# Patient Record
Sex: Female | Born: 1985 | Race: Black or African American | Hispanic: No | Marital: Single | Smoking: Never smoker
Health system: Southern US, Community
[De-identification: ages and names within clinical notes are randomized; demographics above are authoritative.]

## PROBLEM LIST (undated history)

## (undated) ENCOUNTER — Inpatient Hospital Stay: Payer: Self-pay

## (undated) DIAGNOSIS — T7840XA Allergy, unspecified, initial encounter: Secondary | ICD-10-CM

## (undated) DIAGNOSIS — K2970 Gastritis, unspecified, without bleeding: Secondary | ICD-10-CM

## (undated) DIAGNOSIS — I10 Essential (primary) hypertension: Secondary | ICD-10-CM

## (undated) HISTORY — PX: CHOLECYSTECTOMY: SHX55

## (undated) HISTORY — DX: Allergy, unspecified, initial encounter: T78.40XA

## (undated) HISTORY — PX: APPENDECTOMY: SHX54

---
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
## 2006-05-10 DIAGNOSIS — O1490 Unspecified pre-eclampsia, unspecified trimester: Secondary | ICD-10-CM

## 2006-10-21 ENCOUNTER — Inpatient Hospital Stay: Payer: Self-pay | Admitting: Surgery

## 2007-05-27 ENCOUNTER — Other Ambulatory Visit: Payer: Self-pay

## 2007-05-27 ENCOUNTER — Emergency Department: Payer: Self-pay | Admitting: Emergency Medicine

## 2007-12-12 ENCOUNTER — Emergency Department: Payer: Self-pay | Admitting: Emergency Medicine

## 2009-05-16 ENCOUNTER — Emergency Department: Payer: Self-pay | Admitting: Emergency Medicine

## 2009-08-08 ENCOUNTER — Emergency Department: Payer: Self-pay | Admitting: Internal Medicine

## 2009-08-17 ENCOUNTER — Emergency Department: Payer: Self-pay | Admitting: Emergency Medicine

## 2009-11-20 ENCOUNTER — Emergency Department: Payer: Self-pay | Admitting: Emergency Medicine

## 2010-01-13 ENCOUNTER — Emergency Department: Payer: Self-pay | Admitting: Unknown Physician Specialty

## 2010-03-24 ENCOUNTER — Emergency Department: Payer: Self-pay | Admitting: Emergency Medicine

## 2010-04-25 ENCOUNTER — Emergency Department: Payer: Self-pay | Admitting: Emergency Medicine

## 2010-05-20 ENCOUNTER — Emergency Department: Payer: Self-pay | Admitting: Emergency Medicine

## 2010-05-21 ENCOUNTER — Emergency Department: Payer: Self-pay | Admitting: Emergency Medicine

## 2010-05-24 ENCOUNTER — Emergency Department: Payer: Self-pay | Admitting: Emergency Medicine

## 2010-07-10 ENCOUNTER — Emergency Department: Payer: Self-pay | Admitting: Emergency Medicine

## 2010-07-25 ENCOUNTER — Emergency Department: Payer: Self-pay | Admitting: Unknown Physician Specialty

## 2010-07-30 ENCOUNTER — Emergency Department: Payer: Self-pay | Admitting: Emergency Medicine

## 2010-11-01 ENCOUNTER — Emergency Department: Payer: Self-pay | Admitting: Emergency Medicine

## 2011-01-14 ENCOUNTER — Emergency Department: Payer: Self-pay | Admitting: Emergency Medicine

## 2011-01-16 ENCOUNTER — Emergency Department: Payer: Self-pay | Admitting: Emergency Medicine

## 2011-02-16 ENCOUNTER — Emergency Department: Payer: Self-pay | Admitting: Emergency Medicine

## 2011-03-05 ENCOUNTER — Emergency Department: Payer: Self-pay | Admitting: *Deleted

## 2011-03-07 ENCOUNTER — Emergency Department: Payer: Self-pay | Admitting: Emergency Medicine

## 2011-03-08 ENCOUNTER — Emergency Department: Payer: Self-pay | Admitting: Emergency Medicine

## 2011-04-15 ENCOUNTER — Emergency Department: Payer: Self-pay | Admitting: Emergency Medicine

## 2011-05-06 ENCOUNTER — Emergency Department: Payer: Self-pay | Admitting: Emergency Medicine

## 2011-06-01 ENCOUNTER — Emergency Department: Payer: Self-pay | Admitting: *Deleted

## 2011-06-16 ENCOUNTER — Emergency Department: Payer: Self-pay | Admitting: Unknown Physician Specialty

## 2011-07-04 ENCOUNTER — Emergency Department: Payer: Self-pay | Admitting: Unknown Physician Specialty

## 2011-07-08 ENCOUNTER — Emergency Department: Payer: Self-pay | Admitting: *Deleted

## 2011-07-09 ENCOUNTER — Emergency Department: Payer: Self-pay | Admitting: Emergency Medicine

## 2011-07-13 ENCOUNTER — Observation Stay: Payer: Self-pay

## 2011-07-22 ENCOUNTER — Observation Stay: Payer: Self-pay

## 2011-08-02 ENCOUNTER — Emergency Department: Payer: Self-pay | Admitting: *Deleted

## 2011-08-02 LAB — CBC
HCT: 41.9 % (ref 35.0–47.0)
HGB: 14.6 g/dL (ref 12.0–16.0)
MCH: 30.7 pg (ref 26.0–34.0)
MCHC: 35 g/dL (ref 32.0–36.0)
MCV: 88 fL (ref 80–100)
Platelet: 305 10*3/uL (ref 150–440)
RBC: 4.78 10*6/uL (ref 3.80–5.20)
RDW: 13.1 % (ref 11.5–14.5)
WBC: 13.8 10*3/uL — ABNORMAL HIGH (ref 3.6–11.0)

## 2011-08-02 LAB — URINALYSIS, COMPLETE
Bilirubin,UR: NEGATIVE
Blood: NEGATIVE
Glucose,UR: NEGATIVE mg/dL (ref 0–75)
Ketone: NEGATIVE
Nitrite: NEGATIVE
Ph: 6 (ref 4.5–8.0)
Protein: NEGATIVE
RBC,UR: NONE SEEN /HPF (ref 0–5)
Specific Gravity: 1.013 (ref 1.003–1.030)
Squamous Epithelial: 30
WBC UR: 21 /HPF (ref 0–5)

## 2011-08-02 LAB — COMPREHENSIVE METABOLIC PANEL
Albumin: 3.7 g/dL (ref 3.4–5.0)
Alkaline Phosphatase: 47 U/L — ABNORMAL LOW (ref 50–136)
Anion Gap: 12 (ref 7–16)
BUN: 7 mg/dL (ref 7–18)
Bilirubin,Total: 0.3 mg/dL (ref 0.2–1.0)
Calcium, Total: 9.3 mg/dL (ref 8.5–10.1)
Chloride: 102 mmol/L (ref 98–107)
Co2: 25 mmol/L (ref 21–32)
Creatinine: 0.64 mg/dL (ref 0.60–1.30)
EGFR (African American): >60
EGFR (Non-African Amer.): >60
Glucose: 88 mg/dL (ref 65–99)
Osmolality: 275 (ref 275–301)
Potassium: 3.5 mmol/L (ref 3.5–5.1)
SGOT(AST): 45 U/L — ABNORMAL HIGH (ref 15–37)
SGPT (ALT): 44 U/L
Sodium: 139 mmol/L (ref 136–145)
Total Protein: 7.9 g/dL (ref 6.4–8.2)

## 2011-08-02 LAB — HCG, QUANTITATIVE, PREGNANCY: Beta Hcg, Quant.: 84413 m[IU]/mL — ABNORMAL HIGH

## 2011-08-09 ENCOUNTER — Inpatient Hospital Stay: Payer: Self-pay

## 2011-08-09 LAB — COMPREHENSIVE METABOLIC PANEL
Albumin: 3.2 g/dL — ABNORMAL LOW (ref 3.4–5.0)
Alkaline Phosphatase: 40 U/L — ABNORMAL LOW (ref 50–136)
Anion Gap: 11 (ref 7–16)
BUN: 7 mg/dL (ref 7–18)
Bilirubin,Total: 0.3 mg/dL (ref 0.2–1.0)
Calcium, Total: 8.7 mg/dL (ref 8.5–10.1)
Chloride: 101 mmol/L (ref 98–107)
Co2: 25 mmol/L (ref 21–32)
Creatinine: 0.41 mg/dL — ABNORMAL LOW (ref 0.60–1.30)
EGFR (African American): >60
EGFR (Non-African Amer.): >60
Glucose: 87 mg/dL (ref 65–99)
Osmolality: 271 (ref 275–301)
Potassium: 3.7 mmol/L (ref 3.5–5.1)
SGOT(AST): 18 U/L (ref 15–37)
SGPT (ALT): 24 U/L
Sodium: 137 mmol/L (ref 136–145)
Total Protein: 7.2 g/dL (ref 6.4–8.2)

## 2011-08-09 LAB — URINALYSIS, COMPLETE
Bacteria: NONE SEEN
Bilirubin,UR: NEGATIVE
Blood: NEGATIVE
Glucose,UR: NEGATIVE mg/dL (ref 0–75)
Nitrite: NEGATIVE
Ph: 6 (ref 4.5–8.0)
Protein: NEGATIVE
RBC,UR: 1 /HPF (ref 0–5)
Specific Gravity: 1.021 (ref 1.003–1.030)
Squamous Epithelial: 7
WBC UR: 4 /HPF (ref 0–5)

## 2011-08-10 LAB — TSH: Thyroid Stimulating Horm: 0.248 u[IU]/mL — ABNORMAL LOW

## 2011-08-10 LAB — T4, FREE: Free Thyroxine: 0.95 ng/dL (ref 0.76–1.46)

## 2011-08-12 LAB — URINALYSIS, COMPLETE
Bacteria: NONE SEEN
Bilirubin,UR: NEGATIVE
Blood: NEGATIVE
Glucose,UR: NEGATIVE mg/dL (ref 0–75)
Ketone: NEGATIVE
Nitrite: NEGATIVE
Ph: 7 (ref 4.5–8.0)
Protein: NEGATIVE
RBC,UR: 3 /HPF (ref 0–5)
Specific Gravity: 1.004 (ref 1.003–1.030)
Squamous Epithelial: 2
WBC UR: 2 /HPF (ref 0–5)

## 2011-08-25 ENCOUNTER — Emergency Department: Payer: Self-pay | Admitting: Emergency Medicine

## 2011-08-25 LAB — BASIC METABOLIC PANEL
Anion Gap: 12 (ref 7–16)
BUN: 5 mg/dL — ABNORMAL LOW (ref 7–18)
Calcium, Total: 8.8 mg/dL (ref 8.5–10.1)
Chloride: 105 mmol/L (ref 98–107)
Co2: 24 mmol/L (ref 21–32)
Creatinine: 0.56 mg/dL — ABNORMAL LOW (ref 0.60–1.30)
EGFR (African American): >60
EGFR (Non-African Amer.): >60
Glucose: 94 mg/dL (ref 65–99)
Osmolality: 278 (ref 275–301)
Potassium: 3.5 mmol/L (ref 3.5–5.1)
Sodium: 141 mmol/L (ref 136–145)

## 2011-08-25 LAB — CBC
HCT: 35.5 % (ref 35.0–47.0)
HGB: 12.2 g/dL (ref 12.0–16.0)
MCH: 30.1 pg (ref 26.0–34.0)
MCHC: 34.5 g/dL (ref 32.0–36.0)
MCV: 87 fL (ref 80–100)
Platelet: 276 10*3/uL (ref 150–440)
RBC: 4.06 10*6/uL (ref 3.80–5.20)
RDW: 12.8 % (ref 11.5–14.5)
WBC: 9.5 10*3/uL (ref 3.6–11.0)

## 2011-08-25 LAB — URINALYSIS, COMPLETE
Bacteria: NONE SEEN
Bilirubin,UR: NEGATIVE
Blood: NEGATIVE
Glucose,UR: 50 mg/dL (ref 0–75)
Nitrite: NEGATIVE
Ph: 5 (ref 4.5–8.0)
Protein: NEGATIVE
RBC,UR: 1 /HPF (ref 0–5)
Specific Gravity: 1.021 (ref 1.003–1.030)
Squamous Epithelial: 2
WBC UR: 2 /HPF (ref 0–5)

## 2011-08-25 LAB — HCG, QUANTITATIVE, PREGNANCY: Beta Hcg, Quant.: 56880 m[IU]/mL — ABNORMAL HIGH

## 2011-09-03 ENCOUNTER — Emergency Department: Payer: Self-pay | Admitting: Emergency Medicine

## 2011-09-03 LAB — COMPREHENSIVE METABOLIC PANEL
Albumin: 3.3 g/dL — ABNORMAL LOW (ref 3.4–5.0)
Alkaline Phosphatase: 39 U/L — ABNORMAL LOW (ref 50–136)
Anion Gap: 10 (ref 7–16)
BUN: 8 mg/dL (ref 7–18)
Bilirubin,Total: 0.2 mg/dL (ref 0.2–1.0)
Calcium, Total: 9.1 mg/dL (ref 8.5–10.1)
Chloride: 102 mmol/L (ref 98–107)
Co2: 26 mmol/L (ref 21–32)
Creatinine: 0.5 mg/dL — ABNORMAL LOW (ref 0.60–1.30)
EGFR (African American): >60
EGFR (Non-African Amer.): >60
Glucose: 83 mg/dL (ref 65–99)
Osmolality: 273 (ref 275–301)
Potassium: 3.8 mmol/L (ref 3.5–5.1)
SGOT(AST): 13 U/L — ABNORMAL LOW (ref 15–37)
SGPT (ALT): 16 U/L
Sodium: 138 mmol/L (ref 136–145)
Total Protein: 7.4 g/dL (ref 6.4–8.2)

## 2011-09-03 LAB — URINALYSIS, COMPLETE
Bilirubin,UR: NEGATIVE
Blood: NEGATIVE
Glucose,UR: NEGATIVE mg/dL (ref 0–75)
Nitrite: NEGATIVE
Ph: 7 (ref 4.5–8.0)
Protein: NEGATIVE
RBC,UR: 3 /HPF (ref 0–5)
Specific Gravity: 1.016 (ref 1.003–1.030)
Squamous Epithelial: 6
WBC UR: 3 /HPF (ref 0–5)

## 2011-09-03 LAB — PREGNANCY, URINE: Pregnancy Test, Urine: POSITIVE m[IU]/mL

## 2011-09-03 LAB — CBC
HCT: 37.6 % (ref 35.0–47.0)
HGB: 12.9 g/dL (ref 12.0–16.0)
MCH: 30.2 pg (ref 26.0–34.0)
MCHC: 34.4 g/dL (ref 32.0–36.0)
MCV: 88 fL (ref 80–100)
Platelet: 257 10*3/uL (ref 150–440)
RBC: 4.27 10*6/uL (ref 3.80–5.20)
RDW: 13 % (ref 11.5–14.5)
WBC: 13.1 10*3/uL — ABNORMAL HIGH (ref 3.6–11.0)

## 2011-09-03 LAB — LIPASE, BLOOD: Lipase: 166 U/L (ref 73–393)

## 2011-09-03 LAB — HCG, QUANTITATIVE, PREGNANCY: Beta Hcg, Quant.: 40964 m[IU]/mL — ABNORMAL HIGH

## 2011-09-09 ENCOUNTER — Emergency Department: Payer: Self-pay | Admitting: Emergency Medicine

## 2011-09-25 ENCOUNTER — Emergency Department: Payer: Self-pay | Admitting: Emergency Medicine

## 2011-09-25 LAB — URINALYSIS, COMPLETE
Bacteria: NONE SEEN
Bilirubin,UR: NEGATIVE
Blood: NEGATIVE
Glucose,UR: NEGATIVE mg/dL (ref 0–75)
Ketone: NEGATIVE
Nitrite: NEGATIVE
Ph: 6 (ref 4.5–8.0)
Protein: NEGATIVE
RBC,UR: 1 /HPF (ref 0–5)
Specific Gravity: 1.021 (ref 1.003–1.030)
Squamous Epithelial: 3
WBC UR: 2 /HPF (ref 0–5)

## 2011-09-25 LAB — COMPREHENSIVE METABOLIC PANEL
Albumin: 3.1 g/dL — ABNORMAL LOW (ref 3.4–5.0)
Alkaline Phosphatase: 50 U/L (ref 50–136)
Anion Gap: 8 (ref 7–16)
BUN: 7 mg/dL (ref 7–18)
Bilirubin,Total: 0.3 mg/dL (ref 0.2–1.0)
Calcium, Total: 9 mg/dL (ref 8.5–10.1)
Chloride: 102 mmol/L (ref 98–107)
Co2: 27 mmol/L (ref 21–32)
Creatinine: 0.47 mg/dL — ABNORMAL LOW (ref 0.60–1.30)
EGFR (African American): >60
EGFR (Non-African Amer.): >60
Glucose: 79 mg/dL (ref 65–99)
Osmolality: 271 (ref 275–301)
Potassium: 4 mmol/L (ref 3.5–5.1)
SGOT(AST): 16 U/L (ref 15–37)
SGPT (ALT): 21 U/L
Sodium: 137 mmol/L (ref 136–145)
Total Protein: 7.2 g/dL (ref 6.4–8.2)

## 2011-09-25 LAB — CBC
HCT: 36.1 % (ref 35.0–47.0)
HGB: 12.3 g/dL (ref 12.0–16.0)
MCH: 30 pg (ref 26.0–34.0)
MCHC: 34 g/dL (ref 32.0–36.0)
MCV: 88 fL (ref 80–100)
Platelet: 219 10*3/uL (ref 150–440)
RBC: 4.09 10*6/uL (ref 3.80–5.20)
RDW: 13.1 % (ref 11.5–14.5)
WBC: 10.5 10*3/uL (ref 3.6–11.0)

## 2011-09-25 LAB — LIPASE, BLOOD: Lipase: 139 U/L (ref 73–393)

## 2011-09-27 LAB — URINE CULTURE

## 2011-09-29 ENCOUNTER — Emergency Department: Payer: Self-pay | Admitting: Emergency Medicine

## 2011-09-29 ENCOUNTER — Observation Stay: Payer: Self-pay | Admitting: Internal Medicine

## 2011-09-29 LAB — RAPID INFLUENZA A&B ANTIGENS

## 2011-10-29 ENCOUNTER — Observation Stay: Payer: Self-pay | Admitting: Obstetrics and Gynecology

## 2011-10-29 LAB — URINALYSIS, COMPLETE
Bilirubin,UR: NEGATIVE
Blood: NEGATIVE
Glucose,UR: NEGATIVE mg/dL (ref 0–75)
Ketone: NEGATIVE
Nitrite: NEGATIVE
Ph: 6 (ref 4.5–8.0)
Protein: NEGATIVE
RBC,UR: 4 /HPF (ref 0–5)
Specific Gravity: 1.018 (ref 1.003–1.030)
Squamous Epithelial: 11
WBC UR: 3 /HPF (ref 0–5)

## 2011-10-31 LAB — URINE CULTURE

## 2011-11-19 ENCOUNTER — Observation Stay: Payer: Self-pay

## 2011-11-19 ENCOUNTER — Observation Stay: Payer: Self-pay | Admitting: Obstetrics and Gynecology

## 2011-11-19 LAB — BASIC METABOLIC PANEL
Anion Gap: 9 (ref 7–16)
BUN: 6 mg/dL — ABNORMAL LOW (ref 7–18)
Calcium, Total: 8.2 mg/dL — ABNORMAL LOW (ref 8.5–10.1)
Chloride: 107 mmol/L (ref 98–107)
Co2: 25 mmol/L (ref 21–32)
Creatinine: 0.46 mg/dL — ABNORMAL LOW (ref 0.60–1.30)
EGFR (African American): >60
EGFR (Non-African Amer.): >60
Glucose: 86 mg/dL (ref 65–99)
Osmolality: 278 (ref 275–301)
Potassium: 3.2 mmol/L — ABNORMAL LOW (ref 3.5–5.1)
Sodium: 141 mmol/L (ref 136–145)

## 2011-11-19 LAB — URINALYSIS, COMPLETE
Bilirubin,UR: NEGATIVE
Blood: NEGATIVE
Glucose,UR: NEGATIVE mg/dL (ref 0–75)
Ketone: NEGATIVE
Nitrite: NEGATIVE
Ph: 6 (ref 4.5–8.0)
Protein: NEGATIVE
RBC,UR: 4 /HPF (ref 0–5)
Specific Gravity: 1.013 (ref 1.003–1.030)
Squamous Epithelial: 4
WBC UR: 5 /HPF (ref 0–5)

## 2011-11-19 LAB — FETAL FIBRONECTIN
Appearance: NORMAL
Fetal Fibronectin: NEGATIVE

## 2011-11-20 ENCOUNTER — Ambulatory Visit: Payer: Self-pay

## 2011-11-20 LAB — COMPREHENSIVE METABOLIC PANEL
Albumin: 2.7 g/dL — ABNORMAL LOW (ref 3.4–5.0)
Alkaline Phosphatase: 60 U/L (ref 50–136)
Anion Gap: 7 (ref 7–16)
BUN: 3 mg/dL — ABNORMAL LOW (ref 7–18)
Bilirubin,Total: 0.2 mg/dL (ref 0.2–1.0)
Calcium, Total: 8.7 mg/dL (ref 8.5–10.1)
Chloride: 107 mmol/L (ref 98–107)
Co2: 25 mmol/L (ref 21–32)
Creatinine: 0.42 mg/dL — ABNORMAL LOW (ref 0.60–1.30)
EGFR (African American): >60
EGFR (Non-African Amer.): >60
Glucose: 113 mg/dL — ABNORMAL HIGH (ref 65–99)
Osmolality: 275 (ref 275–301)
Potassium: 4.3 mmol/L (ref 3.5–5.1)
SGOT(AST): 17 U/L (ref 15–37)
SGPT (ALT): 17 U/L
Sodium: 139 mmol/L (ref 136–145)
Total Protein: 6.9 g/dL (ref 6.4–8.2)

## 2011-11-20 LAB — URINE CULTURE

## 2011-11-21 ENCOUNTER — Observation Stay: Payer: Self-pay | Admitting: Obstetrics and Gynecology

## 2011-12-05 ENCOUNTER — Observation Stay: Payer: Self-pay

## 2011-12-05 LAB — URINALYSIS, COMPLETE
Bacteria: NONE SEEN
Bilirubin,UR: NEGATIVE
Blood: NEGATIVE
Glucose,UR: NEGATIVE mg/dL (ref 0–75)
Ketone: NEGATIVE
Leukocyte Esterase: NEGATIVE
Nitrite: NEGATIVE
Ph: 8 (ref 4.5–8.0)
Protein: NEGATIVE
RBC,UR: NONE SEEN /HPF (ref 0–5)
Specific Gravity: 1.019 (ref 1.003–1.030)
Squamous Epithelial: NONE SEEN
WBC UR: <1 /HPF (ref 0–5)

## 2011-12-05 LAB — CBC WITH DIFFERENTIAL/PLATELET
Basophil #: 0 10*3/uL (ref 0.0–0.1)
Basophil %: 0.3 %
Eosinophil #: 0.1 10*3/uL (ref 0.0–0.7)
Eosinophil %: 0.9 %
HCT: 30.5 % — ABNORMAL LOW (ref 35.0–47.0)
HGB: 10.3 g/dL — ABNORMAL LOW (ref 12.0–16.0)
Lymphocyte #: 1.2 10*3/uL (ref 1.0–3.6)
Lymphocyte %: 11.9 %
MCH: 28.2 pg (ref 26.0–34.0)
MCHC: 33.6 g/dL (ref 32.0–36.0)
MCV: 84 fL (ref 80–100)
Monocyte #: 0.7 x10 3/mm (ref 0.2–0.9)
Monocyte %: 6.7 %
Neutrophil #: 8.4 10*3/uL — ABNORMAL HIGH (ref 1.4–6.5)
Neutrophil %: 80.2 %
Platelet: 185 10*3/uL (ref 150–440)
RBC: 3.64 10*6/uL — ABNORMAL LOW (ref 3.80–5.20)
RDW: 14 % (ref 11.5–14.5)
WBC: 10.5 10*3/uL (ref 3.6–11.0)

## 2011-12-07 LAB — URINE CULTURE

## 2011-12-24 ENCOUNTER — Emergency Department: Payer: Self-pay | Admitting: Emergency Medicine

## 2012-01-01 ENCOUNTER — Observation Stay: Payer: Self-pay

## 2012-01-01 LAB — URINALYSIS, COMPLETE
Bacteria: NONE SEEN
Bilirubin,UR: NEGATIVE
Blood: NEGATIVE
Glucose,UR: NEGATIVE mg/dL (ref 0–75)
Ketone: NEGATIVE
Leukocyte Esterase: NEGATIVE
Nitrite: NEGATIVE
Ph: 7 (ref 4.5–8.0)
Protein: NEGATIVE
RBC,UR: <1 /HPF (ref 0–5)
Specific Gravity: 1.024 (ref 1.003–1.030)
Squamous Epithelial: 1
WBC UR: <1 /HPF (ref 0–5)

## 2012-01-04 LAB — URINE CULTURE

## 2012-01-16 ENCOUNTER — Observation Stay: Payer: Self-pay

## 2012-01-16 LAB — URINALYSIS, COMPLETE
Bilirubin,UR: NEGATIVE
Blood: NEGATIVE
Glucose,UR: NEGATIVE mg/dL (ref 0–75)
Ketone: NEGATIVE
Nitrite: NEGATIVE
Ph: 8 (ref 4.5–8.0)
Protein: NEGATIVE
RBC,UR: <1 /HPF (ref 0–5)
Specific Gravity: 1.005 (ref 1.003–1.030)
Squamous Epithelial: 2
WBC UR: <1 /HPF (ref 0–5)

## 2012-01-25 ENCOUNTER — Observation Stay: Payer: Self-pay | Admitting: Obstetrics and Gynecology

## 2012-07-14 ENCOUNTER — Emergency Department: Payer: Self-pay | Admitting: Emergency Medicine

## 2012-08-03 ENCOUNTER — Ambulatory Visit: Payer: Self-pay | Admitting: Family Medicine

## 2012-08-11 ENCOUNTER — Ambulatory Visit: Payer: Self-pay | Admitting: Emergency Medicine

## 2012-08-11 LAB — CBC WITH DIFFERENTIAL/PLATELET
Basophil #: 0 10*3/uL (ref 0.0–0.1)
Basophil %: 0.7 %
Eosinophil #: 0.1 10*3/uL (ref 0.0–0.7)
Eosinophil %: 0.9 %
HCT: 41.5 % (ref 35.0–47.0)
HGB: 14.1 g/dL (ref 12.0–16.0)
Lymphocyte #: 1.9 10*3/uL (ref 1.0–3.6)
Lymphocyte %: 32.4 %
MCH: 29.1 pg (ref 26.0–34.0)
MCHC: 33.9 g/dL (ref 32.0–36.0)
MCV: 86 fL (ref 80–100)
Monocyte #: 0.6 x10 3/mm (ref 0.2–0.9)
Monocyte %: 9.8 %
Neutrophil #: 3.3 10*3/uL (ref 1.4–6.5)
Neutrophil %: 56.2 %
Platelet: 298 10*3/uL (ref 150–440)
RBC: 4.84 10*6/uL (ref 3.80–5.20)
RDW: 13.1 % (ref 11.5–14.5)
WBC: 6 10*3/uL (ref 3.6–11.0)

## 2012-08-11 LAB — HEPATIC FUNCTION PANEL A (ARMC)
Albumin: 4.1 g/dL (ref 3.4–5.0)
Alkaline Phosphatase: 58 U/L (ref 50–136)
Bilirubin, Direct: <0.1 mg/dL (ref 0.00–0.20)
Bilirubin,Total: 0.4 mg/dL (ref 0.2–1.0)
SGOT(AST): 15 U/L (ref 15–37)
SGPT (ALT): 16 U/L (ref 12–78)
Total Protein: 7.9 g/dL (ref 6.4–8.2)

## 2012-08-13 LAB — PATHOLOGY REPORT

## 2012-09-07 ENCOUNTER — Emergency Department: Payer: Self-pay | Admitting: Emergency Medicine

## 2012-09-07 LAB — COMPREHENSIVE METABOLIC PANEL
Albumin: 4 g/dL (ref 3.4–5.0)
Alkaline Phosphatase: 69 U/L (ref 50–136)
Anion Gap: 6 — ABNORMAL LOW (ref 7–16)
BUN: 13 mg/dL (ref 7–18)
Bilirubin,Total: 0.4 mg/dL (ref 0.2–1.0)
Calcium, Total: 9.1 mg/dL (ref 8.5–10.1)
Chloride: 107 mmol/L (ref 98–107)
Co2: 26 mmol/L (ref 21–32)
Creatinine: 0.67 mg/dL (ref 0.60–1.30)
EGFR (African American): >60
EGFR (Non-African Amer.): >60
Glucose: 78 mg/dL (ref 65–99)
Osmolality: 277 (ref 275–301)
Potassium: 3.5 mmol/L (ref 3.5–5.1)
SGOT(AST): 21 U/L (ref 15–37)
SGPT (ALT): 28 U/L (ref 12–78)
Sodium: 139 mmol/L (ref 136–145)
Total Protein: 8.3 g/dL — ABNORMAL HIGH (ref 6.4–8.2)

## 2012-09-07 LAB — URINALYSIS, COMPLETE
Bilirubin,UR: NEGATIVE
Blood: NEGATIVE
Glucose,UR: NEGATIVE mg/dL (ref 0–75)
Ketone: NEGATIVE
Nitrite: NEGATIVE
Ph: 6 (ref 4.5–8.0)
Protein: NEGATIVE
RBC,UR: 1 /HPF (ref 0–5)
Specific Gravity: 1.024 (ref 1.003–1.030)
Squamous Epithelial: 22
WBC UR: 5 /HPF (ref 0–5)

## 2012-09-07 LAB — CBC
HCT: 43.2 %
HGB: 14.5 g/dL
MCH: 28.9 pg
MCHC: 33.6 g/dL
MCV: 86 fL
Platelet: 339 x10 3/mm 3
RBC: 5.02 X10 6/mm 3
RDW: 13.5 %
WBC: 8 x10 3/mm 3

## 2012-09-07 LAB — PREGNANCY, URINE: Pregnancy Test, Urine: NEGATIVE m[IU]/mL

## 2012-09-07 LAB — LIPASE, BLOOD: Lipase: 179 U/L (ref 73–393)

## 2012-09-07 LAB — WET PREP, GENITAL

## 2012-10-07 ENCOUNTER — Emergency Department: Payer: Self-pay | Admitting: Emergency Medicine

## 2012-10-07 LAB — COMPREHENSIVE METABOLIC PANEL
Albumin: 3.7 g/dL (ref 3.4–5.0)
Alkaline Phosphatase: 70 U/L (ref 50–136)
Anion Gap: 7 (ref 7–16)
BUN: 14 mg/dL (ref 7–18)
Bilirubin,Total: 0.2 mg/dL (ref 0.2–1.0)
Calcium, Total: 8.8 mg/dL (ref 8.5–10.1)
Chloride: 107 mmol/L (ref 98–107)
Co2: 26 mmol/L (ref 21–32)
Creatinine: 0.52 mg/dL — ABNORMAL LOW (ref 0.60–1.30)
EGFR (African American): >60
EGFR (Non-African Amer.): >60
Glucose: 95 mg/dL (ref 65–99)
Osmolality: 280 (ref 275–301)
Potassium: 3.5 mmol/L (ref 3.5–5.1)
SGOT(AST): 24 U/L (ref 15–37)
SGPT (ALT): 26 U/L (ref 12–78)
Sodium: 140 mmol/L (ref 136–145)
Total Protein: 7.7 g/dL (ref 6.4–8.2)

## 2012-10-07 LAB — CBC
HCT: 40.4 % (ref 35.0–47.0)
HGB: 14.1 g/dL (ref 12.0–16.0)
MCH: 30 pg (ref 26.0–34.0)
MCHC: 34.8 g/dL (ref 32.0–36.0)
MCV: 86 fL (ref 80–100)
Platelet: 291 10*3/uL (ref 150–440)
RBC: 4.69 10*6/uL (ref 3.80–5.20)
RDW: 13.3 % (ref 11.5–14.5)
WBC: 9.4 10*3/uL (ref 3.6–11.0)

## 2012-10-07 LAB — URINALYSIS, COMPLETE
Bilirubin,UR: NEGATIVE
Blood: NEGATIVE
Glucose,UR: NEGATIVE mg/dL (ref 0–75)
Ketone: NEGATIVE
Nitrite: NEGATIVE
Ph: 6 (ref 4.5–8.0)
Protein: NEGATIVE
RBC,UR: 1 /HPF (ref 0–5)
Specific Gravity: 1.027 (ref 1.003–1.030)
Squamous Epithelial: 4
WBC UR: 4 /HPF (ref 0–5)

## 2012-10-07 LAB — PREGNANCY, URINE: Pregnancy Test, Urine: NEGATIVE m[IU]/mL

## 2012-10-07 LAB — LIPASE, BLOOD: Lipase: 159 U/L (ref 73–393)

## 2013-04-20 ENCOUNTER — Emergency Department: Payer: Self-pay | Admitting: Emergency Medicine

## 2013-04-20 LAB — CBC
HCT: 41.9 % (ref 35.0–47.0)
HGB: 14.5 g/dL (ref 12.0–16.0)
MCH: 30.4 pg (ref 26.0–34.0)
MCHC: 34.7 g/dL (ref 32.0–36.0)
MCV: 88 fL (ref 80–100)
Platelet: 299 10*3/uL (ref 150–440)
RBC: 4.78 10*6/uL (ref 3.80–5.20)
RDW: 13.3 % (ref 11.5–14.5)
WBC: 7.9 10*3/uL (ref 3.6–11.0)

## 2013-04-20 LAB — URINALYSIS, COMPLETE
Bilirubin,UR: NEGATIVE
Blood: NEGATIVE
Glucose,UR: NEGATIVE mg/dL (ref 0–75)
Ketone: NEGATIVE
Leukocyte Esterase: NEGATIVE
Nitrite: NEGATIVE
Ph: 6 (ref 4.5–8.0)
Protein: NEGATIVE
RBC,UR: 1 /HPF (ref 0–5)
Specific Gravity: 1.014 (ref 1.003–1.030)
Squamous Epithelial: 2
WBC UR: 2 /HPF (ref 0–5)

## 2013-04-20 LAB — BASIC METABOLIC PANEL
Anion Gap: 5 — ABNORMAL LOW (ref 7–16)
BUN: 10 mg/dL (ref 7–18)
Calcium, Total: 8.5 mg/dL (ref 8.5–10.1)
Chloride: 106 mmol/L (ref 98–107)
Co2: 27 mmol/L (ref 21–32)
Creatinine: 0.72 mg/dL (ref 0.60–1.30)
EGFR (African American): >60
EGFR (Non-African Amer.): >60
Glucose: 107 mg/dL — ABNORMAL HIGH (ref 65–99)
Osmolality: 275 (ref 275–301)
Potassium: 3.9 mmol/L (ref 3.5–5.1)
Sodium: 138 mmol/L (ref 136–145)

## 2013-04-24 ENCOUNTER — Emergency Department: Payer: Self-pay | Admitting: Emergency Medicine

## 2013-07-04 IMAGING — US US OB < 14 WEEKS - US OB TV
1 series · 13 of 28 positions shown · non-contrast
Comparison: none

REASON FOR EXAM: 7 weeks pregnant, pelvic pain
COMMENTS:   LMP: 05/12/11

PROCEDURE:     US  - US OB LESS THAN 14 WEEKS/W TRANS  - July 04, 2011 [DATE]
RESULT:     Comparison: None.
TECHNIQUE: Multiple grayscale and color Doppler images obtained of the
pelvis via transabdominal and endovaginal ultrasound.

[Series 1: us ob less than 14 weeks/w trans · 13 of 34 slices shown]
[im 2/34]
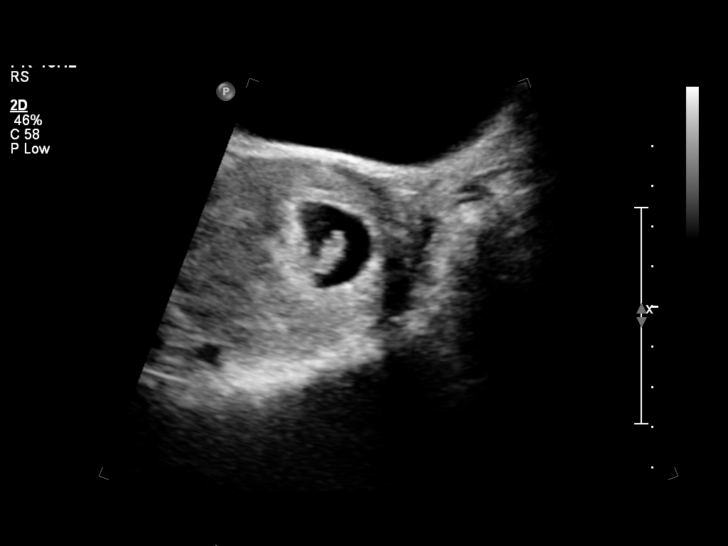
[im 4/34]
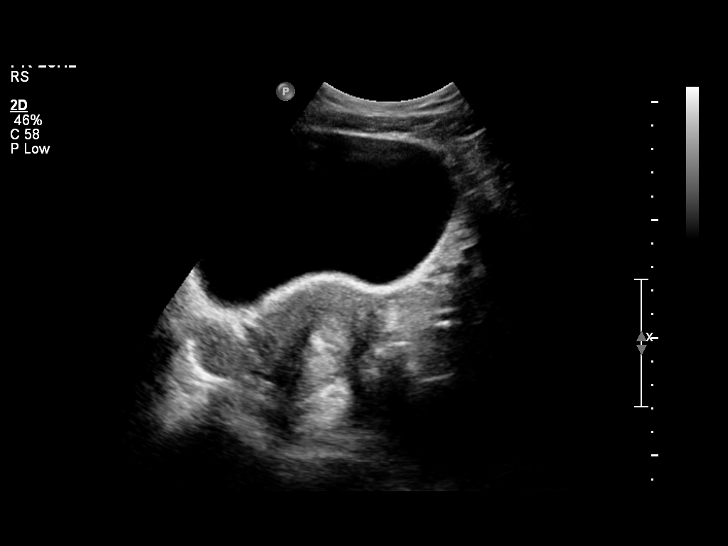
[im 7/34]
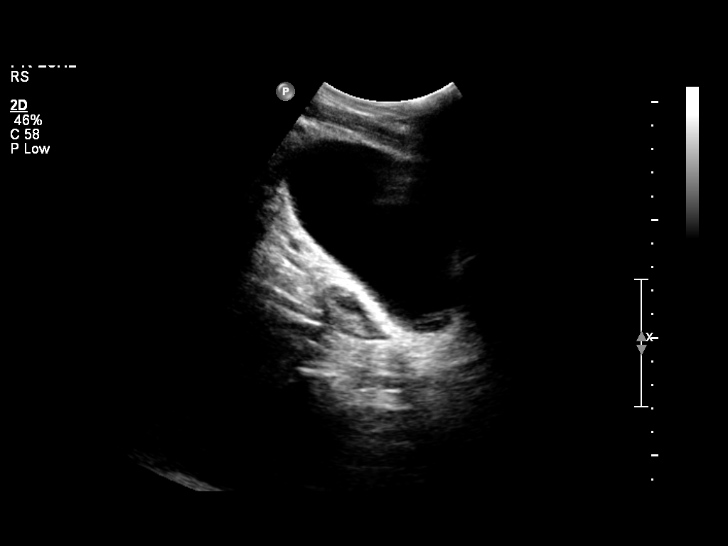
[im 9/34]
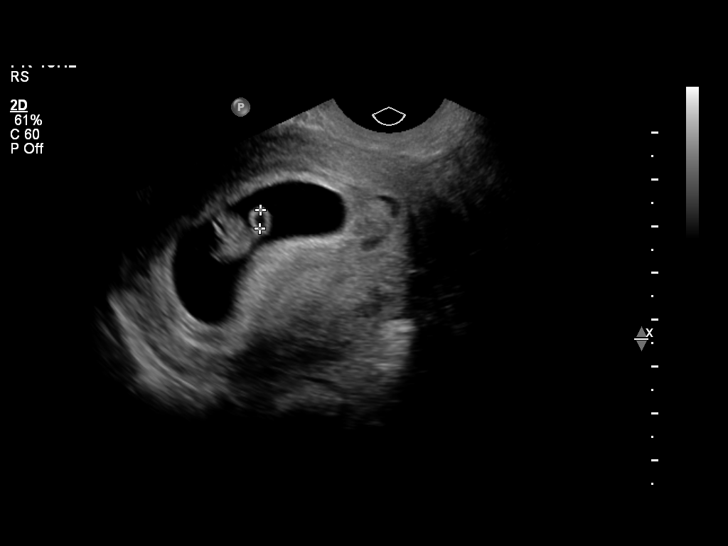
[im 12/34]
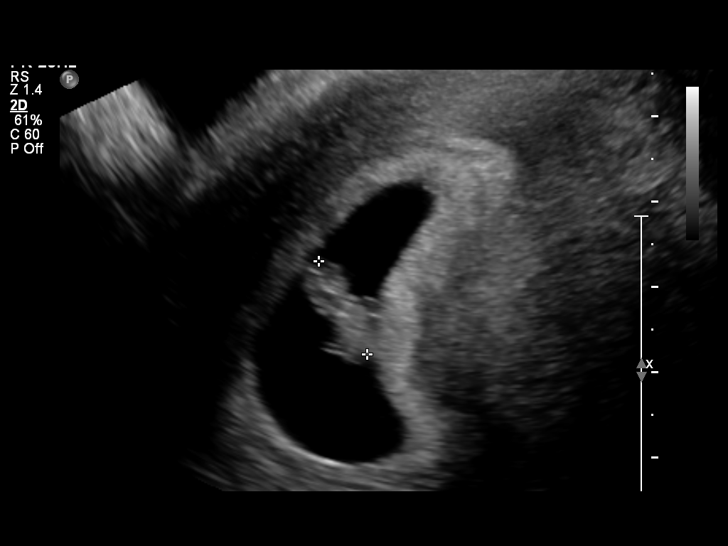
[im 14/34]
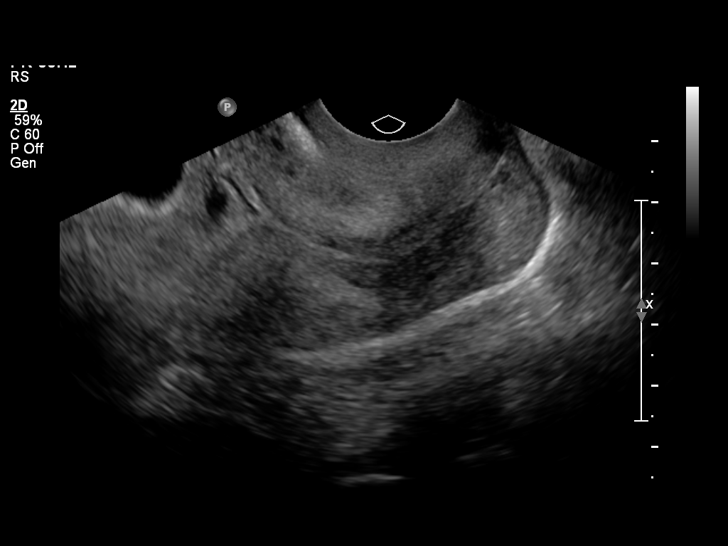
[im 18/34]
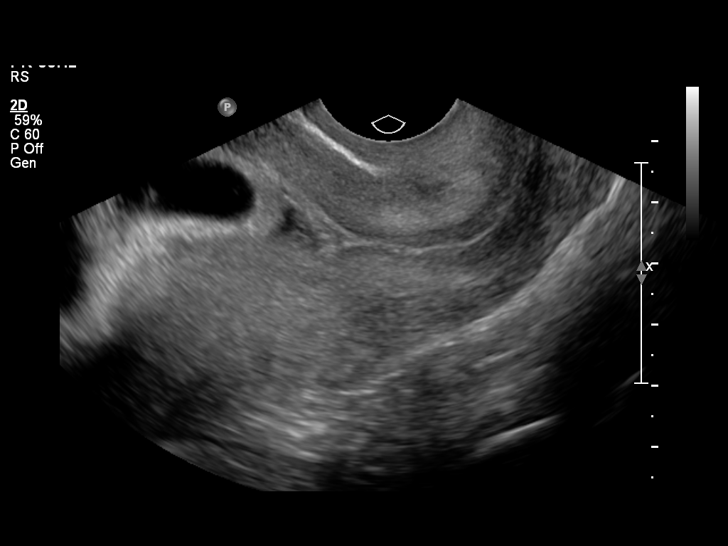
[im 20/34]
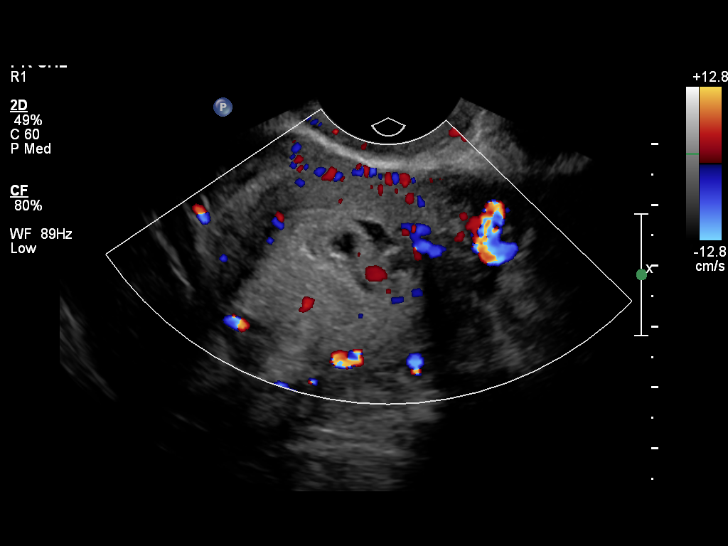
[im 23/34]
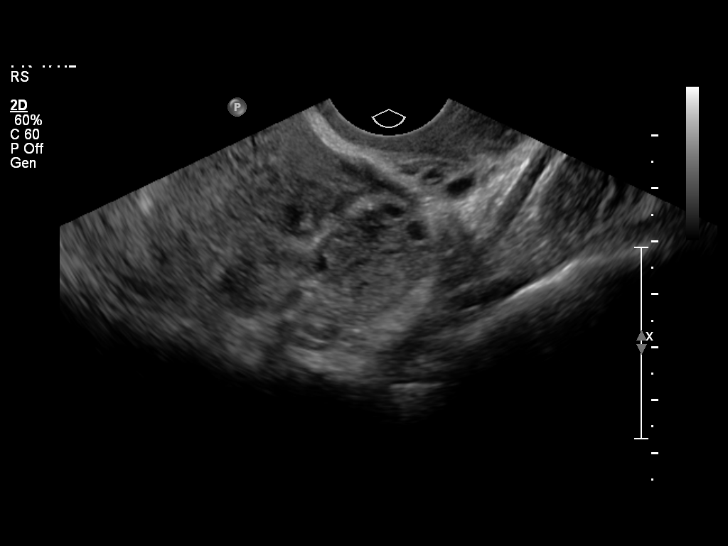
[im 25/34]
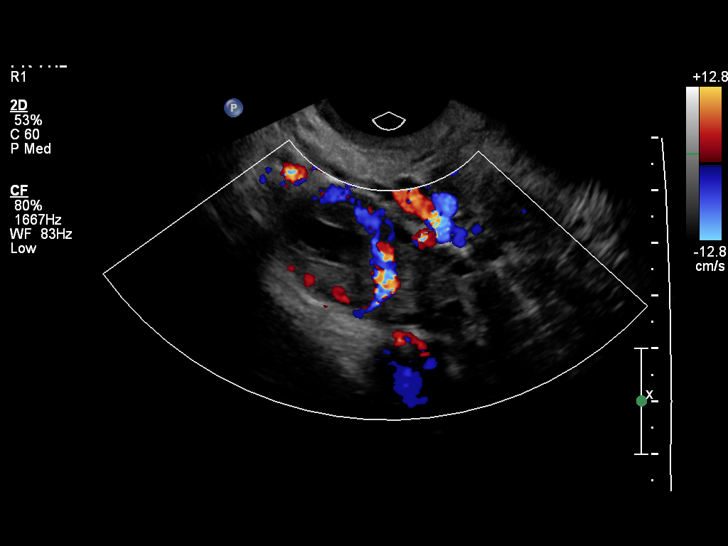
[im 27/34]
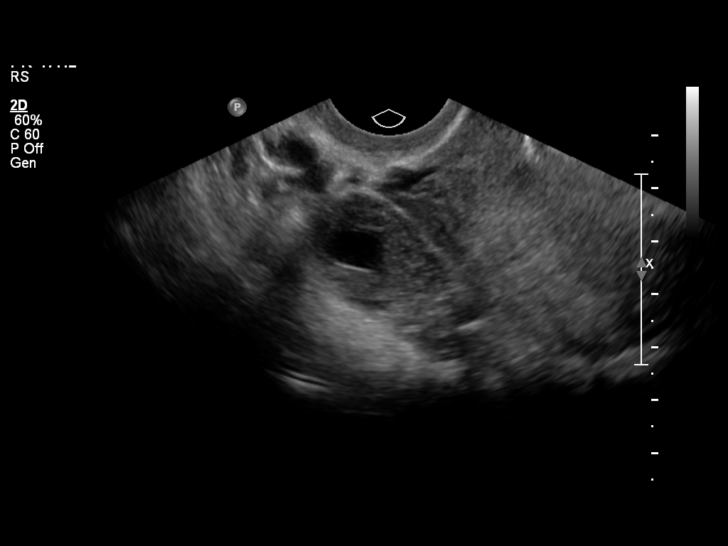
[im 30/34]
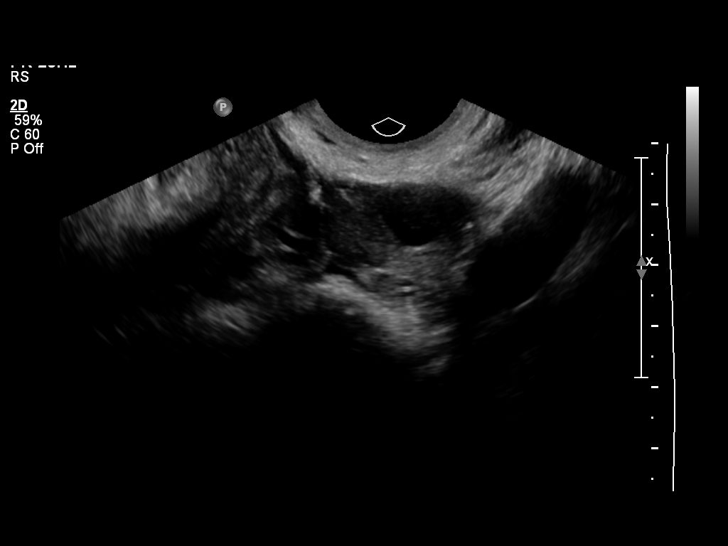
[im 32/34]
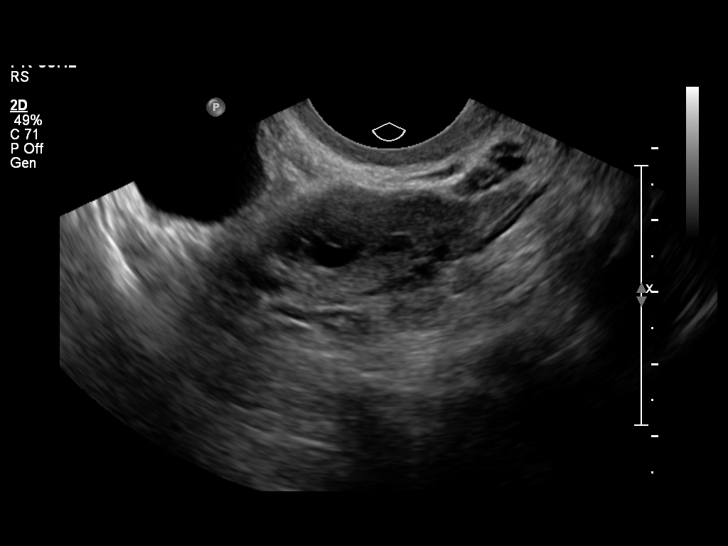

[13 of 28 positions shown; findings below may reference images not displayed]

FINDINGS: There is a gestational sac within the endometrial canal. A fetal pole is
identified, with crown-rump length of 1.2 cm. This correlates with estimated
gestational age of 7 weeks 3 days. The fetal heart rate was 157 beats per
minute. A yolk sac is present. There is a small area of hypoechogenicity
along the inferior margin of the gestational sac. This could represent a
small subchorionic hematoma.

The right ovary measures 4.1 x 2.0 x 2.0 cm. The left ovary measures 2.7 x
1.5 x 2.3 cm. There is a complex cyst in the right ovary which measures
x 2.1 x 2.2 cm. This may represent a corpus luteum cyst. Color Doppler flow
is associated with the bilateral ovaries. Spectral Doppler imaging was not
performed. No adnexal mass identified.
IMPRESSION: 1. Single live intrauterine pregnancy with estimated gestational age of 7
weeks 3 days.
2. Possible small subchorionic hematoma. Followup ultrasound is recommended.
3. Likely corpus luteum cyst in the right ovary. Recommend attention on
followup pelvic ultrasound.

## 2013-09-03 ENCOUNTER — Emergency Department: Payer: Self-pay | Admitting: Emergency Medicine

## 2013-09-03 LAB — COMPREHENSIVE METABOLIC PANEL
Albumin: 3.8 g/dL (ref 3.4–5.0)
Alkaline Phosphatase: 64 U/L
Anion Gap: 5 — ABNORMAL LOW (ref 7–16)
BUN: 8 mg/dL (ref 7–18)
Bilirubin,Total: 0.5 mg/dL (ref 0.2–1.0)
Calcium, Total: 8.5 mg/dL (ref 8.5–10.1)
Chloride: 105 mmol/L (ref 98–107)
Co2: 26 mmol/L (ref 21–32)
Creatinine: 0.57 mg/dL — ABNORMAL LOW (ref 0.60–1.30)
EGFR (African American): >60
EGFR (Non-African Amer.): >60
Glucose: 92 mg/dL (ref 65–99)
Osmolality: 270 (ref 275–301)
Potassium: 3.8 mmol/L (ref 3.5–5.1)
SGOT(AST): 12 U/L — ABNORMAL LOW (ref 15–37)
SGPT (ALT): 19 U/L (ref 12–78)
Sodium: 136 mmol/L (ref 136–145)
Total Protein: 8 g/dL (ref 6.4–8.2)

## 2013-09-03 LAB — CBC WITH DIFFERENTIAL/PLATELET
Basophil #: 0 10*3/uL (ref 0.0–0.1)
Basophil %: 0.2 %
Eosinophil #: 0.1 10*3/uL (ref 0.0–0.7)
Eosinophil %: 1.2 %
HCT: 43.8 % (ref 35.0–47.0)
HGB: 15.2 g/dL (ref 12.0–16.0)
Lymphocyte #: 1.4 10*3/uL (ref 1.0–3.6)
Lymphocyte %: 13.2 %
MCH: 30.9 pg (ref 26.0–34.0)
MCHC: 34.7 g/dL (ref 32.0–36.0)
MCV: 89 fL (ref 80–100)
Monocyte #: 1.1 x10 3/mm — ABNORMAL HIGH (ref 0.2–0.9)
Monocyte %: 10.8 %
Neutrophil #: 7.9 10*3/uL — ABNORMAL HIGH (ref 1.4–6.5)
Neutrophil %: 74.6 %
Platelet: 267 10*3/uL (ref 150–440)
RBC: 4.91 10*6/uL (ref 3.80–5.20)
RDW: 13.3 % (ref 11.5–14.5)
WBC: 10.5 10*3/uL (ref 3.6–11.0)

## 2013-09-03 LAB — URINALYSIS, COMPLETE
Bilirubin,UR: NEGATIVE
Blood: NEGATIVE
Glucose,UR: NEGATIVE mg/dL (ref 0–75)
Ketone: NEGATIVE
Nitrite: NEGATIVE
Ph: 5 (ref 4.5–8.0)
Protein: NEGATIVE
RBC,UR: 1 /HPF (ref 0–5)
Specific Gravity: 1.021 (ref 1.003–1.030)
Squamous Epithelial: 2
WBC UR: 2 /HPF (ref 0–5)

## 2014-07-21 ENCOUNTER — Emergency Department: Payer: Self-pay | Admitting: Internal Medicine

## 2014-07-21 LAB — CBC
HCT: 43.6 % (ref 35.0–47.0)
HGB: 14.3 g/dL (ref 12.0–16.0)
MCH: 29.7 pg (ref 26.0–34.0)
MCHC: 32.9 g/dL (ref 32.0–36.0)
MCV: 90 fL (ref 80–100)
Platelet: 278 10*3/uL (ref 150–440)
RBC: 4.83 10*6/uL (ref 3.80–5.20)
RDW: 13 % (ref 11.5–14.5)
WBC: 11.5 10*3/uL — ABNORMAL HIGH (ref 3.6–11.0)

## 2014-07-21 LAB — COMPREHENSIVE METABOLIC PANEL
Albumin: 3.6 g/dL (ref 3.4–5.0)
Alkaline Phosphatase: 56 U/L
Anion Gap: 4 — ABNORMAL LOW (ref 7–16)
BUN: 12 mg/dL (ref 7–18)
Bilirubin,Total: 0.5 mg/dL (ref 0.2–1.0)
Calcium, Total: 8.9 mg/dL (ref 8.5–10.1)
Chloride: 109 mmol/L — ABNORMAL HIGH (ref 98–107)
Co2: 25 mmol/L (ref 21–32)
Creatinine: 0.62 mg/dL (ref 0.60–1.30)
EGFR (African American): >60
EGFR (Non-African Amer.): >60
Glucose: 95 mg/dL (ref 65–99)
Osmolality: 275 (ref 275–301)
Potassium: 4.2 mmol/L (ref 3.5–5.1)
SGOT(AST): 24 U/L (ref 15–37)
SGPT (ALT): 20 U/L
Sodium: 138 mmol/L (ref 136–145)
Total Protein: 7.4 g/dL (ref 6.4–8.2)

## 2014-07-21 LAB — URINALYSIS, COMPLETE
Bacteria: NONE SEEN
Bilirubin,UR: NEGATIVE
Blood: NEGATIVE
Glucose,UR: NEGATIVE mg/dL (ref 0–75)
Ketone: NEGATIVE
Leukocyte Esterase: NEGATIVE
Nitrite: NEGATIVE
Ph: 5 (ref 4.5–8.0)
Protein: NEGATIVE
RBC,UR: 1 /HPF (ref 0–5)
Specific Gravity: 1.023 (ref 1.003–1.030)
Squamous Epithelial: 1
WBC UR: 1 /HPF (ref 0–5)

## 2014-07-21 LAB — LIPASE, BLOOD: Lipase: 137 U/L (ref 73–393)

## 2014-10-24 ENCOUNTER — Emergency Department
Admit: 2014-10-24 | Disposition: A | Discharge status: Home / Self Care | Payer: Self-pay | Admitting: Emergency Medicine

## 2014-10-24 LAB — URINALYSIS, COMPLETE
Bilirubin,UR: NEGATIVE
Glucose,UR: NEGATIVE mg/dL (ref 0–75)
Ketone: NEGATIVE
Nitrite: NEGATIVE
Ph: 5 (ref 4.5–8.0)
Protein: NEGATIVE
RBC,UR: 1 /HPF (ref 0–5)
Specific Gravity: 1.02 (ref 1.003–1.030)
Squamous Epithelial: 2
WBC UR: 1 /HPF (ref 0–5)

## 2014-10-24 LAB — CBC WITH DIFFERENTIAL/PLATELET
Basophil #: 0.1 10*3/uL (ref 0.0–0.1)
Basophil %: 0.6 %
Eosinophil #: 0.1 10*3/uL (ref 0.0–0.7)
Eosinophil %: 1.3 %
HCT: 44.2 % (ref 35.0–47.0)
HGB: 14.7 g/dL (ref 12.0–16.0)
Lymphocyte #: 2.1 10*3/uL (ref 1.0–3.6)
Lymphocyte %: 23.1 %
MCH: 29.5 pg (ref 26.0–34.0)
MCHC: 33.2 g/dL (ref 32.0–36.0)
MCV: 89 fL (ref 80–100)
Monocyte #: 0.8 x10 3/mm (ref 0.2–0.9)
Monocyte %: 9.1 %
Neutrophil #: 6 10*3/uL (ref 1.4–6.5)
Neutrophil %: 65.9 %
Platelet: 308 10*3/uL (ref 150–440)
RBC: 4.98 10*6/uL (ref 3.80–5.20)
RDW: 13.2 % (ref 11.5–14.5)
WBC: 9.1 10*3/uL (ref 3.6–11.0)

## 2014-10-24 LAB — COMPREHENSIVE METABOLIC PANEL
Albumin: 4.1 g/dL
Alkaline Phosphatase: 51 U/L
Anion Gap: 6 — ABNORMAL LOW (ref 7–16)
BUN: 11 mg/dL
Bilirubin,Total: 0.6 mg/dL
Calcium, Total: 8.8 mg/dL — ABNORMAL LOW
Chloride: 103 mmol/L
Co2: 27 mmol/L
Creatinine: 0.55 mg/dL
EGFR (African American): >60
EGFR (Non-African Amer.): >60
Glucose: 98 mg/dL
Potassium: 3.9 mmol/L
SGOT(AST): 18 U/L
SGPT (ALT): 18 U/L
Sodium: 136 mmol/L
Total Protein: 7.4 g/dL

## 2014-10-24 LAB — TROPONIN I: Troponin-I: <0.03 ng/mL

## 2014-11-11 NOTE — Op Note (Signed)
PATIENT NAME:  Mariah Richardson, Mariah Richardson MR#:  [id number] DATE OF BIRTH:  [birth year]  DATE OF PROCEDURE:  08/11/2012  PREOPERATIVE DIAGNOSIS:  Acute cholecystitis.  POSTOPERATIVE DIAGNOSIS:  Acute cholecystitis.   PROCEDURE: Laparoscopic cholecystectomy.   SURGEON:  Gudrun Axe S. Jarvis Sawa, MD  DESCRIPTION OF PROCEDURE:  The patient was brought to surgery and under general anesthesia the abdomen was then prepped and draped. The patient had made a small incision above the umbilicus. After cutting skin and subcutaneous tissue, the fascia was cut. The abdomen was entered. 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 All the adhesions were released and after that another trocar in the epigastric region, two 5 mm were then put in the right upper quadrant of the abdomen. The gallbladder was then lifted up. A lot of adhesions were noticed around the cystic duct-gallbladder junction. The cystic duct was small. Cystic artery was then clipped and cut. Cystic duct was then clipped and cut and the gallbladder was removed from the liver bed without any incidents after that the gallbladder was removed from the epigastric port. Irrigation of the undersurface was done, made sure there was no biliary leak or bleeding. It was found to be none. After that, all the trocars were removed under direct vision. The epigastric port was then closed with interrupted 0 Vicryl sutures and staples were applied to the rest of the skin and Marcaine was injected. The patient tolerated the procedure well and was sent to the recovery room in satisfactory condition.    ____________________________ Alton RevereMasud S. Cecelia ByarsHashmi, MD msh:cs D: 08/11/2012 15:04:04 ET T: 08/11/2012 15:43:05 ET JOB#: [id number]  cc: Lacy Sofia S. Cecelia ByarsHashmi, MD, <Dictator> Meryle ReadyMASUD S Burma Ketcher MD ELECTRONICALLY SIGNED 08/16/2012 13:40

## 2014-11-29 NOTE — H&P (Signed)
L&D Evaluation:  History:   HPI 29 yo [id number] EDD of 02/16/12 per LMP presents with contractions.  +FM, no LOF, no VB.  [facility], [facility], + GBS in urine, hyperemesis and multiple visits to L&D for pain/discomfort.    Presents with contractions    Patient's Medical History No Chronic Illness    Patient's Surgical History Appendectomy  D&E    Medications Pre Natal Vitamins    Allergies NKDA    Social History none    Family History Non-Contributory   ROS:   ROS 10 point review of systems negative unless otherwise noted in HPI   Exam:   Vital Signs stable    General no apparent distress    Abdomen gravid, non-tender    Estimated Fetal Weight Average for gestational age    Fetal Position vtx    Back no CVAT    Edema no edema    Pelvic no external lesions, 2-3/long/-1    Mebranes Intact    FHT normal rate with no decels    Ucx 2-3 minutes have since slowed to 1 every 10-15 minutes   Impression:   Impression Braxton hicks contractions   Plan:   Comments - no cervical changed over prolonged rule out (4-hrs) - routine labor precautions discharge home    Follow Up Appointment already scheduled. 01/29/12   Electronic Signatures: Lorrene ReidStaebler, Paradise Vensel M (MD)  (Signed 06-Jul-13 07:50)  Authored: L&D Evaluation   Last Updated: 06-Jul-13 07:50 by Lorrene ReidStaebler, Hilmar Moldovan M (MD)

## 2014-11-29 NOTE — H&P (Signed)
L&D Evaluation:  History:   HPI 29 year old 774P1021 at 7867w3d gestational age by LMP 02/16/2012 presented to L&D early yesterday morning with complaints of nausea/vomiting/abdominal and back pain/and loose stools x 3 days. Seen here 3 weeks ago for similar complaints.  [facility] to care and 3 admissions for hyperemesis, then [facility], and [facility] She has [facility] multiple times this pregnancy. Yesterday, she c/o nausea/emesis that ha\d been ongoing during her pregnancy.  She tried Zofran and phenergan for her nausea and stated Zofran does not work although she took a Zofran tablet prior to leaving her house yesterday morning. On her way to the hospital yesterdayh, she had to stop several times to vomit. She last kept down fluids 4/28. Had one loose stool yesterday AM and 3-4 two days ago.  She was treated with Terb which stopped the abd pain, K supplementation, IV Diflucan for yeast and Phenergan for vomiting.  Has had antibiotics (amoxicillin) in the past 6 months. On return to L&D, she c/o nausea, vomiting and diffuse abdominal pain same as before.  Pain is constant.  She has had vomiting x 5 since discharge yesterday.  U/S in ER on 3/6 was WNL.  EDD per LMP and 1st trimester ultrasound is 02/16/12. Prenatal care also remarkable for +GBS bacteruria. Prior SVD at 37 weeks delivering a 7#10 baby. B POS, RI, VI    Presents with abdominal pain, back pain, nausea/vomiting, and loose stools    Patient's Medical History No Chronic Illness    Patient's Surgical History Appendectomy  Elective AB    Medications Pre Natal Vitamins  phenergan, zofran, vitamin B6    Allergies NKDA    Social History none    Family History Non-Contributory   ROS:   ROS see HPI, all other reviewed and neg   Exam:   Vital Signs stable    Urine Protein negative dipstick, UA:  3+ LEs, 5 WBCs, 4 RBCs, 4 epith    General  no apparent distress    Mental Status clear    Chest clear    Heart normal sinus rhythm, no murmur/gallop/rubs    Abdomen gravid, non-tender    Edema no edema    Pelvic 1cm, soft per RN    Mebranes Intact    FHT normal rate with no decels, reactive    FHT Description moderate variability    Fetal Heart Rate 150    Ucx irregular, mild by toco    Skin dry   Impression:   Impression IUP at 27 3/7 weeks with preterm contractions   Plan:   Comments 1)  Terbutaline for contractions. 2)  BMZ course given change of cervix from closed to 1cm. 3)  Antiemetics prn.  IV hydration. 4)  Will start Ancef 2g IV q 24hrs for presumed UTI that was not treated during last visit.  Culture pending. 5)  Monitor for contractions and cervical change.   Electronic Signatures: Senaida LangeWeaver-Lee, Gabrelle Roca (MD)  (Signed 01-May-13 01:05)  Authored: L&D Evaluation   Last Updated: 01-May-13 01:05 by Senaida LangeWeaver-Lee, Eulala Newcombe (MD)

## 2014-11-29 NOTE — H&P (Signed)
L&D Evaluation:  History Expanded:   HPI 29 yo G4P1021 whse EDC = July 28 (29+). [facility].  Pt presents with some 3 weeks of BLAP, initially intermittent not constant, radiating to the back, denies any GI or Gu Sx.  Pt rates her pain as 8 on scale of 10.    Patient's Medical History No Chronic Illness    Patient's Surgical History Appendectomy    Medications Pre Natal Vitamins    Allergies NKDA    Social History none   Exam:   Vital Signs stable    General no apparent distress    Mental Status clear    Chest clear    Heart normal sinus rhythm    Abdomen gravid, non-tender, soft, NBS, no contractions    FHT normal rate with no decels    Ucx absent   Impression:   Impression BLAP   Plan:   Plan Cath urien for UA, C+S, CBC, possibly get an US, If these are normal and monitoring is normal will probably allow to go home   Electronic Signatures: Towana Badgerosenow, Hisae Decoursey J (MD)  (Signed 16-May-13 16:10)  Authored: L&D Evaluation   Last Updated: 16-May-13 16:10 by Towana Badgerosenow, Jissel Slavens J (MD)

## 2014-11-29 NOTE — H&P (Signed)
L&D Evaluation:  History Expanded:   HPI 29 yo [id number] EDD of 02/16/12 per LMP presents with c/o diffuse abdominal pain.  [facility], [facility], + GBS in urine, hyperemesis and multiple visits to L&D for pain/discomfort.    Blood Type B positive    Group B Strep Results (Result >5wks must be treated as unknown) positive    Maternal HIV Negative    Maternal Syphilis Ab Nonreactive    Maternal Varicella Immune    Rubella Results immune    Patient's Medical History No Chronic Illness    Patient's Surgical History Appendectomy  D&E    Medications Pre Natal Vitamins    Allergies NKDA    Social History none   ROS:   ROS see HPI   Exam:   Vital Signs stable    General no apparent distress    Mental Status clear    Chest clear    Heart no murmur/gallop/rubs    Abdomen gravid, tender with contractions    Edema no edema    Pelvic no external lesions, 2/long/high    Mebranes Intact    FHT normal rate with no decels, BL 140, mod variability, + Accels    Ucx irregular    Skin dry    Other UA: 3+ leuk, trace bacteria, otherwise neg   Impression:   Impression reactive NST, diffuse abdominal pain   Plan:   Plan discharge    Comments Discussed discomforts of the end of pregnancy Encouraged rest/relaxation    Follow Up Appointment already scheduled. 01/21/12 with Dr Jean RosenthalJackson   Electronic Signatures: Vella KohlerBrothers, Veverly Larimer K (CNM)  (Signed 27-Jun-13 16:50)  Authored: L&D Evaluation   Last Updated: 27-Jun-13 16:50 by Vella KohlerBrothers, Jamyson Jirak K (CNM)

## 2014-11-29 NOTE — H&P (Signed)
L&D Evaluation:  History:   HPI 29 year old G4P1021 at 20 weeks 1 day presents to L&D with multiple complaints. C/O decreased fetal movement, cramping/abd pain, back pain, nausea and vomiting, and nasal congestion. Pt also states some chest pain that started 2 days ago when she developed a cough. She also staets her daughter was recently diagnosed with pneumonia.  [facility] and 3 admissions for hyperemesis, then [facility]. [facility] for similar complaints as listed above. At that time it was noted she had UTI per UA and urine was cultured and was +GBS. Pt staets she was recently taking PCN for infected tooth and is now on Amoxicillin for GBS+ urine U/S in ER on 3/6 was WNL. EDD per LMP is 02/16/12    Presents with abdominal pain, decreased fetal movement, nausea/vomiting    Patient's Medical History No Chronic Illness    Patient's Surgical History Appendectomy    Medications Pre Natal Vitamins  phenergan, PCN    Allergies NKDA    Social History none    Family History Non-Contributory   ROS:   ROS see above   Exam:   Vital Signs stable  02 sat 99-100%    Urine Protein not completed    General no apparent distress    Mental Status clear    Chest clear  decreased in the bases    Heart normal sinus rhythm, no murmur/gallop/rubs    Abdomen gravid, non-tender    Estimated Fetal Weight Average for gestational age    Back no CVAT    Edema no edema    Pelvic no external lesions, cervix closed and thick    Mebranes Intact    FHT +FHR    Ucx absent    Skin dry   Impression:   Impression 20 week IUP, nausea/vomiting, possible URI   Plan:   Plan EFM/NST, transfer back to ER for evaluation of chest pain and URI    Comments Obstetrically pt is cleared, has has +FHT's, having no VB, LOF   Electronic Signatures: Shella Maximutnam, Swan Zayed (CNM)  (Signed 10-Mar-13 17:47)  Authored: L&D  Evaluation   Last Updated: 10-Mar-13 17:47 by Shella MaximPutnam, Khiry Pasquariello (CNM)

## 2014-11-29 NOTE — H&P (Signed)
L&D Evaluation:  History:   HPI 29 yo G4P1021 at 6076w5d by D=12w US EDC of 02/16/2012 presenting with hyperemesis unresponsive to her home po antiemetics.  The patient has had prior admisions this pregancy for hypremesis.  She also reports some abdominal/pelvic pain/soreness.  No LOF, no VB, no FM.    Presents with nausea/vomiting    Patient's Medical History No Chronic Illness    Patient's Surgical History Appendectomy    Medications Pre Natal Vitamins  promethazine    Allergies NKDA, other    Social History none    Family History Non-Contributory   ROS:   ROS All systems were reviewed.  HEENT, CNS, GI, GU, Respiratory, CV, Renal and Musculoskeletal systems were found to be normal.   Exam:   Vital Signs stable    General no apparent distress    Mental Status clear    Abdomen non-tender   Impression:   Impression dehydration, Hyperemesis gravidarum   Plan:   Comments - CMP without abnormalities - UA pending - D5 LR at 15050ml/hr - Zofran & compazine IV will re-evaluate in AM   Electronic Signatures: Lorrene ReidStaebler, Jariah Jarmon M (MD)  (Signed 18-Jan-13 18:10)  Authored: L&D Evaluation   Last Updated: 18-Jan-13 18:10 by Lorrene ReidStaebler, Geary Rufo M (MD)

## 2014-11-29 NOTE — H&P (Signed)
L&D Evaluation:  History:   HPI 29 year old 324P1021 at 8878w2d gestational age presents to L&D with multiple complaints. Cramping and lower abd pain, as well as nausea and vomiting.[facility] and 3 admissions for hyperemesis, then [facility]. She has been [facility] multiple times this pregnancy.  This is her second visit to labor and delivery since 20 weeks.   She states that the nausea/emesis has been ongoing during her pregnancy.  She has tried zofran and phenergan for her nausea and states zofran does not work.  Her nausea remitted for about 1 week in early March, but has been back since early March.  She states that she vomited 5 times today. She has tried nothing for this.  She also states that she has lower abdominal pain and this has been going on for about one month.  She notes + fetal movement.  Denies vaginal bleeding and leakage of fluid. Denies urinary and vaginal symptoms.  U/S in ER on 3/6 was WNL. EDD per LMP and 1st trimester ultrasound is 02/16/12    Presents with abdominal pain, nausea/vomiting    Patient's Medical History No Chronic Illness    Patient's Surgical History Appendectomy    Medications Pre Natal Vitamins  phenergan    Allergies NKDA    Social History none    Family History Non-Contributory   ROS:   ROS see above   Exam:   Vital Signs stable  afebrile    Urine Protein negative dipstick    General no apparent distress    Mental Status clear    Chest clear  decreased in the bases    Heart normal sinus rhythm, no murmur/gallop/rubs    Abdomen gravid, non-tender    Estimated Fetal Weight Average for gestational age    Back no CVAT    Edema no edema    Pelvic no external lesions, cervix closed and thick, thick, white discharge    Mebranes Intact    FHT +FHR about 150 bpm    Ucx absent    Skin dry    Other [place]: neg KOH: + fungal elements UA: negative ketones, LE 3+,  bacteria trace, epithelial cells = 11/hpf   Impression:   Impression Ongoing nausea of pregnancy-no evidence of dehydration, Vulvovaginal candidiasis   Plan:   Plan UA, EFM/NST    Comments - Recommend zofran ODT 4mg  Q 8 hours scheduled - use phenergan as needed - Diflucan 150mg  x 1, given here at hospital - also for nausea, recommend Vitamin B6 25mg  tid and unasom 25mg  at bedtime - will send urine for culture. - she has [facility] app[facility] coming [facility], which she should keep.  We did discuss continuity of care and plan.  It is difficult for me to treat her here and make adjustments when she doesn't follow up with our practice.  I [facility] so that she optimizes her care.  She is also welcome to see us at our clinic if she continues to see us here at the hospital.  But, she should pick one or the other.    Follow Up Appointment already scheduled   Electronic Signatures: Conard NovakJackson, Renea Schoonmaker D (MD)  (Signed 09-Apr-13 16:10)  Authored: L&D Evaluation   Last Updated: 09-Apr-13 16:10 by Conard NovakJackson, Nadeem Romanoski D (MD)

## 2014-11-29 NOTE — H&P (Signed)
L&D Evaluation:  History:   HPI 29 year old 204P1021 at 6317w2d gestational age by LMP 02/16/2012 presents to L&D with  complaints of nausea/vomiting/abdominal and back pain/and loose stools x 3 days. Seen here 3 weeks ago for similar complaints.[facility] and 3 admissions for hyperemesis, then [facility], and recently [facility] has [facility]. She has [facility] multiple times this pregnancy.  This is her third visit to labor and delivery since 20 weeks.   She states that the nausea/emesis has been ongoing during her pregnancy.  She has tried zofran and phenergan for her nausea and states zofran does not work although she took a Zofran tablet prior to leaving her house this AM at 0400. On her way to the hospital she had to stop several times to vomit. She last kept down fluids 4/28. Had one loose stool this AM and 3-4 yesterday. Has had antibiotics (amoxicillin) in the past 6 months. She notes + fetal movement.  Denies vaginal bleeding and leakage of fluid. Denies urinary and vaginal symptoms except some urinary frequency recently.  U/S in ER on 3/6 was WNL. EDD per LMP and 1st trimester ultrasound is 02/16/12. Prenatal care also remarkable for +GBS bacteruria. Prior SVD at 37 weeks delivering a 7#10 baby. B POS, RI, VI    Presents with abdominal pain, back pain, nausea/vomiting, and loose stools    Patient's Medical History No Chronic Illness    Patient's Surgical History Appendectomy  Elective AB    Medications Pre Natal Vitamins  phenergan, zofran, vitamin B6    Allergies NKDA    Social History none    Family History Non-Contributory   ROS:   ROS see HPI   Exam:   Vital Signs stable    Urine Protein negative dipstick, negative ketones, sp gravity1.013, +3 leuks, 4 RBC, 5WBC, 4 epi cells, trace bacteria    General appears uncomfortable    Mental Status clear    Chest clear    Heart normal sinus rhythm,  no murmur/gallop/rubs    Abdomen gravid, tender with contractions, upper abdomen soft and NT,    Edema no edema    Reflexes 2+    Pelvic no external lesions, cervix closed and thick, white cottage cheese discharge. wet prep is positive for hyphae, neg for Trich or clue cells    Mebranes Intact    FHT normal rate with no decels, 140 with accels    FHT Description moderate variability    Ucx q2-3 min apart, many 20 -50 sec    Skin dry   Impression:   Impression IUP at 27 2/7 weeks with threatened preterm labor. Contractions probably due to N/V/loose stools. Monilia. R/O UTI   Plan:   Plan urine cul;ture. IV fluids. IV Phenergan. terbutaline subcut. FFN. Diflucan when nausea improved   Electronic Signatures: Farrel ConnersGutierrez, Mariangela Heldt L (CNM)  (Signed 30-Apr-13 09:01)  Authored: L&D Evaluation   Last Updated: 30-Apr-13 09:01 by Trinna BalloonGutierrez, Kattleya Kuhnert L (CNM)

## 2014-11-29 NOTE — H&P (Signed)
L&D Evaluation:  History:   HPI 29 year old 774P1021 at 528w4d gestational age by LMP 02/16/2012 presents to L&D with  complaints of decreased fetal movement.  Was recently seen for viral gastroenterities and premature cervical dilation to 1cm and received BMZx2.  States that today she has not felt the baby move at all.  [facility] to care and 3 admissions for hyperemesis, then [facility], and recently [facility]. She [facility] multiple times this pregnancy.  This is her third visit to labor and delivery since 20 weeks.   She states that the nausea/emesis has been ongoing during her pregnancy.  She has tried zofran and phenergan for her nausea and states zofran does not work although she took a Zofran tablet prior to leaving her house this AM at 0400. On her way to the hospital she had to stop several times to vomit. She last kept down fluids 4/28. Had one loose stool this AM and 3-4 yesterday. Has had antibiotics (amoxicillin) in the past 6 months. She notes + fetal movement.  Denies vaginal bleeding and leakage of fluid. Denies urinary and vaginal symptoms except some urinary frequency recently.  U/S in ER on 3/6 was WNL. EDD per LMP and 1st trimester ultrasound is 02/16/12. Prenatal care also remarkable for +GBS bacteruria. Prior SVD at 37 weeks delivering a 7#10 baby. B POS, RI, VI    Presents with abdominal pain, back pain, nausea/vomiting, and loose stools    Patient's Medical History No Chronic Illness    Patient's Surgical History Appendectomy  Elective AB    Medications Pre Natal Vitamins  phenergan, zofran, vitamin B6    Allergies NKDA    Social History none    Family History Non-Contributory   ROS:   ROS see HPI   Exam:   Vital Signs stable    Urine Protein not completed, negative ketones, sp gravity1.013, +3 leuks, 4 RBC, 5WBC, 4 epi cells, trace bacteria    General appears comfortable     Mental Status clear    Chest clear    Heart normal sinus rhythm, no murmur/gallop/rubs    Abdomen gravid, tender with contractions, upper abdomen soft and NT,    Edema no edema    Reflexes 2+    Pelvic no external lesions, long/1/hi    Mebranes Intact    FHT other, 145, moderate variability, no accels, no decels, non-reactive    FHT Description moderate variability    Ucx absent    Skin dry   Impression:   Impression IUP at 27 42/7 weeks with decreased fetal movement   Plan:   Comments 1) Decreased fetal movement: NST non-reactive, fetal movement is felt and able to be ellicited with vibroacoustic stimulation but the patient is unable to feel movement.  BPP performed 8/8 with good fetal movement.  Patient was abel to feel approximately 50% of observed onscreen movement.  AFI 16.22cm.    2) Viral gastroenteritis - continue regimen of phenergan/zofran  3) Premature cervical dilation - stable on todays check, pelvic rest, modified bedrest at home  4) Disposition - discharge home [facility]   Electronic Signatures: Lorrene ReidStaebler, Yazaira Speas M (MD)  (Signed [phone]-May-13 20:40)  Authored: L&D Evaluation   Last Updated: 02-May-13 20:40 by Lorrene ReidStaebler, Macen Joslin M (MD)

## 2015-03-08 LAB — HM PAP SMEAR: HM Pap smear: NEGATIVE

## 2015-07-20 ENCOUNTER — Emergency Department
Admission: EM | Admit: 2015-07-20 | Discharge: 2015-07-20 | Disposition: A | Discharge status: Home / Self Care | Payer: Medicaid Other | Attending: Emergency Medicine | Admitting: Emergency Medicine

## 2015-07-20 DIAGNOSIS — R197 Diarrhea, unspecified: Secondary | ICD-10-CM | POA: Diagnosis present

## 2015-07-20 DIAGNOSIS — A084 Viral intestinal infection, unspecified: Secondary | ICD-10-CM | POA: Diagnosis not present

## 2015-07-20 DIAGNOSIS — Z3202 Encounter for pregnancy test, result negative: Secondary | ICD-10-CM | POA: Insufficient documentation

## 2015-07-20 DIAGNOSIS — R000 Tachycardia, unspecified: Secondary | ICD-10-CM | POA: Insufficient documentation

## 2015-07-20 LAB — COMPREHENSIVE METABOLIC PANEL
ALT: 14 U/L (ref 14–54)
AST: 14 U/L — ABNORMAL LOW (ref 15–41)
Albumin: 4.3 g/dL (ref 3.5–5.0)
Alkaline Phosphatase: 61 U/L (ref 38–126)
Anion gap: 6 (ref 5–15)
BUN: 12 mg/dL (ref 6–20)
CO2: 27 mmol/L (ref 22–32)
Calcium: 8.8 mg/dL — ABNORMAL LOW (ref 8.9–10.3)
Chloride: 106 mmol/L (ref 101–111)
Creatinine, Ser: 0.58 mg/dL (ref 0.44–1.00)
GFR calc Af Amer: >60 mL/min (ref >60)
GFR calc non Af Amer: >60 mL/min (ref >60)
Glucose, Bld: 108 mg/dL — ABNORMAL HIGH (ref 65–99)
Potassium: 3.7 mmol/L (ref 3.5–5.1)
Sodium: 139 mmol/L (ref 135–145)
Total Bilirubin: 0.3 mg/dL (ref 0.3–1.2)
Total Protein: 8 g/dL (ref 6.5–8.1)

## 2015-07-20 LAB — URINALYSIS COMPLETE WITH MICROSCOPIC (ARMC ONLY)
Bacteria, UA: NONE SEEN
Bilirubin Urine: NEGATIVE
Glucose, UA: NEGATIVE mg/dL
Ketones, ur: NEGATIVE mg/dL
Leukocytes, UA: NEGATIVE
Nitrite: NEGATIVE
Protein, ur: NEGATIVE mg/dL
Specific Gravity, Urine: 1.023 (ref 1.005–1.030)
pH: 6 (ref 5.0–8.0)

## 2015-07-20 LAB — CBC WITH DIFFERENTIAL/PLATELET
Basophils Absolute: 0 10*3/uL (ref 0–0.1)
Basophils Relative: 0 %
Eosinophils Absolute: 0.2 10*3/uL (ref 0–0.7)
Eosinophils Relative: 1 %
HCT: 44.8 % (ref 35.0–47.0)
Hemoglobin: 15.1 g/dL (ref 12.0–16.0)
Lymphocytes Relative: 6 %
Lymphs Abs: 0.9 10*3/uL — ABNORMAL LOW (ref 1.0–3.6)
MCH: 29.2 pg (ref 26.0–34.0)
MCHC: 33.7 g/dL (ref 32.0–36.0)
MCV: 86.9 fL (ref 80.0–100.0)
Monocytes Absolute: 0.9 10*3/uL (ref 0.2–0.9)
Monocytes Relative: 6 %
Neutro Abs: 12.4 10*3/uL — ABNORMAL HIGH (ref 1.4–6.5)
Neutrophils Relative %: 87 %
Platelets: 297 10*3/uL (ref 150–440)
RBC: 5.16 MIL/uL (ref 3.80–5.20)
RDW: 13.1 % (ref 11.5–14.5)
WBC: 14.4 10*3/uL — ABNORMAL HIGH (ref 3.6–11.0)

## 2015-07-20 LAB — PREGNANCY, URINE: Preg Test, Ur: NEGATIVE

## 2015-07-20 LAB — LIPASE, BLOOD: Lipase: 27 U/L (ref 11–51)

## 2015-07-20 LAB — TROPONIN I: Troponin I: <0.03 ng/mL (ref <0.031)

## 2015-07-20 MED ORDER — LOPERAMIDE HCL 2 MG PO TABS: 4.0000 mg | ORAL_TABLET | Freq: Four times a day (QID) | ORAL | Status: DC | PRN

## 2015-07-20 MED ORDER — SODIUM CHLORIDE 0.9 % IV BOLUS (SEPSIS)
1000.0000 mL | Freq: Once | INTRAVENOUS | Status: AC
  Administered 2015-07-20: 1000 mL via INTRAVENOUS

## 2015-07-20 MED ORDER — FAMOTIDINE 20 MG PO TABS
40.0000 mg | ORAL_TABLET | Freq: Once | ORAL | Status: AC
  Administered 2015-07-20: 40 mg via ORAL
  Filled 2015-07-20: qty 2

## 2015-07-20 MED ORDER — METOCLOPRAMIDE HCL 5 MG/ML IJ SOLN
INTRAMUSCULAR | Status: AC
  Administered 2015-07-20: 10 mg via INTRAVENOUS
  Filled 2015-07-20: qty 2

## 2015-07-20 MED ORDER — METOCLOPRAMIDE HCL 5 MG/ML IJ SOLN
10.0000 mg | Freq: Once | INTRAMUSCULAR | Status: AC
  Administered 2015-07-20: 10 mg via INTRAVENOUS

## 2015-07-20 MED ORDER — GI COCKTAIL ~~LOC~~
30.0000 mL | ORAL | Status: AC
  Administered 2015-07-20: 30 mL via ORAL
  Filled 2015-07-20: qty 30

## 2015-07-20 MED ORDER — RANITIDINE HCL 150 MG PO CAPS: 150.0000 mg | ORAL_CAPSULE | Freq: Two times a day (BID) | ORAL | Status: DC

## 2015-07-20 MED ORDER — ONDANSETRON HCL 4 MG/2ML IJ SOLN
4.0000 mg | Freq: Once | INTRAMUSCULAR | Status: AC
  Administered 2015-07-20: 4 mg via INTRAVENOUS
  Filled 2015-07-20: qty 2

## 2015-07-20 MED ORDER — ONDANSETRON 8 MG PO TBDP: 8.0000 mg | ORAL_TABLET | Freq: Three times a day (TID) | ORAL | Status: DC | PRN

## 2015-07-20 MED ORDER — LOPERAMIDE HCL 2 MG PO CAPS
2.0000 mg | ORAL_CAPSULE | ORAL | Status: DC | PRN
  Administered 2015-07-20: 2 mg via ORAL
  Filled 2015-07-20: qty 1

## 2015-07-20 NOTE — ED Notes (Signed)
Patient given Ginger Ale for PO fluid challenge.

## 2015-07-20 NOTE — ED Notes (Signed)
Pt c/o N/V/D since 230am

## 2015-07-20 NOTE — Discharge Instructions (Signed)

## 2015-07-20 NOTE — ED Provider Notes (Signed)
[facility] Emergency Department Provider Note  ____________________________________________  Time seen: 8:25 AM  I have reviewed the triage vital signs and the nursing notes.   HISTORY  Chief Complaint Emesis and Diarrhea    HPI Mariah Richardson is a 29 y.o. female who complains of nausea vomiting and diarrhea that started early this morning at about 2:30 AM. No chest pain shortness of breath headache fevers or chills.She does feel dizzy when she stands upright. No syncope. Works in [employer] but no recent illness that she can pinpoint.     History reviewed. No pertinent past medical history.   There are no active problems to display for this patient.    Past Surgical History  Procedure Laterality Date  . Appendectomy    . Cholecystectomy       Current Outpatient Rx  Name  Route  Sig  Dispense  Refill  . loperamide (IMODIUM A-D) 2 MG tablet   Oral   Take 2 tablets (4 mg total) by mouth 4 (four) times daily as needed for diarrhea or loose stools.   30 tablet   0   . ondansetron (ZOFRAN ODT) 8 MG disintegrating tablet   Oral   Take 1 tablet (8 mg total) by mouth every 8 (eight) hours as needed for nausea or vomiting.   20 tablet   0   . ranitidine (ZANTAC) 150 MG capsule   Oral   Take 1 capsule (150 mg total) by mouth 2 (two) times daily.   28 capsule   0      Allergies Review of patient's allergies indicates no known allergies.   No family history on file.  Social History Social History  Substance Use Topics  . Smoking status: Never Smoker   . Smokeless tobacco: None  . Alcohol Use: Yes    Review of Systems  Constitutional:   No fever or chills. No weight changes Eyes:   No blurry vision or double vision.  ENT:   No sore throat. Cardiovascular:   No chest pain. Respiratory:   No dyspnea or cough. Gastrointestinal:   Negative for abdominal pain, vomiting and diarrhea.  No BRBPR or melena. Genitourinary:   Negative  for dysuria, urinary retention, bloody urine, or difficulty urinating. Musculoskeletal:   Negative for back pain. No joint swelling or pain. Skin:   Negative for rash. Neurological:   Negative for headaches, focal weakness or numbness. Psychiatric:  No anxiety or depression.   Endocrine:  No hot/cold intolerance, changes in energy, or sleep difficulty.  10-point ROS otherwise negative.  ____________________________________________   PHYSICAL EXAM:  VITAL SIGNS: ED Triage Vitals  Enc Vitals Group     BP 07/20/15 0813 114/86 mmHg     Pulse Rate 07/20/15 0813 112     Resp 07/20/15 0813 18     Temp 07/20/15 0813 98.3 F (36.8 C)     Temp src --      SpO2 07/20/15 0813 98 %     Weight 07/20/15 0813 150 lb (68.04 kg)     Height 07/20/15 0813 5\' 3"  (1.6 m)     Head Cir --      Peak Flow --      Pain Score 07/20/15 0814 9     Pain Loc --      Pain Edu? --      Excl. in GC? --     Vital signs reviewed, nursing assessments reviewed.   Constitutional:   Alert and oriented. Well appearing  and in no distress. Eyes:   No scleral icterus. No conjunctival pallor. PERRL. EOMI ENT   Head:   Normocephalic and atraumatic.   Nose:   No congestion/rhinnorhea. No septal hematoma   Mouth/Throat:   MMM, no pharyngeal erythema. No peritonsillar mass. No uvula shift.   Neck:   No stridor. No SubQ emphysema. No meningismus. Hematological/Lymphatic/Immunilogical:   No cervical lymphadenopathy. Cardiovascular:   Tachycardia heart rate 105. Normal and symmetric distal pulses are present in all extremities. No murmurs, rubs, or gallops. Respiratory:   Normal respiratory effort without tachypnea nor retractions. Breath sounds are clear and equal bilaterally. No wheezes/rales/rhonchi. Gastrointestinal:   Soft with mild epigastric pain. No distention. There is no CVA tenderness.  No rebound, rigidity, or guarding. Genitourinary:   deferred Musculoskeletal:   Nontender with normal range of  motion in all extremities. No joint effusions.  No lower extremity tenderness.  No edema. Neurologic:   Normal speech and language.  CN 2-10 normal. Motor grossly intact. No pronator drift.  Normal gait. No gross focal neurologic deficits are appreciated.  Skin:    Skin is warm, dry and intact. No rash noted.  No petechiae, purpura, or bullae. Psychiatric:   Mood and affect are normal. Speech and behavior are normal. Patient exhibits appropriate insight and judgment.  ____________________________________________    LABS (pertinent positives/negatives) (all labs ordered are listed, but only abnormal results are displayed) Labs Reviewed  COMPREHENSIVE METABOLIC PANEL - Abnormal; Notable for the following:    Glucose, Bld 108 (*)    Calcium 8.8 (*)    AST 14 (*)    All other components within normal limits  CBC WITH DIFFERENTIAL/PLATELET - Abnormal; Notable for the following:    WBC 14.4 (*)    Neutro Abs 12.4 (*)    Lymphs Abs 0.9 (*)    All other components within normal limits  URINALYSIS COMPLETEWITH MICROSCOPIC ([facility] ONLY) - Abnormal; Notable for the following:    Color, Urine YELLOW (*)    APPearance CLEAR (*)    Hgb urine dipstick 3+ (*)    Squamous Epithelial / LPF 0-5 (*)    All other components within normal limits  LIPASE, BLOOD  TROPONIN I  PREGNANCY, URINE   ____________________________________________   EKG    ____________________________________________    RADIOLOGY    ____________________________________________   PROCEDURES   ____________________________________________   INITIAL IMPRESSION / ASSESSMENT AND PLAN / ED COURSE  Pertinent labs & imaging results that were available during my care of the patient were reviewed by me and considered in my medical decision making (see chart for details).  Patient presents with nausea vomiting diarrhea, overall very comfortable and well-appearing. Has mild tachycardia of 105 so we'll give her fluids  and check labs. Low suspicion for perforation obstruction biliary complication pancreatitis. Low suspicion for cardiopulmonary pathology.  ----------------------------------------- 9:51 AM on 07/20/2015 -----------------------------------------  Heart rate 90, feels much better after fluids and medications including antacids and anti-medics. We'll discharge on the same for supportive treatment while this likely viral gastroenteritis runs its course.     ____________________________________________   FINAL CLINICAL IMPRESSION(S) / ED DIAGNOSES  Final diagnoses:  Viral gastroenteritis      Sharman Cheek, MD 07/20/15 [phone]

## 2015-10-03 DIAGNOSIS — F419 Anxiety disorder, unspecified: Secondary | ICD-10-CM | POA: Insufficient documentation

## 2015-11-11 ENCOUNTER — Encounter: Payer: Self-pay | Admitting: Emergency Medicine

## 2015-11-11 ENCOUNTER — Emergency Department
Admission: EM | Admit: 2015-11-11 | Discharge: 2015-11-11 | Disposition: A | Discharge status: Home / Self Care | Payer: Medicaid Other | Attending: Emergency Medicine | Admitting: Emergency Medicine

## 2015-11-11 ENCOUNTER — Emergency Department: Payer: Medicaid Other

## 2015-11-11 DIAGNOSIS — G8929 Other chronic pain: Secondary | ICD-10-CM | POA: Insufficient documentation

## 2015-11-11 DIAGNOSIS — Z79899 Other long term (current) drug therapy: Secondary | ICD-10-CM | POA: Diagnosis not present

## 2015-11-11 DIAGNOSIS — R102 Pelvic and perineal pain: Secondary | ICD-10-CM | POA: Insufficient documentation

## 2015-11-11 HISTORY — DX: Gastritis, unspecified, without bleeding: K29.70

## 2015-11-11 LAB — COMPREHENSIVE METABOLIC PANEL
ALT: 15 U/L (ref 14–54)
AST: 18 U/L (ref 15–41)
Albumin: 4.1 g/dL (ref 3.5–5.0)
Alkaline Phosphatase: 47 U/L (ref 38–126)
Anion gap: 6 (ref 5–15)
BUN: 13 mg/dL (ref 6–20)
CO2: 28 mmol/L (ref 22–32)
Calcium: 9 mg/dL (ref 8.9–10.3)
Chloride: 104 mmol/L (ref 101–111)
Creatinine, Ser: 0.49 mg/dL (ref 0.44–1.00)
GFR calc Af Amer: >60 mL/min (ref >60)
GFR calc non Af Amer: >60 mL/min (ref >60)
Glucose, Bld: 104 mg/dL — ABNORMAL HIGH (ref 65–99)
Potassium: 4.1 mmol/L (ref 3.5–5.1)
Sodium: 138 mmol/L (ref 135–145)
Total Bilirubin: 0.7 mg/dL (ref 0.3–1.2)
Total Protein: 7.5 g/dL (ref 6.5–8.1)

## 2015-11-11 LAB — URINALYSIS COMPLETE WITH MICROSCOPIC (ARMC ONLY)
Bilirubin Urine: NEGATIVE
Glucose, UA: NEGATIVE mg/dL
Ketones, ur: NEGATIVE mg/dL
Leukocytes, UA: NEGATIVE
Nitrite: NEGATIVE
Protein, ur: NEGATIVE mg/dL
Specific Gravity, Urine: 1.017 (ref 1.005–1.030)
pH: 5 (ref 5.0–8.0)

## 2015-11-11 LAB — CBC
HCT: 41.4 % (ref 35.0–47.0)
Hemoglobin: 14 g/dL (ref 12.0–16.0)
MCH: 29.2 pg (ref 26.0–34.0)
MCHC: 33.9 g/dL (ref 32.0–36.0)
MCV: 85.9 fL (ref 80.0–100.0)
Platelets: 350 10*3/uL (ref 150–440)
RBC: 4.81 MIL/uL (ref 3.80–5.20)
RDW: 13.4 % (ref 11.5–14.5)
WBC: 10 10*3/uL (ref 3.6–11.0)

## 2015-11-11 LAB — POCT PREGNANCY, URINE: Preg Test, Ur: NEGATIVE

## 2015-11-11 LAB — LIPASE, BLOOD: Lipase: 22 U/L (ref 11–51)

## 2015-11-11 MED ORDER — IBUPROFEN 600 MG PO TABS
600.0000 mg | ORAL_TABLET | ORAL | Status: AC
  Administered 2015-11-11: 600 mg via ORAL
  Filled 2015-11-11: qty 1

## 2015-11-11 NOTE — ED Notes (Signed)
Patient presents to the ED with lower back pain and pelvic pain and left sided abdominal pain x 1 year and patient states pain has been worse for the past month.  Patient states she has told her doctor about the pain but her doctor has not run any tests to attempt to learn the cause of patient's pain.  Patient is in no obvious distress at this time.  Patient denies vomiting and diarrhea.  Patient sitting comfortably in triage.  Patient denies vaginal bleeding and denies dysuria.  Patient reports hematuria that she noticed yesterday.

## 2015-11-11 NOTE — ED Provider Notes (Signed)
[facility] Emergency Department Provider Note  ____________________________________________  Time seen: Approximately 10:20 AM  I have reviewed the triage vital signs and the nursing notes.   HISTORY  Chief Complaint Pelvic Pain    HPI Mariah Richardson is a 30 y.o. female presents for evaluation of chronic pain in her pelvis.  Patient tells me that she didn't having pain in her lower pelvis for well over 2 years. She seen her doctor and mentioned this several times but they have not done any further evaluation. She is here requesting further evaluation for cause of discomfort in her lower pelvis.  She does report also that over the last 2 months she's developed a slight pain in the left flank that will come and go. She has noticed occasionally that she thinks she has a small amount of blood in her urine. She denies any nausea vomiting fevers or chills. She denies any persistent abdominal pain. No chest pain or trouble breathing.  She is not having any vaginal discharge, bleeding, or pain/abnormal odor.  She is asked twice, and she definitely reports the symptoms have an ongoing issue for a couple of months and she just wants some further evaluated. There has not been any acute change.  She has had a previous appendectomy and cholecystectomy. Past Medical History  Diagnosis Date  . Gastritis     There are no active problems to display for this patient.   Past Surgical History  Procedure Laterality Date  . Appendectomy    . Cholecystectomy      Current Outpatient Rx  Name  Route  Sig  Dispense  Refill  . loperamide (IMODIUM A-D) 2 MG tablet   Oral   Take 2 tablets (4 mg total) by mouth 4 (four) times daily as needed for diarrhea or loose stools.   30 tablet   0   . ondansetron (ZOFRAN ODT) 8 MG disintegrating tablet   Oral   Take 1 tablet (8 mg total) by mouth every 8 (eight) hours as needed for nausea or vomiting.   20 tablet   0   .  ranitidine (ZANTAC) 150 MG capsule   Oral   Take 1 capsule (150 mg total) by mouth 2 (two) times daily.   28 capsule   0     Allergies Review of patient's allergies indicates no known allergies.  No family history on file.  Social History Social History  Substance Use Topics  . Smoking status: Never Smoker   . Smokeless tobacco: None  . Alcohol Use: No    Review of Systems Constitutional: No fever/chills Eyes: No visual changes. ENT: No sore throat. Cardiovascular: Denies chest pain. Respiratory: Denies shortness of breath. Gastrointestinal:  No nausea, no vomiting.  No diarrhea.  No constipation. Genitourinary: Negative for dysuria. Musculoskeletal: Negative for back pain. Possibly occasionally seeing some slight blood in urine the last couple months, last seen yesterday cleared up today. Skin: Negative for rash. Neurological: Negative for headaches, focal weakness or numbness.  10-point ROS otherwise negative.  ____________________________________________   PHYSICAL EXAM:  VITAL SIGNS: ED Triage Vitals  Enc Vitals Group     BP 11/11/15 0850 127/69 mmHg     Pulse Rate 11/11/15 0850 100     Resp 11/11/15 0850 18     Temp 11/11/15 0850 98.5 F (36.9 C)     Temp Source 11/11/15 0850 Oral     SpO2 11/11/15 0850 97 %     Weight 11/11/15 0850 198 lb (  89.812 kg)     Height 11/11/15 0850  (1.575 m)     Head Cir --      Peak Flow --      Pain Score 11/11/15 0851 8     Pain Loc --      Pain Edu? --      Excl. in GC? --    Constitutional: Alert and oriented. Well appearing and in no acute distress. Eyes: Conjunctivae are normal. PERRL. EOMI. Head: Atraumatic. Nose: No congestion/rhinnorhea. Mouth/Throat: Mucous membranes are moist.  Oropharynx non-erythematous. Neck: No stridor.   Cardiovascular: Normal rate, regular rhythm. Grossly normal heart sounds.  Good peripheral circulation. Respiratory: Normal respiratory effort.  No retractions. Lungs  CTAB. Gastrointestinal: Soft and nontender. No distention. Minimal discomfort in the left flank to deep palpation.  No CVA tenderness. Musculoskeletal: No lower extremity tenderness nor edema.  No joint effusions. Neurologic:  Normal speech and language. No gross focal neurologic deficits are appreciated. Skin:  Skin is warm, dry and intact. No rash noted. Psychiatric: Mood and affect are normal. Speech and behavior are normal.  Very pleasant. Essentially normal exam except for some mild tenderness without rebound or guarding in the left flank. Amicable, no distress.  ____________________________________________   LABS (all labs ordered are listed, but only abnormal results are displayed)  Labs Reviewed  COMPREHENSIVE METABOLIC PANEL - Abnormal; Notable for the following:    Glucose, Bld 104 (*)    All other components within normal limits  URINALYSIS COMPLETEWITH MICROSCOPIC ([facility] ONLY) - Abnormal; Notable for the following:    Color, Urine YELLOW (*)    APPearance CLEAR (*)    Hgb urine dipstick 1+ (*)    Bacteria, UA RARE (*)    Squamous Epithelial / LPF 0-5 (*)    All other components within normal limits  LIPASE, BLOOD  CBC  POCT PREGNANCY, URINE   ____________________________________________  EKG   ____________________________________________  RADIOLOGY  US Renal (Final result) Result time: 11/11/15 11:34:45   Final result by Rad Results In Interface (11/11/15 11:34:45)   Narrative:   CLINICAL DATA: 30 year old female with intermittent left flank pain for the past 2 months as well as pelvic discomfort.  EXAM: RENAL / URINARY TRACT ULTRASOUND COMPLETE  COMPARISON: Prior CT scan 10/08/2012  FINDINGS: Right Kidney:  Length: 11.6 cm. Echogenicity within normal limits. No mass or hydronephrosis visualized.  Left Kidney:  Length: 11.8 cm. Echogenicity within normal limits. No mass or hydronephrosis visualized.  Bladder:  Appears normal for degree of  bladder distention. Both ureteral jets are identified.  IMPRESSION: Normal sonographic appearance of the kidneys.   Electronically Signed By: Malachy Moan M.D. On: 11/11/2015 11:34          US Transvaginal Non-OB (Final result) Result time: 11/11/15 11:34:21   Final result by Rad Results In Interface (11/11/15 11:34:21)   Narrative:   CLINICAL DATA: Left lower pelvic pain with symptoms for 2 years.  EXAM: TRANSABDOMINAL AND TRANSVAGINAL ULTRASOUND OF PELVIS  DOPPLER ULTRASOUND OF OVARIES  TECHNIQUE: Both transabdominal and transvaginal ultrasound examinations of the pelvis were performed. Transabdominal technique was performed for global imaging of the pelvis including uterus, ovaries, adnexal regions, and pelvic cul-de-sac.  It was necessary to proceed with endovaginal exam following the transabdominal exam to visualize the uterus, endometrium and ovaries to better advantage. Color and duplex Doppler ultrasound was utilized to evaluate blood flow to the ovaries.  COMPARISON: CT, 10/08/2012  FINDINGS: Uterus  Measurements: 11.5 x 4.5 x 6.2 cm. No  fibroids or other mass visualized.  Endometrium  Thickness: 12 mm. No focal abnormality visualized.  Right ovary  Measurements: 3.8 x 3.1 x 4.1 cm. 2.9 cm simple appearing cyst consistent with a follicular cyst. No adnexal masses.  Left ovary  Measurements: 3.1 x 2.2 x 2.0 cm. Normal appearance/no adnexal mass.  Pulsed Doppler evaluation of both ovaries demonstrates normal low-resistance arterial and venous waveforms.  Other findings  No abnormal free fluid.  IMPRESSION: 1. No acute findings. No ovarian torsion. 2. 2.9 cm simple appearing right ovarian cyst. Otherwise unremarkable.   Electronically Signed By: Amie Portlandavid Ormond M.D. On: 11/11/2015 11:34    ____________________________________________   PROCEDURES  Procedure(s) performed: None  Critical Care performed:  No  ____________________________________________   INITIAL IMPRESSION / ASSESSMENT AND PLAN / ED COURSE  Pertinent labs & imaging results that were available during my care of the patient were reviewed by me and considered in my medical decision making (see chart for details).  Patient presents for evaluation of somewhat chronic pelvic and discomfort with some mild questionable hematuria off and on for the last 2 months a slight discomfort in the left flank. I doubt any acute intra-abdominal process such as surgical abdomen, there is a certainly an element of chronicity here and the patient reports that she is here because her primary has not taken action on her concerns over the last several months.  I find no evidence that she is unstable. She is awake alert with normal hemodynamics. I did discuss with her and based on her ongoing symptoms and request for further evaluation we will use ultrasound, avoid radiation to further evaluate. I'll obtain ultrasound of the renal system due to her complaint of some mild left flank discomfort and hematuria as well as transvaginal ultrasound to evaluate for ovarian or pelvic abnormality.  If the ultrasounds are unrevealing, likely referred back to the primary for further workup and evaluation. She appears quite stable at this time.  ----------------------------------------- 11:59 AM on 11/11/2015 -----------------------------------------  No obvious explanation to explain patient's rather chronic lower abdominal discomfort. At this point we'll refer her back to primary care for further evaluation. No abdominal pain return precautions discussed. She currently resting comfortably in no distress, agreeable with close primary care follow-up.  Return precautions and treatment recommendations and follow-up discussed with the patient who is agreeable with the plan.   ____________________________________________   FINAL CLINICAL IMPRESSION(S) / ED  DIAGNOSES  Final diagnoses:  Chronic female pelvic pain      Sharyn CreamerMark Linde Wilensky, MD 11/11/15 1201

## 2015-11-11 NOTE — ED Notes (Signed)
NAD noted at time of D/C. Pt denies questions or concerns. Pt ambulatory to the lobby at this time.  

## 2015-11-11 NOTE — ED Notes (Addendum)
Pt returned from US

## 2015-11-11 NOTE — ED Notes (Signed)
Pt transported to US at this time. 

## 2015-11-11 NOTE — Discharge Instructions (Signed)
You were seen in the emergency room for abdominal pain. It is important that you follow up closely with your primary care doctor in the next couple of days. ° ° °Please return to the emergency room right away if you are to develop a fever, severe nausea, your pain becomes severe or worsens, you are unable to keep food down, begin vomiting any dark or bloody fluid, you develop any dark or bloody stools, feel dehydrated, or other new concerns or symptoms arise. ° ° °

## 2016-02-27 ENCOUNTER — Emergency Department: Payer: Medicaid Other

## 2016-02-27 ENCOUNTER — Emergency Department
Admission: EM | Admit: 2016-02-27 | Discharge: 2016-02-27 | Disposition: A | Discharge status: Home / Self Care | Payer: Medicaid Other | Attending: Emergency Medicine | Admitting: Emergency Medicine

## 2016-02-27 DIAGNOSIS — R1030 Lower abdominal pain, unspecified: Secondary | ICD-10-CM | POA: Diagnosis present

## 2016-02-27 DIAGNOSIS — N390 Urinary tract infection, site not specified: Secondary | ICD-10-CM | POA: Diagnosis not present

## 2016-02-27 DIAGNOSIS — Z791 Long term (current) use of non-steroidal anti-inflammatories (NSAID): Secondary | ICD-10-CM | POA: Insufficient documentation

## 2016-02-27 LAB — COMPREHENSIVE METABOLIC PANEL
ALT: 13 U/L — ABNORMAL LOW (ref 14–54)
AST: 25 U/L (ref 15–41)
Albumin: 3.8 g/dL (ref 3.5–5.0)
Alkaline Phosphatase: 47 U/L (ref 38–126)
Anion gap: 5 (ref 5–15)
BUN: 8 mg/dL (ref 6–20)
CO2: 25 mmol/L (ref 22–32)
Calcium: 8.8 mg/dL — ABNORMAL LOW (ref 8.9–10.3)
Chloride: 105 mmol/L (ref 101–111)
Creatinine, Ser: 0.6 mg/dL (ref 0.44–1.00)
GFR calc Af Amer: >60 mL/min (ref >60)
GFR calc non Af Amer: >60 mL/min (ref >60)
Glucose, Bld: 101 mg/dL — ABNORMAL HIGH (ref 65–99)
Potassium: 5.7 mmol/L — ABNORMAL HIGH (ref 3.5–5.1)
Sodium: 135 mmol/L (ref 135–145)
Total Bilirubin: 0.7 mg/dL (ref 0.3–1.2)
Total Protein: 7.2 g/dL (ref 6.5–8.1)

## 2016-02-27 LAB — CBC WITH DIFFERENTIAL/PLATELET
Basophils Absolute: 0 10*3/uL (ref 0–0.1)
Basophils Relative: 1 %
Eosinophils Absolute: 0.1 10*3/uL (ref 0–0.7)
Eosinophils Relative: 1 %
HCT: 40.3 % (ref 35.0–47.0)
Hemoglobin: 13.7 g/dL (ref 12.0–16.0)
Lymphocytes Relative: 28 %
Lymphs Abs: 2 10*3/uL (ref 1.0–3.6)
MCH: 29.3 pg (ref 26.0–34.0)
MCHC: 34 g/dL (ref 32.0–36.0)
MCV: 86.1 fL (ref 80.0–100.0)
Monocytes Absolute: 0.7 10*3/uL (ref 0.2–0.9)
Monocytes Relative: 10 %
Neutro Abs: 4.5 10*3/uL (ref 1.4–6.5)
Neutrophils Relative %: 60 %
Platelets: 265 10*3/uL (ref 150–440)
RBC: 4.68 MIL/uL (ref 3.80–5.20)
RDW: 13.2 % (ref 11.5–14.5)
WBC: 7.3 10*3/uL (ref 3.6–11.0)

## 2016-02-27 LAB — URINALYSIS COMPLETE WITH MICROSCOPIC (ARMC ONLY)
Bilirubin Urine: NEGATIVE
Glucose, UA: NEGATIVE mg/dL
Ketones, ur: NEGATIVE mg/dL
Nitrite: NEGATIVE
Protein, ur: 30 mg/dL — AB
Specific Gravity, Urine: 1.018 (ref 1.005–1.030)
pH: 6 (ref 5.0–8.0)

## 2016-02-27 LAB — PREGNANCY, URINE: Preg Test, Ur: NEGATIVE

## 2016-02-27 LAB — WET PREP, GENITAL
Clue Cells Wet Prep HPF POC: NONE SEEN
Sperm: NONE SEEN
Trich, Wet Prep: NONE SEEN
Yeast Wet Prep HPF POC: NONE SEEN

## 2016-02-27 LAB — FIBRIN DERIVATIVES D-DIMER (ARMC ONLY): Fibrin derivatives D-dimer (ARMC): 117 (ref 0–499)

## 2016-02-27 LAB — LIPASE, BLOOD: Lipase: 20 U/L (ref 11–51)

## 2016-02-27 LAB — POCT PREGNANCY, URINE: Preg Test, Ur: NEGATIVE

## 2016-02-27 LAB — CHLAMYDIA/NGC RT PCR (ARMC ONLY)
Chlamydia Tr: NOT DETECTED
N gonorrhoeae: NOT DETECTED

## 2016-02-27 MED ORDER — CEPHALEXIN 500 MG PO CAPS: 500.0000 mg | ORAL_CAPSULE | Freq: Two times a day (BID) | ORAL | 0 refills | Status: DC

## 2016-02-27 NOTE — ED Provider Notes (Signed)
[facility] Emergency Department Provider Note ____________________________________________  Time seen: Approximately 8:55 AM I have reviewed the triage vital signs and the triage nursing note.  HISTORY  Chief Complaint Abdominal Pain; Shortness of Breath; and Palpitations   Historian Patient  HPI Mariah Richardson is a 30 y.o. female who is here for multiple complaints. She states that she is here for shortness of breath and intermittent chest pain that is sharp and/or pressure and not necessarily associated with exertion for the past several days. She states that she did not go to her primary care doctor's office because she states that they never do anything for her. She also indicates that she has had many months of lower abdominal cramping type pain that is not necessarily associated with her periods, or bowel movements although she often has diarrhea. She reports many months of feeling like her legs are swollen, and that her arms and legs are tingling.  No prior diagnosis of pulmonary embolism. No fevers recently. She reports that she is under a fair amount of stress with her children and her boyfriend, but no new change in this. No depression.    Past Medical History:  Diagnosis Date  . Gastritis     There are no active problems to display for this patient.   Past Surgical History:  Procedure Laterality Date  . APPENDECTOMY    . CHOLECYSTECTOMY      Prior to Admission medications   Medication Sig Start Date End Date Taking? Authorizing Provider  ibuprofen (ADVIL,MOTRIN) 200 MG tablet Take 200 mg by mouth every 6 (six) hours as needed.   Yes Historical Provider, MD  cephALEXin (KEFLEX) 500 MG capsule Take 1 capsule (500 mg total) by mouth 2 (two) times daily. 02/27/16   Governor Rooks, MD    No Known Allergies  No family history on file.  Social History Social History  Substance Use Topics  . Smoking status: Never Smoker  . Smokeless tobacco:  Never Used  . Alcohol use No    Review of Systems  Constitutional: Negative for fever. Eyes: Negative for visual changes. ENT: Negative for sore throat. Cardiovascular: Negative for Palpitations. Respiratory: Negative for cough. Gastrointestinal: Negative for  vomitin. Genitourinary: Negative for dysuria. Musculoskeletal: Negative for back pain. Skin: Negative for rash. Neurological: Negative for headache. 10 point Review of Systems otherwise negative ____________________________________________   PHYSICAL EXAM:  VITAL SIGNS: ED Triage Vitals [02/27/16 0809]  Enc Vitals Group     BP 129/69     Pulse Rate 92     Resp 18     Temp 98.2 F (36.8 C)     Temp Source Oral     SpO2 96 %     Weight 185 lb (83.9 kg)     Height  (1.651 m)     Head Circumference      Peak Flow      Pain Score      Pain Loc      Pain Edu?      Excl. in GC?      Constitutional: Alert and oriented. Well appearing and in no distress. HEENT   Head: Normocephalic and atraumatic.      Eyes: Conjunctivae are normal. PERRL. Normal extraocular movements.      Ears:         Nose: No congestion/rhinnorhea.   Mouth/Throat: Mucous membranes are moist.   Neck: No stridor. Cardiovascular/Chest: Normal rate, regular rhythm.  No murmurs, rubs, or gallops. Respiratory: Normal  respiratory effort without tachypnea nor retractions. Breath sounds are clear and equal bilaterally. No wheezes/rales/rhonchi. Gastrointestinal: Soft. No distention, no guarding, no rebound. Very mild tenderness in the low pelvic area.  Genitourinary/rectal:Small amount of white discharge, no cervicitis. Nontender pelvic exam. Musculoskeletal: Nontender with normal range of motion in all extremities. No joint effusions.  No lower extremity tenderness.  No edema. Neurologic:  Normal speech and language. No gross or focal neurologic deficits are appreciated. Skin:  Skin is warm, dry and intact. No rash noted. Psychiatric:  Mood and affect are normal. Speech and behavior are normal. Patient exhibits appropriate insight and judgment.  ____________________________________________   EKG I, Governor Rooksebecca Abdiaziz Klahn, MD, the attending physician have personally viewed and interpreted all ECGs.  85 bpm. Normal sinus rhythm. Narrow QRS. Normal ST and T-wave. ____________________________________________  LABS (pertinent positives/negatives)  Labs Reviewed  WET PREP, GENITAL - Abnormal; Notable for the following:       Result Value   WBC, Wet Prep HPF POC FEW (*)    All other components within normal limits  URINALYSIS COMPLETEWITH MICROSCOPIC ([facility] ONLY) - Abnormal; Notable for the following:    Color, Urine YELLOW (*)    APPearance CLOUDY (*)    Hgb urine dipstick 1+ (*)    Protein, ur 30 (*)    Leukocytes, UA 2+ (*)    Bacteria, UA MANY (*)    Squamous Epithelial / LPF TOO NUMEROUS TO COUNT (*)    All other components within normal limits  COMPREHENSIVE METABOLIC PANEL - Abnormal; Notable for the following:    Potassium 5.7 (*)    Glucose, Bld 101 (*)    Calcium 8.8 (*)    ALT 13 (*)    All other components within normal limits  CHLAMYDIA/NGC RT PCR ([facility] ONLY)  URINE CULTURE  PREGNANCY, URINE  LIPASE, BLOOD  CBC WITH DIFFERENTIAL/PLATELET  FIBRIN DERIVATIVES D-DIMER ([facility] ONLY)  POCT PREGNANCY, URINE    ____________________________________________  RADIOLOGY All Xrays were viewed by me. Imaging interpreted by Radiologist.  Chest x-ray two-view:  No active cardiopulmonary disease __________________________________________  PROCEDURES  Procedure(s) performed: None  Critical Care performed: None  ____________________________________________   ED COURSE / ASSESSMENT AND PLAN  Pertinent labs & imaging results that were available during my care of the patient were reviewed by me and considered in my medical decision making (see chart for details).   Mariah Richardson is here for numerous  complaints, several which sound like have been ongoing for months if not years without a certain cause found by PCP. These complaints include occasional leg swelling, leg and arm tingling at times. Diarrhea and lower abdominal cramping.  It sounds like the reason she came here is 3 days of some chest discomfort and shortness of breath, and denies cardiac or pulmonary history.  Symptoms sound unlikely to be ACS. Unlikely to be pneumonia clinically. Unlikely to be PE based on symptoms and normal oxygenation.  With reassuring emergency department evaluation, I am referring her back to her primary care physician.  We did discuss stress management, and recommended she may discuss with primary care physician arranging to see a therapist or counselor. In addition we discussed the possibility of dietary intolerance including dairy including. I recommended considering an elimination diet for 3-4 weeks to see if some of her chronic possibly inflammatory symptoms would improve.  I reviewed her results, and the urinalysis looks like it could be possible contamination, but with symptoms I am going to start her on Keflex and sent a  culture.  The rest of her workup including blood work and chest x-ray are reassuring. In terms of the intermittent chest discomfort and occasional shortness breath, no certain cause was found, however I'm not suspicious of PE, nor is there any evidence of pneumonia, asthma, CHF, or any other emergency cause.    CONSULTATIONS:   None   Patient / Family / Caregiver informed of clinical course, medical decision-making process, and agree with plan.   I discussed return precautions, follow-up instructions, and discharged instructions with patient and/or family.   ___________________________________________   FINAL CLINICAL IMPRESSION(S) / ED DIAGNOSES   Final diagnoses:  UTI (lower urinary tract infection)              Note: This dictation was prepared with  Dragon dictation. Any transcriptional errors that result from this process are unintentional    Governor Rooks, MD 02/27/16 1147

## 2016-02-27 NOTE — ED Triage Notes (Signed)
Pt c/o feeling her heart beating fast intermittently with SOB and lower abd pain with nausea since Sunday..Marland Kitchen

## 2016-02-27 NOTE — Discharge Instructions (Signed)
You are being treated for urinary tract infection with antibiotic Keflex.  As we discussed, for a number of your other ongoing complaints including occasional swelling, intermittent diarrhea, tingling of the extremities, please follow up with her primary care physician.  We did discuss consideration of Elimination Diet (avoid dairy and gluten) for 3 weeks to see if you have some improvement.  For stressors, discussed with her primary care physician referral to counselor to help with stress management.  No certain cause was found for your chest discomfort and shortness of breath, however your exam and evaluation are reassuring in emergency department today. Return to emergency department any worsening condition for any fever, cough, trouble breathing, worsening chest pain, palpitations, dizziness or passing out, weakness or numbness, or any other symptoms concerning to you.

## 2016-02-28 LAB — URINE CULTURE

## 2016-07-30 DIAGNOSIS — H409 Unspecified glaucoma: Secondary | ICD-10-CM | POA: Insufficient documentation

## 2016-07-30 LAB — HM HIV SCREENING LAB: HM HIV Screening: NEGATIVE

## 2017-02-28 DIAGNOSIS — R768 Other specified abnormal immunological findings in serum: Secondary | ICD-10-CM | POA: Insufficient documentation

## 2017-05-23 ENCOUNTER — Emergency Department
Admission: EM | Admit: 2017-05-23 | Discharge: 2017-05-23 | Disposition: A | Discharge status: Home / Self Care | Payer: Medicaid Other | Attending: Emergency Medicine | Admitting: Emergency Medicine

## 2017-05-23 ENCOUNTER — Encounter: Payer: Self-pay | Admitting: Emergency Medicine

## 2017-05-23 DIAGNOSIS — Z5321 Procedure and treatment not carried out due to patient leaving prior to being seen by health care provider: Secondary | ICD-10-CM | POA: Insufficient documentation

## 2017-05-23 DIAGNOSIS — R1030 Lower abdominal pain, unspecified: Secondary | ICD-10-CM | POA: Insufficient documentation

## 2017-05-23 LAB — URINALYSIS, COMPLETE (UACMP) WITH MICROSCOPIC
Bilirubin Urine: NEGATIVE
Glucose, UA: NEGATIVE mg/dL
Hgb urine dipstick: NEGATIVE
Ketones, ur: NEGATIVE mg/dL
Leukocytes, UA: NEGATIVE
Nitrite: NEGATIVE
Protein, ur: NEGATIVE mg/dL
Specific Gravity, Urine: 1.006 (ref 1.005–1.030)
pH: 6 (ref 5.0–8.0)

## 2017-05-23 LAB — POCT PREGNANCY, URINE: Preg Test, Ur: POSITIVE — AB

## 2017-05-23 NOTE — ED Notes (Signed)
Attempted to find pt to reassess vitals. Pt not found in ED

## 2017-05-23 NOTE — ED Triage Notes (Signed)
Pt states that she is approximately [redacted] weeks pregnant, has been having cramping in her lower abdomen for the past several days. Today when she woke up pt is having right flank pain. Pt denies any other symptoms at this time. Pt in NAD

## 2017-05-23 NOTE — ED Triage Notes (Signed)
FIRST NURSE NOTE-here for abdominal cramping and [redacted] wk pregnant. NAD. Ambulatory.

## 2017-05-26 ENCOUNTER — Telehealth (HOSPITAL_COMMUNITY): Payer: Self-pay | Admitting: Emergency Medicine

## 2017-05-26 NOTE — Telephone Encounter (Signed)
Called patient due to lwot to inquire about condition and follow up plans. Number did not work 

## 2017-06-10 LAB — OB RESULTS CONSOLE RUBELLA ANTIBODY, IGM: Rubella: IMMUNE

## 2017-06-10 LAB — OB RESULTS CONSOLE VARICELLA ZOSTER ANTIBODY, IGG: Varicella: IMMUNE

## 2017-06-10 LAB — OB RESULTS CONSOLE RPR: RPR: REACTIVE

## 2017-06-10 LAB — OB RESULTS CONSOLE HEPATITIS B SURFACE ANTIGEN: Hepatitis B Surface Ag: NEGATIVE

## 2017-06-24 DIAGNOSIS — A530 Latent syphilis, unspecified as early or late: Secondary | ICD-10-CM | POA: Insufficient documentation

## 2017-06-30 DIAGNOSIS — F1291 Cannabis use, unspecified, in remission: Secondary | ICD-10-CM | POA: Insufficient documentation

## 2017-06-30 DIAGNOSIS — Z87898 Personal history of other specified conditions: Secondary | ICD-10-CM | POA: Insufficient documentation

## 2017-07-19 ENCOUNTER — Encounter: Payer: Self-pay | Admitting: Emergency Medicine

## 2017-07-19 ENCOUNTER — Emergency Department
Admission: EM | Admit: 2017-07-19 | Discharge: 2017-07-19 | Discharge status: Against Medical Advice | Payer: Medicaid Other | Attending: Emergency Medicine | Admitting: Emergency Medicine

## 2017-07-19 ENCOUNTER — Other Ambulatory Visit: Payer: Self-pay

## 2017-07-19 DIAGNOSIS — Z3A13 13 weeks gestation of pregnancy: Secondary | ICD-10-CM | POA: Insufficient documentation

## 2017-07-19 DIAGNOSIS — Z5321 Procedure and treatment not carried out due to patient leaving prior to being seen by health care provider: Secondary | ICD-10-CM | POA: Insufficient documentation

## 2017-07-19 DIAGNOSIS — O209 Hemorrhage in early pregnancy, unspecified: Secondary | ICD-10-CM | POA: Insufficient documentation

## 2017-07-19 LAB — HCG, QUANTITATIVE, PREGNANCY: hCG, Beta Chain, Quant, S: 81609 m[IU]/mL — ABNORMAL HIGH (ref <5)

## 2017-07-19 LAB — ABO/RH: ABO/RH(D): B POS

## 2017-07-19 NOTE — ED Notes (Signed)
No answer when called for treatment room.  ?

## 2017-07-19 NOTE — ED Notes (Signed)
No answer when called 

## 2017-07-19 NOTE — ED Triage Notes (Signed)
Pt to ED via POV, pt states that she is [redacted] weeks pregnant. Pt states that she is having brownish color discharged and that she is having cramping in her lower abdomen and back. Pt states that her symptoms started today.

## 2017-07-20 LAB — OB RESULTS CONSOLE GC/CHLAMYDIA
Chlamydia: NEGATIVE
Gonorrhea: NEGATIVE

## 2017-07-20 LAB — OB RESULTS CONSOLE GBS: GBS: POSITIVE

## 2017-07-22 NOTE — L&D Delivery Note (Addendum)
Date of delivery: 01/05/2018 Estimated Date of Delivery: 01/15/18 No LMP recorded. Patient is pregnant. EGA: [id number]  Delivery Note At 4:06 AM a viable female was delivered via Vaginal, Spontaneous (Presentation: OA;  ROA).  APGAR: 8, 9; weight 3070 g .   Placenta status: spontaneous, intact.  Cord:  with the following complications: none.  Cord pH: NA  Called to see patient.  Mom pushed to deliver a viable female infant.  The head followed by shoulders, which delivered without difficulty, and the rest of the body.  No nuchal cord noted.  Baby to mom's chest.  Cord clamped and cut after 3 min delay.  No cord blood obtained.  Placenta delivered spontaneously, intact, with a 3-vessel cord.  Perineum intact.  All counts correct.  Hemostasis obtained with IV pitocin and fundal massage.   Anesthesia:  epidural Episiotomy: None Lacerations: None Suture Repair: NA Est. Blood Loss (mL):  200  Mom to postpartum.  Baby to Couplet care / Skin to Skin.  Tresea MallJane Kida Digiulio, CNM 01/05/2018, 4:39 AM  Attestation of Attending Supervision of Advanced Practitioner (CNM):  Evaluation and management procedures were performed by the Advanced Practitioner under my supervision and collaboration.  Although I was not present for the delivery itself,  I have reviewed the Advanced Practitioner's note and chart, and I agree with the management and plan.  Annamarie MajorPaul Harris, MD, Merlinda FrederickFACOG [facility], [facility] 01/05/2018  7:09 AM

## 2017-11-03 LAB — OB RESULTS CONSOLE HIV ANTIBODY (ROUTINE TESTING): HIV: NONREACTIVE

## 2017-12-09 ENCOUNTER — Observation Stay
Admission: EM | Admit: 2017-12-09 | Discharge: 2017-12-09 | Disposition: A | Discharge status: Home / Self Care | Payer: Medicaid Other | Attending: Obstetrics and Gynecology | Admitting: Obstetrics and Gynecology

## 2017-12-09 ENCOUNTER — Other Ambulatory Visit: Payer: Self-pay

## 2017-12-09 DIAGNOSIS — O26893 Other specified pregnancy related conditions, third trimester: Secondary | ICD-10-CM | POA: Diagnosis not present

## 2017-12-09 DIAGNOSIS — Z3A33 33 weeks gestation of pregnancy: Secondary | ICD-10-CM

## 2017-12-09 DIAGNOSIS — R51 Headache: Secondary | ICD-10-CM | POA: Diagnosis not present

## 2017-12-09 MED ORDER — PSEUDOEPHEDRINE HCL 30 MG PO TABS
30.0000 mg | ORAL_TABLET | Freq: Once | ORAL | Status: AC
  Administered 2017-12-09: 30 mg via ORAL
  Filled 2017-12-09: qty 1

## 2017-12-09 MED ORDER — ACETAMINOPHEN 325 MG PO TABS
650.0000 mg | ORAL_TABLET | ORAL | Status: DC | PRN
  Administered 2017-12-09: 650 mg via ORAL
  Filled 2017-12-09: qty 2

## 2017-12-09 NOTE — Discharge Instructions (Signed)
Sinus Rinse What is a sinus rinse? A sinus rinse is a home treatment. It rinses your sinuses with a mixture of salt and water (saline solution). Sinuses are air-filled spaces in your skull behind the bones of your face and forehead. They open into your nasal cavity. To do a sinus rinse, you will need:  Saline solution.  Neti pot or spray bottle. This releases the saline solution into your nose and through your sinuses. You can buy neti pots and spray bottles at: ? [employer] fund[employer]. ? A health food store. ? Online.  When should I do a sinus rinse? A sinus rinse can help to clear your nasal cavity. It can clear:  Mucus.  Dirt.  Dust.  Pollen.  You may do a sinus rinse when you have:  A cold.  A virus.  Allergies.  A sinus infection.  A stuffy nose.  If you are considering a sinus rinse:  Ask your child's doctor before doing a sinus rinse on your child.  Do not do a sinus rinse if you have had: ? Ear or nasal surgery. ? An ear infection. ? Blocked ears.  How do I do a sinus rinse?  Wash your hands.  Disinfect your device using the directions that came with the device.  Dry your device.  Use the solution that comes with your device or one that is sold separately in stores. Follow the mixing directions on the package.  Fill your device with the amount of saline solution as stated in the device instructions.  Stand over a sink and tilt your head sideways over the sink.  Place the spout of the device in your upper nostril (the one closer to the ceiling).  Gently pour or squeeze the saline solution into the nasal cavity. The liquid should drain to the lower nostril if you are not too congested.  Gently blow your nose. Blowing too hard may cause ear pain.  Repeat in the other nostril.  Clean and rinse your device with clean water.  Air-dry your device. Are there risks of a sinus rinse? Sinus rinse is normally very safe and helpful. However, there are a  few risks, which include:  A burning feeling in the sinuses. This may happen if you do not make the saline solution as instructed. Make sure to follow all directions when making the saline solution.  Infection from unclean water. This is rare, but possible.  Nasal irritation.  This information is not intended to replace advice given to you by your health care provider. Make sure you discuss any questions you have with your health care provider. Document Released: 02/02/2014 Document Revised: 06/04/2016 Document Reviewed: 11/23/2013 Elsevier Interactive Patient Education  2017 [employer].   You can use over the counter allergy medication or ask pharmacist for sudafed Drink at least 8 glasses of water every day

## 2017-12-09 NOTE — Discharge Summary (Signed)
Physician Final Progress Note  Patient ID: Mariah Richardson MRN: [id number] DOB/AGE: [birth year] 31 y.o.  Admit date: 12/09/2017 Admitting provider: Vena Austria, MD Discharge date: 12/09/2017   Admission Diagnoses: headache, sinus pressure, irregular contractions  Discharge Diagnoses:  Active Problems:   Indication for care in labor and delivery, antepartum 32 yo G3P2002 at 33 weeks 6 days with reactive NST, not in labor, sinus headache  History of Present Illness: The patient is a 32 y.o. female G3P2002 at [redacted]w[redacted]d who presents for having headache related to sinus pressure for the past 2 weeks. She was told to take tylenol with benadryl which she did without relief. She also has contractions that occur every 30-60 minutes that last for a few seconds for the past couple of days. She admits positive fetal movement. She denies leakage of fluid or vaginal bleeding. She receives her regular prenatal care at [facility].    Past Medical History:  Diagnosis Date  . Gastritis     Past Surgical History:  Procedure Laterality Date  . APPENDECTOMY    . CHOLECYSTECTOMY      No current facility-administered medications on file prior to encounter.    Current Outpatient Medications on File Prior to Encounter  Medication Sig Dispense Refill  . acetaminophen (TYLENOL) 500 MG tablet Take 500 mg by mouth every 6 (six) hours as needed.    . diphenhydrAMINE (BENADRYL) 25 mg capsule Take 25 mg by mouth every 6 (six) hours as needed.    . cephALEXin (KEFLEX) 500 MG capsule Take 1 capsule (500 mg total) by mouth 2 (two) times daily. (Patient not taking: Reported on 12/09/2017) 14 capsule 0  . ibuprofen (ADVIL,MOTRIN) 200 MG tablet Take 200 mg by mouth every 6 (six) hours as needed.    . Prenatal Vit-Fe Fumarate-FA (MULTIVITAMIN-PRENATAL) 27-0.8 MG TABS tablet Take 1 tablet by mouth daily at 12 noon.      No Known Allergies  Social History   Socioeconomic History  . Marital status: Single    Spouse  name: Not on file  . Number of children: Not on file  . Years of education: Not on file  . Highest education level: Not on file  Occupational History  . Not on file  Social Needs  . Financial resource strain: Not on file  . Food insecurity:    Worry: Not on file    Inability: Not on file  . Transportation needs:    Medical: Not on file    Non-medical: Not on file  Tobacco Use  . Smoking status: Never Smoker  . Smokeless tobacco: Never Used  Substance and Sexual Activity  . Alcohol use: No  . Drug use: No  . Sexual activity: Yes  Lifestyle  . Physical activity:    Days per week: Not on file    Minutes per session: Not on file  . Stress: Not on file  Relationships  . Social connections:    Talks on phone: Not on file    Gets together: Not on file    Attends religious service: Not on file    Active member of club or organization: Not on file    Attends meetings of clubs or organizations: Not on file    Relationship status: Not on file  . Intimate partner violence:    Fear of current or ex partner: Not on file    Emotionally abused: Not on file    Physically abused: Not on file    Forced sexual activity: Not on file  Other Topics Concern  . Not on file  Social History Narrative  . Not on file    Physical Exam: BP 134/66 (BP Location: Right Arm)   Pulse 100   Temp 98 F (36.7 C) (Oral)   Resp 16   Ht  (1.575 m)   Wt 217 lb (98.4 kg)   SpO2 99%   BMI 39.69 kg/m   Gen: NAD CV: RRR Pulm: CTAB Pelvic: deferred Toco: irritability  Fetal well being: baseline 135, moderate variability, +accelerations, -decelerations Ext: 2+ reflexes  Consults: None  Significant Findings/ Diagnostic Studies: none  Procedures: NST  Discharge Condition: good  Disposition: Discharge disposition: 01-Home or Self Care       Diet: Regular diet  Discharge Activity: Activity as tolerated  Discharge Instructions    Discharge activity:  No Restrictions   Complete by:   As directed    Discharge diet:  No restrictions   Complete by:  As directed    Fetal Kick Count:  Lie on our left side for one hour after a meal, and count the number of times your baby kicks.  If it is less than 5 times, get up, move around and drink some juice.  Repeat the test 30 minutes later.  If it is still less than 5 kicks in an hour, notify your doctor.   Complete by:  As directed    No sexual activity restrictions   Complete by:  As directed    Notify physician for a general feeling that "something is not right"   Complete by:  As directed    Notify physician for increase or change in vaginal discharge   Complete by:  As directed    Notify physician for intestinal cramps, with or without diarrhea, sometimes described as "gas pain"   Complete by:  As directed    Notify physician for leaking of fluid   Complete by:  As directed    Notify physician for low, dull backache, unrelieved by heat or Tylenol   Complete by:  As directed    Notify physician for menstrual like cramps   Complete by:  As directed    Notify physician for pelvic pressure   Complete by:  As directed    Notify physician for uterine contractions.  These may be painless and feel like the uterus is tightening or the baby is  "balling up"   Complete by:  As directed    Notify physician for vaginal bleeding   Complete by:  As directed    PRETERM LABOR:  Includes any of the follwing symptoms that occur between 20 - [redacted] weeks gestation.  If these symptoms are not stopped, preterm labor can result in preterm delivery, placing your baby at risk   Complete by:  As directed      Allergies as of 12/09/2017   No Known Allergies     Medication List    STOP taking these medications   cephALEXin 500 MG capsule Commonly known as:  KEFLEX   ibuprofen 200 MG tablet Commonly known as:  ADVIL,MOTRIN     TAKE these medications   acetaminophen 500 MG tablet Commonly known as:  TYLENOL Take 500 mg by mouth every 6 (six)  hours as needed.   diphenhydrAMINE 25 mg capsule Commonly known as:  BENADRYL Take 25 mg by mouth every 6 (six) hours as needed.   multivitamin-prenatal 27-0.8 MG Tabs tablet Take 1 tablet by mouth daily at 12 noon.  Pseudoephedrine as needed for nasal congestion if blood pressure is not elevated Saline nasal spray for sinus congestion Increase hydration to decrease incidence of cramping/contractions/sinus congestion/headache  Follow-up Information    [facility], [place]. Go to.   Why:  regular scheduled prenatal appointment          Total time spent taking care of this patient: 15 minutes  Signed: Tresea Mall, CNM  12/09/2017, 9:31 AM

## 2017-12-09 NOTE — OB Triage Note (Signed)
Pt presents c/o sinus pressure and headache that started 2 weeks ago. Pt states she has been taking benadryl and tylenol with no relief. Pt denies any NVD, bleeding or LOF. Reports positive fetal movement. Pt states she has been having some contractions that she think may be related to her not feeling well. Pt states she has had yellowish/green nasal discharge. Vitals WNL. Will continue to monitor.

## 2017-12-24 ENCOUNTER — Observation Stay
Admission: EM | Admit: 2017-12-24 | Discharge: 2017-12-24 | Disposition: A | Discharge status: Home / Self Care | Payer: Medicaid Other | Attending: Obstetrics & Gynecology | Admitting: Obstetrics & Gynecology

## 2017-12-24 ENCOUNTER — Other Ambulatory Visit: Payer: Self-pay

## 2017-12-24 DIAGNOSIS — O26893 Other specified pregnancy related conditions, third trimester: Secondary | ICD-10-CM

## 2017-12-24 DIAGNOSIS — O163 Unspecified maternal hypertension, third trimester: Secondary | ICD-10-CM | POA: Insufficient documentation

## 2017-12-24 DIAGNOSIS — Z3A36 36 weeks gestation of pregnancy: Secondary | ICD-10-CM

## 2017-12-24 DIAGNOSIS — R51 Headache: Secondary | ICD-10-CM | POA: Diagnosis not present

## 2017-12-24 DIAGNOSIS — Z7982 Long term (current) use of aspirin: Secondary | ICD-10-CM | POA: Insufficient documentation

## 2017-12-24 LAB — CBC
HCT: 35 % (ref 35.0–47.0)
Hemoglobin: 11.8 g/dL — ABNORMAL LOW (ref 12.0–16.0)
MCH: 26.7 pg (ref 26.0–34.0)
MCHC: 33.7 g/dL (ref 32.0–36.0)
MCV: 79.4 fL — ABNORMAL LOW (ref 80.0–100.0)
Platelets: 310 10*3/uL (ref 150–440)
RBC: 4.41 MIL/uL (ref 3.80–5.20)
RDW: 15.1 % — ABNORMAL HIGH (ref 11.5–14.5)
WBC: 15.1 10*3/uL — ABNORMAL HIGH (ref 3.6–11.0)

## 2017-12-24 LAB — COMPREHENSIVE METABOLIC PANEL
ALT: 18 U/L (ref 14–54)
AST: 20 U/L (ref 15–41)
Albumin: 3 g/dL — ABNORMAL LOW (ref 3.5–5.0)
Alkaline Phosphatase: 78 U/L (ref 38–126)
Anion gap: 8 (ref 5–15)
BUN: 8 mg/dL (ref 6–20)
CO2: 21 mmol/L — ABNORMAL LOW (ref 22–32)
Calcium: 8.5 mg/dL — ABNORMAL LOW (ref 8.9–10.3)
Chloride: 105 mmol/L (ref 101–111)
Creatinine, Ser: 0.43 mg/dL — ABNORMAL LOW (ref 0.44–1.00)
GFR calc Af Amer: >60 mL/min (ref >60)
GFR calc non Af Amer: >60 mL/min (ref >60)
Glucose, Bld: 92 mg/dL (ref 65–99)
Potassium: 3.9 mmol/L (ref 3.5–5.1)
Sodium: 134 mmol/L — ABNORMAL LOW (ref 135–145)
Total Bilirubin: 0.4 mg/dL (ref 0.3–1.2)
Total Protein: 7.6 g/dL (ref 6.5–8.1)

## 2017-12-24 LAB — PROTEIN / CREATININE RATIO, URINE
Creatinine, Urine: 55 mg/dL
Protein Creatinine Ratio: 0.2 mg/mg{Cre} — ABNORMAL HIGH (ref 0.00–0.15)
Total Protein, Urine: 11 mg/dL

## 2017-12-24 MED ORDER — BUTALBITAL-APAP-CAFFEINE 50-325-40 MG PO TABS
2.0000 | ORAL_TABLET | ORAL | Status: DC | PRN
  Administered 2017-12-24: 2 via ORAL
  Filled 2017-12-24: qty 2

## 2017-12-24 NOTE — Discharge Summary (Signed)
See Final Progress Note 12/24/2017.  Marcelyn BruinsJacelyn Cayleigh Paull, CNM 12/24/2017  10:15 PM

## 2017-12-24 NOTE — OB Triage Note (Signed)
Pt came into triage with c/o of contractions. Pt states her contractions started yesterday around 2300 and have progressively gotten worst. Pt rates her pain an 8/10 and says she has been experiencing pain under right breast and started seeing floater spots this morning. Pt denies vaginal bleeding and LOF. Pt states positive fetal movement.

## 2017-12-24 NOTE — OB Triage Note (Signed)
Discharge instructions provided and reviewed.  Follow up care discussed.  Pt verbalized understanding. 

## 2017-12-24 NOTE — Final Progress Note (Addendum)
Physician Final Progress Note  Patient ID: Mariah Richardson MRN: [id number] DOB/AGE: 01/04/1986 32 y.o.  Admit date: 12/24/2017 Admitting provider: Nadara Mustard, MD Discharge date: 12/24/2017   Admission Diagnoses: Indication for care in labor and delivery, antepartum  Discharge Diagnoses:  Active Problems:   * No active hospital problems. *  History of Present Illness: The patient is a 32 y.o. female G3P2002 at [redacted]w[redacted]d who presents for headache, spots in her vision, and contractions. She is seen at [facility] for prenatal care. Records review shows that she has chronic hypertension, daily headaches and has complained of similar visual changes for her past several prenatal care visits. Rates headache as 10/10 now. She has not tried anything for relief. Also complains of frequent painful contractions (8/10) since 2300 last night. Denies vaginal bleeding, loss of fluid, and decreased fetal movement.  Review of systems negative unless otherwise noted in HPI.  Past Medical History:  Diagnosis Date  . Gastritis     Past Surgical History:  Procedure Laterality Date  . APPENDECTOMY    . CHOLECYSTECTOMY      No current facility-administered medications on file prior to encounter.    Current Outpatient Medications on File Prior to Encounter  Medication Sig Dispense Refill  . acetaminophen (TYLENOL) 500 MG tablet Take 500 mg by mouth every 6 (six) hours as needed.    Marland Kitchen aspirin EC 81 MG tablet Take 81 mg by mouth daily.    . Prenatal Vit-Fe Fumarate-FA (MULTIVITAMIN-PRENATAL) 27-0.8 MG TABS tablet Take 1 tablet by mouth daily at 12 noon.    . diphenhydrAMINE (BENADRYL) 25 mg capsule Take 25 mg by mouth every 6 (six) hours as needed.      No Known Allergies  Social History   Socioeconomic History  . Marital status: Single    Spouse name: Not on file  . Number of children: Not on file  . Years of education: Not on file  . Highest education level: Not on file   Occupational History  . Not on file  Social Needs  . Financial resource strain: Not on file  . Food insecurity:    Worry: Not on file    Inability: Not on file  . Transportation needs:    Medical: Not on file    Non-medical: Not on file  Tobacco Use  . Smoking status: Never Smoker  . Smokeless tobacco: Never Used  Substance and Sexual Activity  . Alcohol use: No  . Drug use: No  . Sexual activity: Yes    Birth control/protection: None  Lifestyle  . Physical activity:    Days per week: Not on file    Minutes per session: Not on file  . Stress: Not on file  Relationships  . Social connections:    Talks on phone: Not on file    Gets together: Not on file    Attends religious service: Not on file    Active member of club or organization: Not on file    Attends meetings of clubs or organizations: Not on file    Relationship status: Not on file  . Intimate partner violence:    Fear of current or ex partner: Not on file    Emotionally abused: Not on file    Physically abused: Not on file    Forced sexual activity: Not on file  Other Topics Concern  . Not on file  Social History Narrative  . Not on file    Physical Exam: BP 121/71  Pulse (!) 101   Temp 98.3 F (36.8 C) (Oral)   Resp 18   Ht 5\' 2"  (1.575 m)   Wt 218 lb (98.9 kg)   BMI 39.87 kg/m   Gen: NAD CV: Tachycardia Pulm: No increased work of breathing Pelvic: 1.5/60 on serial exams one hour apart by RN Ext: No signs of DVT  NST Baseline: 140 Variability: moderate Accelerations: present Decelerations: absent Tocometry: uterine irritability The patient was monitored for greater than 40 minutes, fetal heart rate tracing was deemed reactive, category I tracing.  Consults: None  Significant Findings/ Diagnostic Studies: labs: H&H, platelets, and liver enzymes within normal limits. PC ratio 0.2.  Discharge Condition: stable  Disposition: Discharge disposition: 01-Home or Self Care  Diet: Regular  diet  Discharge Activity: Activity as tolerated  Discharge Instructions    Discharge activity:  No Restrictions   Complete by:  As directed    Discharge diet:  No restrictions   Complete by:  As directed    Fetal Kick Count:  Lie on our left side for one hour after a meal, and count the number of times your baby kicks.  If it is less than 5 times, get up, move around and drink some juice.  Repeat the test 30 minutes later.  If it is still less than 5 kicks in an hour, notify your doctor.   Complete by:  As directed    LABOR:  When conractions begin, you should start to time them from the beginning of one contraction to the beginning  of the next.  When contractions are 5 - 10 minutes apart or less and have been regular for at least an hour, you should call your health care provider.   Complete by:  As directed    No sexual activity restrictions   Complete by:  As directed    Notify physician for bleeding from the vagina   Complete by:  As directed    Notify physician for blurring of vision or spots before the eyes   Complete by:  As directed    Notify physician for chills or fever   Complete by:  As directed    Notify physician for fainting spells, "black outs" or loss of consciousness   Complete by:  As directed    Notify physician for increase in vaginal discharge   Complete by:  As directed    Notify physician for leaking of fluid   Complete by:  As directed    Notify physician for pain or burning when urinating   Complete by:  As directed    Notify physician for pelvic pressure (sudden increase)   Complete by:  As directed    Notify physician for severe or continued nausea or vomiting   Complete by:  As directed    Notify physician for sudden gushing of fluid from the vagina (with or without continued leaking)   Complete by:  As directed    Notify physician for sudden, constant, or occasional abdominal pain   Complete by:  As directed    Notify physician if baby moving less  than usual   Complete by:  As directed      Allergies as of 12/24/2017   No Known Allergies     Medication List    TAKE these medications   acetaminophen 500 MG tablet Commonly known as:  TYLENOL Take 500 mg by mouth every 6 (six) hours as needed.   aspirin EC 81 MG tablet Take 81 mg by  mouth daily.   diphenhydrAMINE 25 mg capsule Commonly known as:  BENADRYL Take 25 mg by mouth every 6 (six) hours as needed.   multivitamin-prenatal 27-0.8 MG Tabs tablet Take 1 tablet by mouth daily at 12 noon.      Headache was not relieved with medication; patient states that she takes Fioricet at home and usually does not get good relief. Labs are reassuring and no abnormal blood pressure readings over a 2 hour period. No cervical change. Advised rest and hydration. Patient discharged home in stable condition.  Signed: Oswaldo Conroy, CNM  12/24/2017, 9:52 PM

## 2018-01-04 ENCOUNTER — Inpatient Hospital Stay
Admission: EM | Admit: 2018-01-04 | Discharge: 2018-01-06 | DRG: 807 | Disposition: A | Discharge status: Home / Self Care | Payer: Medicaid Other | Attending: Certified Nurse Midwife | Admitting: Certified Nurse Midwife

## 2018-01-04 ENCOUNTER — Encounter: Payer: Self-pay | Admitting: *Deleted

## 2018-01-04 DIAGNOSIS — F1290 Cannabis use, unspecified, uncomplicated: Secondary | ICD-10-CM | POA: Diagnosis present

## 2018-01-04 DIAGNOSIS — O114 Pre-existing hypertension with pre-eclampsia, complicating childbirth: Secondary | ICD-10-CM | POA: Diagnosis present

## 2018-01-04 DIAGNOSIS — Z7982 Long term (current) use of aspirin: Secondary | ICD-10-CM | POA: Diagnosis not present

## 2018-01-04 DIAGNOSIS — O99324 Drug use complicating childbirth: Secondary | ICD-10-CM | POA: Diagnosis present

## 2018-01-04 DIAGNOSIS — E669 Obesity, unspecified: Secondary | ICD-10-CM | POA: Diagnosis present

## 2018-01-04 DIAGNOSIS — O1002 Pre-existing essential hypertension complicating childbirth: Secondary | ICD-10-CM | POA: Diagnosis present

## 2018-01-04 DIAGNOSIS — Z3A37 37 weeks gestation of pregnancy: Secondary | ICD-10-CM

## 2018-01-04 DIAGNOSIS — O99214 Obesity complicating childbirth: Secondary | ICD-10-CM | POA: Diagnosis present

## 2018-01-04 DIAGNOSIS — O1494 Unspecified pre-eclampsia, complicating childbirth: Secondary | ICD-10-CM | POA: Diagnosis present

## 2018-01-04 LAB — COMPREHENSIVE METABOLIC PANEL
ALT: 17 U/L (ref 14–54)
AST: 20 U/L (ref 15–41)
Albumin: 2.7 g/dL — ABNORMAL LOW (ref 3.5–5.0)
Alkaline Phosphatase: 87 U/L (ref 38–126)
Anion gap: 9 (ref 5–15)
BUN: 8 mg/dL (ref 6–20)
CO2: 24 mmol/L (ref 22–32)
Calcium: 8.5 mg/dL — ABNORMAL LOW (ref 8.9–10.3)
Chloride: 103 mmol/L (ref 101–111)
Creatinine, Ser: 0.45 mg/dL (ref 0.44–1.00)
GFR calc Af Amer: >60 mL/min (ref >60)
GFR calc non Af Amer: >60 mL/min (ref >60)
Glucose, Bld: 98 mg/dL (ref 65–99)
Potassium: 3.7 mmol/L (ref 3.5–5.1)
Sodium: 136 mmol/L (ref 135–145)
Total Bilirubin: 0.2 mg/dL — ABNORMAL LOW (ref 0.3–1.2)
Total Protein: 6.5 g/dL (ref 6.5–8.1)

## 2018-01-04 LAB — CBC
HCT: 33.9 % — ABNORMAL LOW (ref 35.0–47.0)
Hemoglobin: 11.7 g/dL — ABNORMAL LOW (ref 12.0–16.0)
MCH: 27.2 pg (ref 26.0–34.0)
MCHC: 34.5 g/dL (ref 32.0–36.0)
MCV: 78.9 fL — ABNORMAL LOW (ref 80.0–100.0)
Platelets: 246 10*3/uL (ref 150–440)
RBC: 4.29 MIL/uL (ref 3.80–5.20)
RDW: 15.7 % — ABNORMAL HIGH (ref 11.5–14.5)
WBC: 10 10*3/uL (ref 3.6–11.0)

## 2018-01-04 LAB — PROTEIN / CREATININE RATIO, URINE
Creatinine, Urine: 38 mg/dL
Protein Creatinine Ratio: 1.05 mg/mg{Cre} — ABNORMAL HIGH (ref 0.00–0.15)
Total Protein, Urine: 40 mg/dL

## 2018-01-04 MED ORDER — OXYTOCIN 10 UNIT/ML IJ SOLN
INTRAMUSCULAR | Status: AC
  Filled 2018-01-04: qty 2

## 2018-01-04 MED ORDER — AMMONIA AROMATIC IN INHA
RESPIRATORY_TRACT | Status: AC
  Filled 2018-01-04: qty 10

## 2018-01-04 MED ORDER — TERBUTALINE SULFATE 1 MG/ML IJ SOLN: 0.2500 mg | Freq: Once | INTRAMUSCULAR | Status: DC | PRN

## 2018-01-04 MED ORDER — LACTATED RINGERS IV SOLN
INTRAVENOUS | Status: DC
  Administered 2018-01-04 – 2018-01-05 (×2): via INTRAVENOUS

## 2018-01-04 MED ORDER — OXYTOCIN 40 UNITS IN LACTATED RINGERS INFUSION - SIMPLE MED
1.0000 m[IU]/min | INTRAVENOUS | Status: DC
  Administered 2018-01-05: 2 m[IU]/min via INTRAVENOUS
  Filled 2018-01-04: qty 1000

## 2018-01-04 MED ORDER — LIDOCAINE HCL (PF) 1 % IJ SOLN
INTRAMUSCULAR | Status: AC
  Filled 2018-01-04: qty 30

## 2018-01-04 MED ORDER — OXYTOCIN 40 UNITS IN LACTATED RINGERS INFUSION - SIMPLE MED: 2.5000 [IU]/h | INTRAVENOUS | Status: DC

## 2018-01-04 MED ORDER — SODIUM CHLORIDE 0.9 % IV SOLN
5.0000 10*6.[IU] | Freq: Once | INTRAVENOUS | Status: AC
  Administered 2018-01-04: 5 10*6.[IU] via INTRAVENOUS
  Filled 2018-01-04: qty 5

## 2018-01-04 MED ORDER — MISOPROSTOL 200 MCG PO TABS
ORAL_TABLET | ORAL | Status: AC
  Filled 2018-01-04: qty 4

## 2018-01-04 MED ORDER — ACETAMINOPHEN 325 MG PO TABS: 650.0000 mg | ORAL_TABLET | ORAL | Status: DC | PRN

## 2018-01-04 MED ORDER — LIDOCAINE HCL (PF) 1 % IJ SOLN
30.0000 mL | INTRAMUSCULAR | Status: AC | PRN
  Administered 2018-01-05: 4 mL via SUBCUTANEOUS

## 2018-01-04 MED ORDER — PENICILLIN G POT IN DEXTROSE 60000 UNIT/ML IV SOLN
3.0000 10*6.[IU] | INTRAVENOUS | Status: DC
  Administered 2018-01-05: 3 10*6.[IU] via INTRAVENOUS
  Filled 2018-01-04 (×7): qty 50

## 2018-01-04 MED ORDER — ONDANSETRON HCL 4 MG/2ML IJ SOLN: 4.0000 mg | Freq: Four times a day (QID) | INTRAMUSCULAR | Status: DC | PRN

## 2018-01-04 MED ORDER — OXYTOCIN BOLUS FROM INFUSION
500.0000 mL | Freq: Once | INTRAVENOUS | Status: AC
  Administered 2018-01-05: 500 mL via INTRAVENOUS

## 2018-01-04 MED ORDER — LACTATED RINGERS IV SOLN: 500.0000 mL | INTRAVENOUS | Status: DC | PRN

## 2018-01-04 MED ORDER — BUTORPHANOL TARTRATE 1 MG/ML IJ SOLN: 1.0000 mg | INTRAMUSCULAR | Status: DC | PRN

## 2018-01-04 NOTE — H&P (Signed)
OB History & Physical   History of Present Illness:  Chief Complaint: contractions, vaginal bleeding  HPI:  Mariah Richardson is a 32 y.o. G1P2002 female at [redacted]w[redacted]d dated by LMP = 6wk u/s according to Care Everywhere [facility]. Her EDD is listed as 01/15/2018 with gestational age of [redacted]w[redacted]d on the current chart.  Her pregnancy has been complicated by history of preeclampsia, chronic hypertension not on medication, GBS bacteriuria, Marijuana use during pregnancy, obesity BMI 40.  She denies headache currently. She admits blurry vision and floaters which she has had throughout the pregnancy. She also has some right upper quadrant pain that she just noticed since she has been here.   She reports contractions.   She denies leakage of fluid.   She reports vaginal bleeding.   She reports fetal movement.   Given her elevated blood pressures tonight, and her elevated UPC ratio she now has preeclampsia without severe features. She is also having regular contractions and will be admitted for labor management/augmentation.  Maternal Medical History:   Past Medical History:  Diagnosis Date  . Gastritis     Past Surgical History:  Procedure Laterality Date  . APPENDECTOMY    . CHOLECYSTECTOMY      No Known Allergies  Prior to Admission medications   Medication Sig Start Date End Date Taking? Authorizing Provider  acetaminophen (TYLENOL) 500 MG tablet Take 500 mg by mouth every 6 (six) hours as needed.   Yes [provider]  aspirin EC 81 MG tablet Take 81 mg by mouth daily.   Yes [provider]  diphenhydrAMINE (BENADRYL) 25 mg capsule Take 25 mg by mouth every 6 (six) hours as needed.   Yes [provider]  Prenatal Vit-Fe Fumarate-FA (MULTIVITAMIN-PRENATAL) 27-0.8 MG TABS tablet Take 1 tablet by mouth daily at 12 noon.   Yes [provider]    OB History  Gravida Para Term Preterm AB Living  3 2 2     2   SAB TAB Ectopic Multiple Live Births          2    #  Outcome Date GA Lbr Len/2nd Weight Sex Delivery Anes PTL Lv  3 Current           2 Term           1 Term             Prenatal care site: [facility]  Social History: She  reports that she has never smoked. She has never used smokeless tobacco. She reports that she does not drink alcohol or use drugs.  Family History: Maternal Grandfather with Aneurysm  She does not have a family history of gynecologic cancer  Review of Systems: Negative x 10 systems reviewed except as noted in the HPI.    Physical Exam:  Vital Signs: BP (!) 135/52   Pulse (!) 103   Temp 98.2 F (36.8 C) (Oral)   Resp 18   Ht 5\' 2"  (1.575 m)   Wt 224 lb (101.6 kg)   BMI 40.97 kg/m  Constitutional: Well nourished, well developed female in no acute distress.  HEENT: normal Skin: Warm and dry.  Cardiovascular: Regular rate and rhythm.   Extremity: reflexes 2+, no edema  Respiratory: Clear to auscultation bilateral. Normal respiratory effort Abdomen: FHT present Back: no CVAT Neuro: DTRs 2+, Cranial nerves grossly intact Psych: Alert and Oriented x3. No memory deficits. Normal mood and affect.  MS: normal gait, normal bilateral lower extremity ROM/strength/stability.  Pelvic exam:  is not limited by body habitus EGBUS: within normal limits Vagina: within normal limits and with normal mucosa, blood in the vault and on glove after exam Cervix: 3 cm/70/-2   Pertinent Results:  Prenatal Labs: Blood type/Rh B positive  Antibody screen negative  Rubella Immune  Varicella Immune    RPR Reactive/TP PA Non-reactive  HBsAg negative  HIV negative  GC negative  Chlamydia negative  Genetic screening Declined  1 hour GTT 95  3 hour GTT NA  GBS Bacteriuria at NOB    Baseline FHR: 130 beats/min   Variability: moderate   Accelerations: present   Decelerations: absent Contractions: present frequency: every 2-3 Overall assessment: Category I  Temp:  [98.2 F (36.8 C)] 98.2 F (36.8 C) (06/16  2020) Pulse Rate:  [94-106] 103 (06/16 2220) Resp:  [18] 18 (06/16 2220) BP: (124-149)/(47-76) 135/52 (06/16 2220) Weight:  [224 lb (101.6 kg)] 224 lb (101.6 kg) (06/16 2027)  Results for Mariah Richardson (MRN [id number]) as of 01/04/2018 23:05  Ref. Range 01/04/2018 21:00 01/04/2018 21:20  Sodium Latest Ref Range: 135 - 145 mmol/L  136  Potassium Latest Ref Range: 3.5 - 5.1 mmol/L  3.7  Chloride Latest Ref Range: 101 - 111 mmol/L  103  CO2 Latest Ref Range: 22 - 32 mmol/L  24  Glucose Latest Ref Range: 65 - 99 mg/dL  98  BUN Latest Ref Range: 6 - 20 mg/dL  8  Creatinine Latest Ref Range: 0.44 - 1.00 mg/dL  4.09  Calcium Latest Ref Range: 8.9 - 10.3 mg/dL  8.5 (L)  Anion gap Latest Ref Range: 5 - 15   9  Alkaline Phosphatase Latest Ref Range: 38 - 126 U/L  87  Albumin Latest Ref Range: 3.5 - 5.0 g/dL  2.7 (L)  AST Latest Ref Range: 15 - 41 U/L  20  ALT Latest Ref Range: 14 - 54 U/L  17  Total Protein Latest Ref Range: 6.5 - 8.1 g/dL  6.5  Total Bilirubin Latest Ref Range: 0.3 - 1.2 mg/dL  0.2 (L)  GFR, Est Non African American Latest Ref Range: >60 mL/min  >60  GFR, Est African American Latest Ref Range: >60 mL/min  >60  WBC Latest Ref Range: 3.6 - 11.0 K/uL  10.0  RBC Latest Ref Range: 3.80 - 5.20 MIL/uL  4.29  Hemoglobin Latest Ref Range: 12.0 - 16.0 g/dL  81.1 (L)  HCT Latest Ref Range: 35.0 - 47.0 %  33.9 (L)  MCV Latest Ref Range: 80.0 - 100.0 fL  78.9 (L)  MCH Latest Ref Range: 26.0 - 34.0 pg  27.2  MCHC Latest Ref Range: 32.0 - 36.0 g/dL  91.4  RDW Latest Ref Range: 11.5 - 14.5 %  15.7 (H)  Platelets Latest Ref Range: 150 - 440 K/uL  246  Sample Expiration Unknown  01/07/2018...  Antibody Screen Unknown  PENDING  ABO/RH(D) Unknown  PENDING  Total Protein, Urine Latest Units: mg/dL 40   Protein Creatinine Ratio Latest Ref Range: 0.00 - 0.15 mg/mgCre 1.05 (H)   Creatinine, Urine Latest Units: mg/dL 38     Assessment:  Mariah Richardson is a 32 y.o. G58P2002 female at  [redacted]w[redacted]d with preeclampsia without severe features.   Plan:  1. Admit to Labor & Delivery  2. CBC, T&S, Clrs, IVF 3. GBS bacteriuria, treat in labor with Penicillin.   4. Fetal well-being: Category I 5. Augment labor with pitocin as needed 6. Epidural as desired  Tresea Mall, [facility] 01/04/2018  10:42 PM

## 2018-01-04 NOTE — OB Triage Note (Signed)
Pt arrived from home with complaints of vaginal bleeding since 1930 and contractions since 1700. Pt reports positive fetal movement and denies any other leaking of fluid. Pt reports a history of chronic HTN.

## 2018-01-05 ENCOUNTER — Inpatient Hospital Stay: Payer: Medicaid Other | Admitting: Anesthesiology

## 2018-01-05 ENCOUNTER — Other Ambulatory Visit: Payer: Self-pay

## 2018-01-05 ENCOUNTER — Encounter: Payer: Self-pay | Admitting: *Deleted

## 2018-01-05 DIAGNOSIS — O1490 Unspecified pre-eclampsia, unspecified trimester: Secondary | ICD-10-CM

## 2018-01-05 DIAGNOSIS — Z3A37 37 weeks gestation of pregnancy: Secondary | ICD-10-CM

## 2018-01-05 DIAGNOSIS — O114 Pre-existing hypertension with pre-eclampsia, complicating childbirth: Secondary | ICD-10-CM

## 2018-01-05 LAB — CBC
HCT: 34 % — ABNORMAL LOW (ref 35.0–47.0)
Hemoglobin: 11.3 g/dL — ABNORMAL LOW (ref 12.0–16.0)
MCH: 26 pg (ref 26.0–34.0)
MCHC: 33.1 g/dL (ref 32.0–36.0)
MCV: 78.7 fL — ABNORMAL LOW (ref 80.0–100.0)
Platelets: 235 10*3/uL (ref 150–440)
RBC: 4.33 MIL/uL (ref 3.80–5.20)
RDW: 15.5 % — ABNORMAL HIGH (ref 11.5–14.5)
WBC: 14.6 10*3/uL — ABNORMAL HIGH (ref 3.6–11.0)

## 2018-01-05 LAB — URINE DRUG SCREEN, QUALITATIVE (ARMC ONLY)
Amphetamines, Ur Screen: NOT DETECTED
Benzodiazepine, Ur Scrn: NOT DETECTED
Cannabinoid 50 Ng, Ur ~~LOC~~: NOT DETECTED
Cocaine Metabolite,Ur ~~LOC~~: NOT DETECTED
MDMA (Ecstasy)Ur Screen: NOT DETECTED
Methadone Scn, Ur: NOT DETECTED
Opiate, Ur Screen: NOT DETECTED
Phencyclidine (PCP) Ur S: NOT DETECTED
Tricyclic, Ur Screen: NOT DETECTED

## 2018-01-05 MED ORDER — IBUPROFEN 600 MG PO TABS
ORAL_TABLET | ORAL | Status: AC
  Administered 2018-01-05: 600 mg via ORAL
  Filled 2018-01-05: qty 1

## 2018-01-05 MED ORDER — SENNOSIDES-DOCUSATE SODIUM 8.6-50 MG PO TABS: 2.0000 | ORAL_TABLET | ORAL | Status: DC

## 2018-01-05 MED ORDER — SENNOSIDES-DOCUSATE SODIUM 8.6-50 MG PO TABS
ORAL_TABLET | ORAL | Status: AC
  Administered 2018-01-05: 20:00:00
  Filled 2018-01-05: qty 2

## 2018-01-05 MED ORDER — EPHEDRINE 5 MG/ML INJ
10.0000 mg | INTRAVENOUS | Status: DC | PRN
  Filled 2018-01-05: qty 2

## 2018-01-05 MED ORDER — DIBUCAINE 1 % RE OINT
1.0000 "application " | TOPICAL_OINTMENT | RECTAL | Status: DC | PRN
  Filled 2018-01-05: qty 28

## 2018-01-05 MED ORDER — DIPHENHYDRAMINE HCL 50 MG/ML IJ SOLN: 12.5000 mg | INTRAMUSCULAR | Status: DC | PRN

## 2018-01-05 MED ORDER — PHENYLEPHRINE 40 MCG/ML (10ML) SYRINGE FOR IV PUSH (FOR BLOOD PRESSURE SUPPORT)
80.0000 ug | PREFILLED_SYRINGE | INTRAVENOUS | Status: DC | PRN
  Filled 2018-01-05: qty 5

## 2018-01-05 MED ORDER — IBUPROFEN 600 MG PO TABS
600.0000 mg | ORAL_TABLET | Freq: Four times a day (QID) | ORAL | Status: DC
  Administered 2018-01-05: 600 mg via ORAL

## 2018-01-05 MED ORDER — LIDOCAINE-EPINEPHRINE (PF) 1.5 %-1:200000 IJ SOLN
INTRAMUSCULAR | Status: DC | PRN
  Administered 2018-01-05: 4 mL via EPIDURAL
  Administered 2018-01-05: 4 mL via PERINEURAL

## 2018-01-05 MED ORDER — OXYCODONE-ACETAMINOPHEN 5-325 MG PO TABS
1.0000 | ORAL_TABLET | Freq: Four times a day (QID) | ORAL | Status: DC | PRN
  Administered 2018-01-05 – 2018-01-06 (×2): 1 via ORAL
  Filled 2018-01-05: qty 1

## 2018-01-05 MED ORDER — BENZOCAINE-MENTHOL 20-0.5 % EX AERO
1.0000 "application " | INHALATION_SPRAY | CUTANEOUS | Status: DC | PRN
  Filled 2018-01-05: qty 56

## 2018-01-05 MED ORDER — ONDANSETRON HCL 4 MG PO TABS: 4.0000 mg | ORAL_TABLET | ORAL | Status: DC | PRN

## 2018-01-05 MED ORDER — IBUPROFEN 600 MG PO TABS
600.0000 mg | ORAL_TABLET | Freq: Four times a day (QID) | ORAL | Status: DC
  Administered 2018-01-05 – 2018-01-06 (×4): 600 mg via ORAL
  Filled 2018-01-05 (×4): qty 1

## 2018-01-05 MED ORDER — WITCH HAZEL-GLYCERIN EX PADS
1.0000 "application " | MEDICATED_PAD | CUTANEOUS | Status: DC | PRN
  Filled 2018-01-05: qty 100

## 2018-01-05 MED ORDER — ONDANSETRON HCL 4 MG/2ML IJ SOLN
4.0000 mg | INTRAMUSCULAR | Status: DC | PRN
Start: 2018-01-05 — End: 2018-01-06

## 2018-01-05 MED ORDER — FENTANYL 2.5 MCG/ML W/ROPIVACAINE 0.15% IN NS 100 ML EPIDURAL (ARMC)
12.0000 mL/h | EPIDURAL | Status: DC
  Administered 2018-01-05: 12 mL/h via EPIDURAL

## 2018-01-05 MED ORDER — FENTANYL 2.5 MCG/ML W/ROPIVACAINE 0.15% IN NS 100 ML EPIDURAL (ARMC)
EPIDURAL | Status: AC
  Filled 2018-01-05: qty 100

## 2018-01-05 MED ORDER — DIPHENHYDRAMINE HCL 25 MG PO CAPS: 25.0000 mg | ORAL_CAPSULE | Freq: Four times a day (QID) | ORAL | Status: DC | PRN

## 2018-01-05 MED ORDER — ACETAMINOPHEN 325 MG PO TABS
650.0000 mg | ORAL_TABLET | ORAL | Status: DC | PRN
  Filled 2018-01-05: qty 2

## 2018-01-05 MED ORDER — LACTATED RINGERS IV SOLN: 500.0000 mL | Freq: Once | INTRAVENOUS | Status: DC

## 2018-01-05 MED ORDER — OXYCODONE-ACETAMINOPHEN 5-325 MG PO TABS
ORAL_TABLET | ORAL | Status: AC
  Administered 2018-01-05: 17:00:00
  Filled 2018-01-05: qty 1

## 2018-01-05 MED ORDER — SIMETHICONE 80 MG PO CHEW
80.0000 mg | CHEWABLE_TABLET | ORAL | Status: DC | PRN
  Filled 2018-01-05: qty 1

## 2018-01-05 MED ORDER — PRENATAL MULTIVITAMIN CH
1.0000 | ORAL_TABLET | Freq: Every day | ORAL | Status: DC
  Administered 2018-01-05: 1 via ORAL

## 2018-01-05 MED ORDER — BUPIVACAINE HCL (PF) 0.25 % IJ SOLN
INTRAMUSCULAR | Status: DC | PRN
  Administered 2018-01-05 (×2): 4 mL via EPIDURAL

## 2018-01-05 MED ORDER — COCONUT OIL OIL
1.0000 "application " | TOPICAL_OIL | Status: DC | PRN
  Filled 2018-01-05: qty 120

## 2018-01-05 NOTE — Progress Notes (Signed)
Patient came to mother/baby unit at 10:20 am with no IV access. Patient is very hard stick- successful IV finally placed by IV team and postpartum pitocin started per [facility]. Will continue to closely monitor bleeding.

## 2018-01-05 NOTE — Lactation Note (Signed)
This note was copied from a baby's chart. Lactation Consultation Note  Patient Name: [name]'UToday's Date: 01/05/2018 Reason for consult: Initial assessment;Early term [id number] Assisted mom with positioning mom in comfortable position in cradle hold skin to skin.  After void was changed, Blesses started rooting.  At rest nipples appear to be flat.  Nipple/areola tissue is easy to compress.  Mom reports having to use nipple shield for a short interval with first baby, but then went on to breast feed for 1 1/2 years.  At first she was getting a shallow latch causing her to continue to lose suction on the breast.  Demonstrated to mom how to sandwich to get deeper latch.  Hand expressed several drops of colostrum easily from right breast.  Once Blesses achieved a deep enough latch, she began good rhythmic sucking with an occasional swallow.  Reviewed supply and demand, normal course of lactation and routine newborn feeding patterns.  Lactation name and number written on white board and encouraged to call with any questions, concerns or assistance.      Maternal Data Formula Feeding for Exclusion: No Has patient been taught Hand Expression?: Yes Does the patient have breastfeeding experience prior to this delivery?: Yes  Feeding Feeding Type: Breast Fed Length of feed: 11 min  LATCH Score Latch: Repeated attempts needed to sustain latch, nipple held in mouth throughout feeding, stimulation needed to elicit sucking reflex.  Audible Swallowing: A few with stimulation  Type of Nipple: Flat(Areola easy to compress for latch)  Comfort (Breast/Nipple): Soft / non-tender  Hold (Positioning): Assistance needed to correctly position infant at breast and maintain latch.  LATCH Score: 6  Interventions Interventions: Breast feeding basics reviewed;Adjust position;Assisted with latch;Support pillows;Skin to skin;Position options;Breast massage;Hand express;Coconut oil;Comfort gels;Breast  compression  Lactation Tools Discussed/Used WIC Program: Yes   Consult Status Consult Status: Follow-up Date: 01/05/18 Follow-up type: Call as needed    Louis MeckelWilliams, Burlin Mcnair Kay 01/05/2018, 1:03 PM

## 2018-01-05 NOTE — Anesthesia Procedure Notes (Signed)
Epidural Patient location during procedure: OB  Staffing Performed: anesthesiologist   Preanesthetic Checklist Completed: patient identified, site marked, surgical consent, pre-op evaluation, timeout performed, IV checked, risks and benefits discussed and monitors and equipment checked  Epidural Patient position: sitting Prep: Betadine Patient monitoring: heart rate, continuous pulse ox and blood pressure Approach: midline Location: L4-L5 Injection technique: LOR saline  Needle:  Needle type: Tuohy  Needle gauge: 17 G Needle length: 9 cm and 9 Needle insertion depth: 6 cm Catheter type: closed end flexible Catheter size: 19 Gauge Catheter at skin depth: 13 cm Test dose: negative and 1.5% lidocaine with Epi 1:200 K  Assessment Sensory level: T10 Events: blood not aspirated, injection not painful, no injection resistance, negative IV test and no paresthesia  Additional Notes   Patient tolerated the insertion well without complications.-SATD -IVTD. No paresthesia. Refer to [facility] nursing for VS and dosingReason for block:procedure for pain     

## 2018-01-05 NOTE — Discharge Summary (Signed)
OB Discharge Summary     Patient Name: Mariah Richardson DOB: 03-01-1986 MRN: [id number]  Date of admission: 01/04/2018 Delivering Provider: Tresea Mall, CNM  Date of Delivery: 01/05/2018  Date of discharge: 01/06/2018  Admitting diagnosis: Contractions, bleeding Intrauterine pregnancy: [redacted]w[redacted]d     Secondary diagnosis: Chronic hypertension with superimposed preeclampsia     Discharge diagnosis: Term Pregnancy Delivered                                                                                                Post partum procedures: none  Augmentation: Pitocin  Complications: None  Hospital course:  Onset of Labor With Vaginal Delivery      32 y.o. yo [name] at [redacted]w[redacted]d was admitted in Active Labor on 01/04/2018.  Patient had an uncomplicated labor course as follows:  Membrane Rupture Time/Date: 3:15 AM ,01/05/2018   Patient had delivery of viable female 4:06 AM, 01/05/2018 Details of delivery can be found in a separate delivery note  Patient had an uncomplicated postpartum course.  She is ambulating and voiding without difficulty.  She is tolerating PO intake and her pain is well controlled with PO medication.  Patient is discharged home in stable condition on 01/06/18.   Physical exam  Vitals:   01/05/18 1513 01/05/18 1922 01/05/18 2253 01/06/18 0801  BP: (!) 141/71 117/61 114/73 116/71  Pulse: 79 87 85 85  Resp: 18 20 18 18   Temp: (!) 97.5 F (36.4 C) 98.1 F (36.7 C) 98.2 F (36.8 C) 98 F (36.7 C)  TempSrc: Oral Oral Oral Oral  SpO2: 99% 98% 100% 99%  Weight:      Height:       General: alert, cooperative and no distress Lochia: appropriate Uterine Fundus: firm Incision: N/A DVT Evaluation: No evidence of DVT seen on physical exam.  Labs: Lab Results  Component Value Date   WBC 14.6 (H) 01/05/2018   HGB 11.3 (L) 01/05/2018   HCT 34.0 (L) 01/05/2018   MCV 78.7 (L) 01/05/2018   PLT 235 01/05/2018    Discharge instruction: per After Visit  Summary.  Medications:  Allergies as of 01/06/2018   No Known Allergies     Medication List    STOP taking these medications   aspirin EC 81 MG tablet     TAKE these medications   acetaminophen 500 MG tablet Commonly known as:  TYLENOL Take 500 mg by mouth every 6 (six) hours as needed.   diphenhydrAMINE 25 mg capsule Commonly known as:  BENADRYL Take 25 mg by mouth every 6 (six) hours as needed.   multivitamin-prenatal 27-0.8 MG Tabs tablet Take 1 tablet by mouth daily at 12 noon.       Diet: routine diet  Activity: Advance as tolerated. Pelvic rest for 6 weeks.   Outpatient follow up: Follow-up Information    Tresea Mall, CNM. Schedule an appointment as soon as possible for a visit in 6 week(s).   Specialty:  Obstetrics Why:  postpartum follow up appointment Contact information: [address] [place] [address] [phone]  Postpartum contraception: Undecided Rhogam Given postpartum: NA Rubella vaccine given postpartum: Rubella Immune Varicella vaccine given postpartum: Varicella Immune TDaP given antepartum or postpartum: given antepartum  Newborn Data: Live born female Blesses, 6#12.3oz Birth Weight: 3070 g  APGAR: 8, 9  Newborn Delivery   Birth date/time:  01/05/2018 04:06:00 Delivery type:  Vaginal, Spontaneous      Baby Feeding: Breast  Disposition: home with mother  SIGNED:  Oswaldo ConroyJacelyn Y Keng Jewel, CNM 01/06/2018 10:10 AM

## 2018-01-05 NOTE — Lactation Note (Signed)
This note was copied from a baby's chart. Lactation Consultation Note  Patient Name: Mariah Richardson [id number]'BToday's Date: 01/05/2018  Mom still requiring lots of assistance to get Blesses to latch deeply and maintain latch for adequate sucking.  Breasts are soft and pliable.  Once she gets deep enough latch, LC needs to keep breast sandwiched during feeding or she loses suction.  Tried to engage mom more by placing my hands on top of her hands, but if I let go then she lets go.  Explained to mom that for now it is going to require frequent stimulation of the baby, massaging of the breasts and compressing of the breast though most of the feeding or Blesses can not sustain the latch.  Mom can easily hand express colostrum.  Mom was becoming more aggressive and independent towards end of feeding.  Encouraged mom to continue to call if assistance needed.   Maternal Data    Feeding Feeding Type: Breast Fed  LATCH Score Latch: Repeated attempts needed to sustain latch, nipple held in mouth throughout feeding, stimulation needed to elicit sucking reflex.  Audible Swallowing: A few with stimulation  Type of Nipple: Flat  Comfort (Breast/Nipple): Soft / non-tender  Hold (Positioning): Assistance needed to correctly position infant at breast and maintain latch.  LATCH Score: 6  Interventions Interventions: Breast feeding basics reviewed;Assisted with latch;Breast compression;Adjust position;Support pillows;Position options  Lactation Tools Discussed/Used     Consult Status      Mariah Richardson, Mariah Richardson 01/05/2018, 11:26 PM

## 2018-01-05 NOTE — Anesthesia Preprocedure Evaluation (Signed)

## 2018-01-05 NOTE — Progress Notes (Signed)
Post Partum Day 0-12 hours postpartum Subjective: voiding, tolerating PO and complains of cramping. No headache  Objective:   Blood pressure (!) 141/71, pulse 79, temperature (!) 97.5 F (36.4 C), temperature source Oral, resp. rate 18, height 5\' 2"  (1.575 m), weight 101.6 kg (224 lb), last menstrual period 04/17/2017, SpO2 99 %, unknown if currently breastfeeding. MOost blood pressures since delivery have been normal, occasional mild range blood pressure Physical Exam:  General: alert, cooperative and no distress Lochia: appropriate Uterine Fundus: firm/ ML/ NT/ U-2 DVT Evaluation: No evidence of DVT seen on physical exam.  Recent Labs    01/04/18 2120 01/05/18 1035  HGB 11.7* 11.3*  HCT 33.9* 34.0*  WBC 10.0 14.6*  PLT 246 235    Assessment/Plan: Stable s/p sVD-12 hours pp  Add percocet for break through pain CHTN with superimposed preeclampsia-most blood pressures normal-a few mild range blood pressures  Saline lock IV  Vs q4 hours     LOS: 1 day   Mariah Connersolleen Necole Richardson 01/05/2018, 4:31 PM

## 2018-01-05 NOTE — Plan of Care (Signed)
  Problem: Education: Goal: Knowledge of General Education information will improve Outcome: Progressing   Problem: Health Behavior/Discharge Planning: Goal: Ability to manage health-related needs will improve Outcome: Progressing   Problem: Clinical Measurements: Goal: Ability to maintain clinical measurements within normal limits will improve Outcome: Progressing Goal: Will remain free from infection Outcome: Progressing Goal: Diagnostic test results will improve Outcome: Progressing Goal: Respiratory complications will improve Outcome: Progressing Goal: Cardiovascular complication will be avoided Outcome: Progressing   Problem: Activity: Goal: Risk for activity intolerance will decrease Outcome: Progressing   Problem: Nutrition: Goal: Adequate nutrition will be maintained Outcome: Progressing   Problem: Coping: Goal: Level of anxiety will decrease Outcome: Progressing   Problem: Elimination: Goal: Will not experience complications related to bowel motility Outcome: Progressing Goal: Will not experience complications related to urinary retention Outcome: Progressing   Problem: Pain Managment: Goal: General experience of comfort will improve Outcome: Progressing   Problem: Safety: Goal: Ability to remain free from injury will improve Outcome: Progressing   Problem: Skin Integrity: Goal: Risk for impaired skin integrity will decrease Outcome: Progressing   Problem: Education: Goal: Knowledge of Childbirth will improve Outcome: Progressing Goal: Ability to make informed decisions regarding treatment and plan of care will improve Outcome: Progressing Goal: Ability to state and carry out methods to decrease the pain will improve Outcome: Progressing   Problem: Coping: Goal: Ability to verbalize concerns and feelings about labor and delivery will improve Outcome: Progressing   Problem: Life Cycle: Goal: Ability to make normal progression through stages of  labor will improve Outcome: Progressing Goal: Ability to effectively push during vaginal delivery will improve Outcome: Progressing   Problem: Role Relationship: Goal: Ability to demonstrate positive interaction with the child will improve Outcome: Progressing   Problem: Safety: Goal: Risk of complications during labor and delivery will decrease Outcome: Progressing   Problem: Pain Management: Goal: Relief or control of pain from uterine contractions will improve Outcome: Progressing   

## 2018-01-06 LAB — RPR: RPR Ser Ql: REACTIVE — AB

## 2018-01-06 LAB — RPR, QUANT+TP ABS (REFLEX)
Rapid Plasma Reagin, Quant: 1:1 {titer} — ABNORMAL HIGH
T Pallidum Abs: NEGATIVE

## 2018-01-06 NOTE — Progress Notes (Signed)
Patient discharged home with infant. Discharge instructions and prescriptions given and reviewed with patient. Patient verbalized understanding. Escorted out by auxillary.  

## 2018-01-06 NOTE — Anesthesia Postprocedure Evaluation (Signed)
Anesthesia Post Note  Patient: Mariah Richardson  Procedure(s) Performed: AN AD HOC LABOR EPIDURAL  Patient location during evaluation: Mother Baby Anesthesia Type: Epidural Level of consciousness: awake and alert Pain management: pain level controlled Vital Signs Assessment: post-procedure vital signs reviewed and stable Respiratory status: spontaneous breathing, nonlabored ventilation and respiratory function stable Cardiovascular status: stable Postop Assessment: no headache, no backache and epidural receding Anesthetic complications: no     Last Vitals:  Vitals:   01/05/18 2253 01/06/18 0801  BP: 114/73 116/71  Pulse: 85 85  Resp: 18 18  Temp: 36.8 C 36.7 C  SpO2: 100% 99%    Last Pain:  Vitals:   01/06/18 0801  TempSrc: Oral  PainSc:                  [name],  [name]

## 2018-01-07 LAB — TYPE AND SCREEN
ABO/RH(D): B POS
Antibody Screen: POSITIVE
Unit division: 0
Unit division: 0

## 2018-01-07 LAB — BPAM RBC
Blood Product Expiration Date: 201907052359
Blood Product Expiration Date: 201907052359
Unit Type and Rh: 7300
Unit Type and Rh: 7300

## 2018-10-07 ENCOUNTER — Emergency Department
Admission: EM | Admit: 2018-10-07 | Discharge: 2018-10-07 | Disposition: A | Discharge status: Home / Self Care | Payer: Medicaid Other | Attending: Emergency Medicine | Admitting: Emergency Medicine

## 2018-10-07 ENCOUNTER — Other Ambulatory Visit: Payer: Self-pay

## 2018-10-07 ENCOUNTER — Encounter: Payer: Self-pay | Admitting: *Deleted

## 2018-10-07 DIAGNOSIS — R05 Cough: Secondary | ICD-10-CM | POA: Diagnosis present

## 2018-10-07 DIAGNOSIS — J069 Acute upper respiratory infection, unspecified: Secondary | ICD-10-CM | POA: Insufficient documentation

## 2018-10-07 LAB — INFLUENZA PANEL BY PCR (TYPE A & B)
Influenza A By PCR: NEGATIVE
Influenza B By PCR: NEGATIVE

## 2018-10-07 MED ORDER — FLUTICASONE PROPIONATE 50 MCG/ACT NA SUSP: 2.0000 | Freq: Every day | NASAL | 0 refills | Status: DC

## 2018-10-07 MED ORDER — ALBUTEROL SULFATE HFA 108 (90 BASE) MCG/ACT IN AERS: 2.0000 | INHALATION_SPRAY | Freq: Four times a day (QID) | RESPIRATORY_TRACT | 0 refills | Status: DC | PRN

## 2018-10-07 MED ORDER — BENZONATATE 100 MG PO CAPS: ORAL_CAPSULE | ORAL | 0 refills | Status: DC

## 2018-10-07 NOTE — ED Triage Notes (Addendum)
First RN Note: Pt presents to ED via POV with c/o runny nose, fever, cough, and HA since yesterday. Pt c/o cough x 1 week. Pt provided with mask by this RN.

## 2018-10-07 NOTE — ED Notes (Signed)
Pt with headache, body aches, runny nose, and occasional nausea for 3 days. No vomiting, diarrhea. PT ambulatory to room 54 with no distress or SOB.

## 2018-10-07 NOTE — ED Triage Notes (Signed)
Pt has bodyaches, h/a, sore throat and cough  for 2 days.  Non smoker.    Pt alert

## 2018-10-07 NOTE — Discharge Instructions (Signed)
Your symptoms are consistent with a viral URI. Your flu tests were negative. Take the prescription meds as directed. Take OTC Delsym for cough; allergy medicine for runny nose; pseudoephedrine for sinus and ear pressure; and Tylenol & Motrin for fevers. Follow-up with your provider or [facility] for continued symptoms.

## 2018-10-07 NOTE — ED Provider Notes (Signed)
[facility] Emergency Department Provider Note ____________________________________________  Time seen: 1725  I have reviewed the triage vital signs and the nursing notes.  HISTORY  Chief Complaint  Influenza  HPI Mariah Richardson is a 33 y.o. female since her self to the ED for evaluation of a one-week complaint of nonproductive cough, sore throat, and body aches.  She notes however, a 1 day complaint of runny nose, subjective fevers, and headache.  Has been taking Tylenol and Motrin for her symptom relief.  She denies any other medications to manage her acute symptoms.  She denies any sick contacts, recent travel, high risk exposures, or other concerns.  She did receive the seasonal flu vaccine.  She has had normal appetite, and denies any nausea, vomiting, or diarrhea.  Past Medical History:  Diagnosis Date  . Gastritis     Patient Active Problem List   Diagnosis Date Noted  . Postpartum care following vaginal delivery 01/05/2018  . Labor and delivery, indication for care 01/04/2018    Past Surgical History:  Procedure Laterality Date  . APPENDECTOMY    . CHOLECYSTECTOMY      Prior to Admission medications   Medication Sig Start Date End Date Taking? Authorizing Provider  acetaminophen (TYLENOL) 500 MG tablet Take 500 mg by mouth every 6 (six) hours as needed.    [provider]  albuterol (PROVENTIL HFA;VENTOLIN HFA) 108 (90 Base) MCG/ACT inhaler Inhale 2 puffs into the lungs every 6 (six) hours as needed for wheezing or shortness of breath. 10/07/18   Ned Kakar, Charlesetta Ivory, PA-C  benzonatate (TESSALON PERLES) 100 MG capsule Take 1-2 tabs TID prn cough 10/07/18   Christyann Manolis, Charlesetta Ivory, PA-C  diphenhydrAMINE (BENADRYL) 25 mg capsule Take 25 mg by mouth every 6 (six) hours as needed.    [provider]  fluticasone (FLONASE) 50 MCG/ACT nasal spray Place 2 sprays into both nostrils daily. 10/07/18   Geriann Lafont, Charlesetta Ivory, PA-C   Prenatal Vit-Fe Fumarate-FA (MULTIVITAMIN-PRENATAL) 27-0.8 MG TABS tablet Take 1 tablet by mouth daily at 12 noon.    [provider]    Allergies Patient has no known allergies.  No family history on file.  Social History Social History   Tobacco Use  . Smoking status: Never Smoker  . Smokeless tobacco: Never Used  Substance Use Topics  . Alcohol use: No  . Drug use: No    Review of Systems  Constitutional: Positive for objective fever. Eyes: Negative for visual changes. ENT: Positive for sore throat and runny nose. Cardiovascular: Negative for chest pain. Respiratory: Negative for shortness of breath.  Reports nonproductive cough Gastrointestinal: Negative for abdominal pain, vomiting and diarrhea. Genitourinary: Negative for dysuria. Musculoskeletal: Negative for back pain. Skin: Negative for rash. Neurological: Negative for headaches, focal weakness or numbness. ____________________________________________  PHYSICAL EXAM:  VITAL SIGNS: ED Triage Vitals  Enc Vitals Group     BP 10/07/18 1730 122/66     Pulse Rate 10/07/18 1730 95     Resp 10/07/18 1730 15     Temp 10/07/18 1730 98.3 F (36.8 C)     Temp Source 10/07/18 1730 Oral     SpO2 10/07/18 1730 100 %     Weight 10/07/18 1714 210 lb (95.3 kg)     Height 10/07/18 1714 5\' 2"  (1.575 m)     Head Circumference --      Peak Flow --      Pain Score 10/07/18 1713 0  Pain Loc --      Pain Edu? --      Excl. in [place]? --     Constitutional: Alert and oriented. Well appearing and in no distress. Head: Normocephalic and atraumatic. Eyes: Conjunctivae are normal. PERRL. Normal extraocular movements Ears: Canals clear. TMs intact bilaterally.  Mild serous effusion noted on the right. Nose: No congestion/rhinorrhea/epistaxis. Mouth/Throat: Mucous membranes are moist.  Uvula is midline and tonsils are flat.  No oropharyngeal lesions appreciated. Neck: Supple. No  thyromegaly. Hematological/Lymphatic/Immunological: No cervical lymphadenopathy. Cardiovascular: Normal rate, regular rhythm. Normal distal pulses. Respiratory: Normal respiratory effort. No wheezes/rales/rhonchi. Musculoskeletal: Nontender with normal range of motion in all extremities.  Neurologic:  Normal gait without ataxia. Normal speech and language. No gross focal neurologic deficits are appreciated. Skin:  Skin is warm, dry and intact. No rash noted. Psychiatric: Mood and affect are normal. Patient exhibits appropriate insight and judgment. ____________________________________________   LABS (pertinent positives/negatives) Labs Reviewed  INFLUENZA PANEL BY PCR (TYPE A & B)  ____________________________________________  PROCEDURES  Procedures ____________________________________________  INITIAL IMPRESSION / ASSESSMENT AND PLAN / ED COURSE  Patient with ED evaluation of a one-week complaint of nonproductive cough, and a 1 day complaint of subjective fevers, headache, and runny nose.  Patient has been without any signs of acute respiratory distress, denies any other complaints.  Her influenza screen is negative at this time.  Her exam is overall benign as she shows no acute respiratory distress, or signs of dehydration.  She is discharged at this time with additional diagnosis of a viral URI.  She will take over-the-counter Tylenol, Motrin, allergy medicine, and decongestant.  She will be discharged with prescriptions for albuterol, Tessalon Perles, and Flonase.  Return precautions have been reviewed.  A work note provided for 1 day as requested. ____________________________________________  FINAL CLINICAL IMPRESSION(S) / ED DIAGNOSES  Final diagnoses:  Viral upper respiratory tract infection      Lissa Hoard, PA-C 10/07/18 1839    Don Perking, [place], MD 10/11/18 1133

## 2019-02-02 DIAGNOSIS — Z713 Dietary counseling and surveillance: Secondary | ICD-10-CM | POA: Insufficient documentation

## 2019-02-26 ENCOUNTER — Other Ambulatory Visit: Payer: Self-pay

## 2019-02-26 ENCOUNTER — Encounter: Payer: Self-pay | Admitting: Emergency Medicine

## 2019-02-26 ENCOUNTER — Emergency Department
Admission: EM | Admit: 2019-02-26 | Discharge: 2019-02-26 | Disposition: A | Discharge status: Home / Self Care | Payer: Medicaid Other | Attending: Student in an Organized Health Care Education/Training Program | Admitting: Student in an Organized Health Care Education/Training Program

## 2019-02-26 DIAGNOSIS — H10023 Other mucopurulent conjunctivitis, bilateral: Secondary | ICD-10-CM

## 2019-02-26 DIAGNOSIS — H5789 Other specified disorders of eye and adnexa: Secondary | ICD-10-CM | POA: Diagnosis present

## 2019-02-26 DIAGNOSIS — Z79899 Other long term (current) drug therapy: Secondary | ICD-10-CM | POA: Diagnosis not present

## 2019-02-26 MED ORDER — SULFACETAMIDE SODIUM 10 % OP SOLN: 2.0000 [drp] | OPHTHALMIC | 0 refills | Status: AC

## 2019-02-26 MED ORDER — HYDROXYZINE HCL 50 MG PO TABS: 50.0000 mg | ORAL_TABLET | Freq: Three times a day (TID) | ORAL | 0 refills | Status: DC | PRN

## 2019-02-26 NOTE — ED Triage Notes (Signed)
Pt to ED via POV c/o bilateral eye burning, itching, and swelling x 3 days. Pt states that she has also had some yellow discharge from her eyes. Pt is in NAD at this time.

## 2019-02-26 NOTE — ED Provider Notes (Signed)
[facility] Emergency Department Provider Note   ____________________________________________   First MD Initiated Contact with Patient 02/26/19 1149     (approximate)  I have reviewed the triage vital signs and the nursing notes.   HISTORY  Chief Complaint Eye Pain    HPI Mariah Richardson is a 33 y.o. female patient complained of matted eyelids and purulent drainage for 3 days.  Patient stated complaint started in the right eye had spread to the left eye.  Patient denies URI signs and symptoms.  Patient denies eye pain.  Patient denies use of contact lens.  Patient denies vision disturbance.         Past Medical History:  Diagnosis Date  . Gastritis     Patient Active Problem List   Diagnosis Date Noted  . Postpartum care following vaginal delivery 01/05/2018  . Labor and delivery, indication for care 01/04/2018    Past Surgical History:  Procedure Laterality Date  . APPENDECTOMY    . CHOLECYSTECTOMY      Prior to Admission medications   Medication Sig Start Date End Date Taking? Authorizing Provider  acetaminophen (TYLENOL) 500 MG tablet Take 500 mg by mouth every 6 (six) hours as needed.    [provider]  albuterol (PROVENTIL HFA;VENTOLIN HFA) 108 (90 Base) MCG/ACT inhaler Inhale 2 puffs into the lungs every 6 (six) hours as needed for wheezing or shortness of breath. 10/07/18   Menshew, Charlesetta IvoryJenise V Bacon, PA-C  benzonatate (TESSALON PERLES) 100 MG capsule Take 1-2 tabs TID prn cough 10/07/18   Menshew, Charlesetta IvoryJenise V Bacon, PA-C  diphenhydrAMINE (BENADRYL) 25 mg capsule Take 25 mg by mouth every 6 (six) hours as needed.    [provider]  fluticasone (FLONASE) 50 MCG/ACT nasal spray Place 2 sprays into both nostrils daily. 10/07/18   Menshew, Charlesetta IvoryJenise V Bacon, PA-C  hydrOXYzine (ATARAX/VISTARIL) 50 MG tablet Take 1 tablet (50 mg total) by mouth 3 (three) times daily as needed for itching. 02/26/19   Joni ReiningSmith, Candelaria Pies K, PA-C  Prenatal  Vit-Fe Fumarate-FA (MULTIVITAMIN-PRENATAL) 27-0.8 MG TABS tablet Take 1 tablet by mouth daily at 12 noon.    [provider]  sulfacetamide (BLEPH-10) 10 % ophthalmic solution Place 2 drops into both eyes every 4 (four) hours for 10 days. 02/26/19 03/08/19  Joni ReiningSmith, Kawan Valladolid K, PA-C    Allergies Patient has no known allergies.  No family history on file.  Social History Social History   Tobacco Use  . Smoking status: Never Smoker  . Smokeless tobacco: Never Used  Substance Use Topics  . Alcohol use: No  . Drug use: No    Review of Systems Constitutional: No fever/chills Eyes: No visual changes.  Matted eyelids and purulent drainage. ENT: No sore throat. Cardiovascular: Denies chest pain. Respiratory: Denies shortness of breath. Gastrointestinal: No abdominal pain.  No nausea, no vomiting.  No diarrhea.  No constipation. Genitourinary: Negative for dysuria. Musculoskeletal: Negative for back pain. Skin: Negative for rash. Neurological: Negative for headaches, focal weakness or numbness.   ____________________________________________   PHYSICAL EXAM:  VITAL SIGNS: ED Triage Vitals  Enc Vitals Group     BP 02/26/19 1140 116/61     Pulse Rate 02/26/19 1140 95     Resp 02/26/19 1140 16     Temp 02/26/19 1140 98.8 F (37.1 C)     Temp Source 02/26/19 1140 Oral     SpO2 02/26/19 1140 99 %     Weight 02/26/19 1141 210 lb (95.3 kg)  Height 02/26/19 1141 5\' 2"  (1.575 m)     Head Circumference --      Peak Flow --      Pain Score 02/26/19 1141 0     Pain Loc --      Pain Edu? --      Excl. in [place]? --     Constitutional: Alert and oriented. Well appearing and in no acute distress. Eyes: Conjunctivae are erythematous.  PERRL. EOMI. dry secretions lateral eyelids. Head: Atraumatic. Nose: No congestion/rhinnorhea. Cardiovascular: Normal rate, regular rhythm. Grossly normal heart sounds.  Good peripheral circulation. Respiratory: Normal respiratory effort.  No  retractions. Lungs CTAB. Musculoskeletal: No lower extremity tenderness nor edema.  No joint effusions. Neurologic:  Normal speech and language. No gross focal neurologic deficits are appreciated. No gait instability. Skin:  Skin is warm, dry and intact. No rash noted. Psychiatric: Mood and affect are normal. Speech and behavior are normal.  ____________________________________________   LABS (all labs ordered are listed, but only abnormal results are displayed)  Labs Reviewed - No data to display ____________________________________________  EKG   ____________________________________________  RADIOLOGY  ED MD interpretation:    Official radiology report(s): No results found.  ____________________________________________   PROCEDURES  Procedure(s) performed (including Critical Care):  Procedures   ____________________________________________   INITIAL IMPRESSION / ASSESSMENT AND PLAN / ED COURSE  As part of my medical decision making, I reviewed the following data within the [place] was evaluated in Emergency Department on 02/26/2019 for the symptoms described in the history of present illness. She was evaluated in the context of the global COVID-19 pandemic, which necessitated consideration that the patient might be at risk for infection with the SARS-CoV-2 virus that causes COVID-19. Institutional protocols and algorithms that pertain to the evaluation of patients at risk for COVID-19 are in a state of rapid change based on information released by regulatory bodies including the CDC and federal and state organizations. These policies and algorithms were followed during the patient's care in the ED.    Patient presents with purulent drainage bilateral eyes and matted eyelids.  Patient is exam consistent with bacterial conjunctivitis.  Patient given discharge care instructions and a work note.  Patient advised follow-up  ophthalmology if no improvement in 3 to 5 days.     ____________________________________________   FINAL CLINICAL IMPRESSION(S) / ED DIAGNOSES  Final diagnoses:  Other mucopurulent conjunctivitis of both eyes     ED Discharge Orders         Ordered    sulfacetamide (BLEPH-10) 10 % ophthalmic solution  Every 4 hours     02/26/19 1218    hydrOXYzine (ATARAX/VISTARIL) 50 MG tablet  3 times daily PRN     02/26/19 1218           Note:  This document was prepared using Dragon voice recognition software and may include unintentional dictation errors.    Sable Feil, PA-C 02/26/19 1227    Carrie Mew, MD 03/08/19 1539

## 2019-07-30 DIAGNOSIS — IMO0002 Reserved for concepts with insufficient information to code with codable children: Secondary | ICD-10-CM | POA: Insufficient documentation

## 2019-10-22 ENCOUNTER — Other Ambulatory Visit: Payer: Self-pay

## 2019-10-22 ENCOUNTER — Encounter: Payer: Self-pay | Admitting: Emergency Medicine

## 2019-10-22 ENCOUNTER — Emergency Department: Payer: Medicaid Other

## 2019-10-22 ENCOUNTER — Emergency Department
Admission: EM | Admit: 2019-10-22 | Discharge: 2019-10-22 | Disposition: A | Discharge status: Home / Self Care | Payer: Medicaid Other | Attending: Emergency Medicine | Admitting: Emergency Medicine

## 2019-10-22 DIAGNOSIS — Z79899 Other long term (current) drug therapy: Secondary | ICD-10-CM | POA: Insufficient documentation

## 2019-10-22 DIAGNOSIS — M79644 Pain in right finger(s): Secondary | ICD-10-CM | POA: Insufficient documentation

## 2019-10-22 DIAGNOSIS — W208XXA Other cause of strike by thrown, projected or falling object, initial encounter: Secondary | ICD-10-CM | POA: Insufficient documentation

## 2019-10-22 DIAGNOSIS — Z9049 Acquired absence of other specified parts of digestive tract: Secondary | ICD-10-CM | POA: Insufficient documentation

## 2019-10-22 MED ORDER — MELOXICAM 15 MG PO TABS: 15.0000 mg | ORAL_TABLET | Freq: Every day | ORAL | 1 refills | Status: AC

## 2019-10-22 NOTE — ED Notes (Signed)
See triage note  Presents with pain to right thumb  States she was moving a box in her closet and it fell onto her thumb

## 2019-10-22 NOTE — ED Triage Notes (Signed)
Pt to ER with c/o right thumb pain after getting hit with a box last week.  Pt arrives with finger in fold over splint.

## 2019-10-22 NOTE — ED Provider Notes (Signed)
Emergency Department Provider Note  ____________________________________________  Time seen: Approximately 8:06 PM  I have reviewed the triage vital signs and the nursing notes.   HISTORY  Chief Complaint Finger Injury   Historian Patient   HPI Mariah Richardson is a 34 y.o. female presents to the emergency department with pain along the dorsal aspect of the right thumb.  Patient states that she was moving objects in a closet when a box fell on her right thumb.  She states that she has had difficulty extending thumb since injury occurred.  Patient has splinted right thumb into extension.  No numbness or tingling in the right hand.  No abrasions or lacerations.  No similar injuries in the past.   Past Medical History:  Diagnosis Date  . Gastritis      Immunizations up to date:  Yes.     Past Medical History:  Diagnosis Date  . Gastritis     Patient Active Problem List   Diagnosis Date Noted  . Postpartum care following vaginal delivery 01/05/2018  . Labor and delivery, indication for care 01/04/2018    Past Surgical History:  Procedure Laterality Date  . APPENDECTOMY    . CHOLECYSTECTOMY      Prior to Admission medications   Medication Sig Start Date End Date Taking? Authorizing Provider  acetaminophen (TYLENOL) 500 MG tablet Take 500 mg by mouth every 6 (six) hours as needed.    [provider]  albuterol (PROVENTIL HFA;VENTOLIN HFA) 108 (90 Base) MCG/ACT inhaler Inhale 2 puffs into the lungs every 6 (six) hours as needed for wheezing or shortness of breath. 10/07/18   Menshew, Dannielle Karvonen, PA-C  fluticasone (FLONASE) 50 MCG/ACT nasal spray Place 2 sprays into both nostrils daily. 10/07/18   Menshew, Dannielle Karvonen, PA-C  hydrOXYzine (ATARAX/VISTARIL) 50 MG tablet Take 1 tablet (50 mg total) by mouth 3 (three) times daily as needed for itching. 02/26/19   Sable Feil, PA-C  meloxicam (MOBIC) 15 MG tablet Take 1 tablet (15 mg total) by mouth daily  for 7 days. 10/22/19 10/29/19  Lannie Fields, PA-C    Allergies Patient has no known allergies.  History reviewed. No pertinent family history.  Social History Social History   Tobacco Use  . Smoking status: Never Smoker  . Smokeless tobacco: Never Used  Substance Use Topics  . Alcohol use: No  . Drug use: No     Review of Systems  Constitutional: No fever/chills Eyes:  No discharge ENT: No upper respiratory complaints. Respiratory: no cough. No SOB/ use of accessory muscles to breath Gastrointestinal:   No nausea, no vomiting.  No diarrhea.  No constipation. Musculoskeletal: Patient has right thumb pain.  Skin: Negative for rash, abrasions, lacerations, ecchymosis.  ____________________________________________   PHYSICAL EXAM:  VITAL SIGNS: ED Triage Vitals  Enc Vitals Group     BP 10/22/19 1734 109/73     Pulse Rate 10/22/19 1734 (!) 114     Resp 10/22/19 1734 18     Temp 10/22/19 1734 98.7 F (37.1 C)     Temp src --      SpO2 10/22/19 1734 100 %     Weight 10/22/19 1735 221 lb (100.2 kg)     Height 10/22/19 1735 5\' 2"  (1.575 m)     Head Circumference --      Peak Flow --      Pain Score 10/22/19 1734 8     Pain Loc --  Pain Edu? --      Excl. in [place]? --      Constitutional: Alert and oriented. Well appearing and in no acute distress. Eyes: Conjunctivae are normal. PERRL. EOMI. Head: Atraumatic. Cardiovascular: Normal rate, regular rhythm. Normal S1 and S2.  Good peripheral circulation. Respiratory: Normal respiratory effort without tachypnea or retractions. Lungs CTAB. Good air entry to the bases with no decreased or absent breath sounds Gastrointestinal: Bowel sounds x 4 quadrants. Soft and nontender to palpation. No guarding or rigidity. No distention. Musculoskeletal: Patient has difficulty performing extension at right thumb.  She is able to perform flexion easily.  Palpable radial pulse, right.  Capillary refill of right thumb less than 2  seconds. Neurologic:  Normal for age. No gross focal neurologic deficits are appreciated.  Skin:  Skin is warm, dry and intact. No rash noted. Psychiatric: Mood and affect are normal for age. Speech and behavior are normal.   ____________________________________________   LABS (all labs ordered are listed, but only abnormal results are displayed)  Labs Reviewed - No data to display ____________________________________________  EKG   ____________________________________________  RADIOLOGY Geraldo Pitter, personally viewed and evaluated these images (plain radiographs) as part of my medical decision making, as well as reviewing the written report by the radiologist.  DG Finger Thumb Right  Result Date: 10/22/2019 CLINICAL DATA:  Right thumb pain. Moving a box in closet that fell onto thumb. EXAM: RIGHT THUMB 2+V COMPARISON:  None. FINDINGS: There is no evidence of fracture or dislocation. There is no evidence of arthropathy or other focal bone abnormality. Soft tissues are unremarkable. IMPRESSION: Negative radiographs of the right thumb. Electronically Signed   By: Narda Rutherford M.D.   On: 10/22/2019 18:15    ____________________________________________    PROCEDURES  Procedure(s) performed:     Procedures     Medications - No data to display   ____________________________________________   INITIAL IMPRESSION / ASSESSMENT AND PLAN / ED COURSE  Pertinent labs & imaging results that were available during my care of the patient were reviewed by me and considered in my medical decision making (see chart for details).      Assessment and plan:  Right thumb pain 34 year old female presents to the emergency department with pain along the dorsal aspect of the right thumb after a crush type injury.  Patient had difficulty performing extension at right thumb patient was advised to continue wearing splint at home.  Patient was discharged with meloxicam.  She was  advised to follow-up with orthopedics, Dr. Stephenie Acres.   ____________________________________________  FINAL CLINICAL IMPRESSION(S) / ED DIAGNOSES  Final diagnoses:  Pain of right thumb      NEW MEDICATIONS STARTED DURING THIS VISIT:  ED Discharge Orders         Ordered    meloxicam (MOBIC) 15 MG tablet  Daily     10/22/19 1938              This chart was dictated using voice recognition software/Dragon. Despite best efforts to proofread, errors can occur which can change the meaning. Any change was purely unintentional.     Gasper Lloyd 10/22/19 2009    Emily Filbert, MD 10/22/19 [phone]

## 2020-01-07 DIAGNOSIS — R768 Other specified abnormal immunological findings in serum: Secondary | ICD-10-CM

## 2020-01-10 ENCOUNTER — Ambulatory Visit: Payer: Medicaid Other

## 2020-01-17 ENCOUNTER — Telehealth: Payer: Self-pay

## 2020-01-17 NOTE — Telephone Encounter (Signed)
Confirmed appointment on 01/19/2020 and screened for covid. [facility] 

## 2020-01-19 ENCOUNTER — Ambulatory Visit: Payer: Medicaid Other | Admitting: Adult Health

## 2020-04-19 ENCOUNTER — Emergency Department
Admission: EM | Admit: 2020-04-19 | Discharge: 2020-04-19 | Disposition: A | Discharge status: Home / Self Care | Payer: No Typology Code available for payment source | Attending: Emergency Medicine | Admitting: Emergency Medicine

## 2020-04-19 ENCOUNTER — Emergency Department: Payer: No Typology Code available for payment source

## 2020-04-19 ENCOUNTER — Other Ambulatory Visit: Payer: Self-pay

## 2020-04-19 ENCOUNTER — Encounter: Payer: Self-pay | Admitting: Emergency Medicine

## 2020-04-19 DIAGNOSIS — T07XXXA Unspecified multiple injuries, initial encounter: Secondary | ICD-10-CM

## 2020-04-19 DIAGNOSIS — Z7951 Long term (current) use of inhaled steroids: Secondary | ICD-10-CM | POA: Diagnosis not present

## 2020-04-19 DIAGNOSIS — S39012A Strain of muscle, fascia and tendon of lower back, initial encounter: Secondary | ICD-10-CM | POA: Insufficient documentation

## 2020-04-19 DIAGNOSIS — S161XXA Strain of muscle, fascia and tendon at neck level, initial encounter: Secondary | ICD-10-CM

## 2020-04-19 DIAGNOSIS — S199XXA Unspecified injury of neck, initial encounter: Secondary | ICD-10-CM | POA: Diagnosis present

## 2020-04-19 LAB — POCT PREGNANCY, URINE: Preg Test, Ur: NEGATIVE

## 2020-04-19 MED ORDER — METHOCARBAMOL 500 MG PO TABS: 500.0000 mg | ORAL_TABLET | Freq: Four times a day (QID) | ORAL | 0 refills | Status: DC

## 2020-04-19 MED ORDER — HYDROCODONE-ACETAMINOPHEN 5-325 MG PO TABS
1.0000 | ORAL_TABLET | Freq: Once | ORAL | Status: AC
  Administered 2020-04-19: 1 via ORAL
  Filled 2020-04-19: qty 1

## 2020-04-19 MED ORDER — KETOROLAC TROMETHAMINE 30 MG/ML IJ SOLN
30.0000 mg | Freq: Once | INTRAMUSCULAR | Status: AC
  Administered 2020-04-19: 30 mg via INTRAMUSCULAR
  Filled 2020-04-19: qty 1

## 2020-04-19 MED ORDER — ORPHENADRINE CITRATE 30 MG/ML IJ SOLN
60.0000 mg | Freq: Once | INTRAMUSCULAR | Status: AC
  Administered 2020-04-19: 60 mg via INTRAMUSCULAR
  Filled 2020-04-19: qty 2

## 2020-04-19 MED ORDER — MELOXICAM 15 MG PO TABS: 15.0000 mg | ORAL_TABLET | Freq: Every day | ORAL | 0 refills | Status: DC
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
## 2020-04-19 NOTE — ED Notes (Signed)
Signature pad not working, pt verbalizes understanding of d/c instructions, denies questions or concerns at this time  

## 2020-06-09 DIAGNOSIS — N760 Acute vaginitis: Secondary | ICD-10-CM | POA: Insufficient documentation

## 2020-06-11 ENCOUNTER — Other Ambulatory Visit: Payer: Self-pay

## 2020-06-11 ENCOUNTER — Encounter: Payer: Self-pay | Admitting: Emergency Medicine

## 2020-06-11 ENCOUNTER — Emergency Department
Admission: EM | Admit: 2020-06-11 | Discharge: 2020-06-11 | Disposition: A | Discharge status: Home / Self Care | Payer: Medicaid Other | Attending: Emergency Medicine | Admitting: Emergency Medicine

## 2020-06-11 ENCOUNTER — Emergency Department: Payer: Medicaid Other

## 2020-06-11 DIAGNOSIS — R42 Dizziness and giddiness: Secondary | ICD-10-CM | POA: Insufficient documentation

## 2020-06-11 DIAGNOSIS — Z91040 Latex allergy status: Secondary | ICD-10-CM | POA: Insufficient documentation

## 2020-06-11 DIAGNOSIS — I10 Essential (primary) hypertension: Secondary | ICD-10-CM | POA: Diagnosis not present

## 2020-06-11 DIAGNOSIS — R002 Palpitations: Secondary | ICD-10-CM | POA: Diagnosis not present

## 2020-06-11 DIAGNOSIS — J0100 Acute maxillary sinusitis, unspecified: Secondary | ICD-10-CM | POA: Diagnosis not present

## 2020-06-11 DIAGNOSIS — R079 Chest pain, unspecified: Secondary | ICD-10-CM | POA: Insufficient documentation

## 2020-06-11 HISTORY — DX: Essential (primary) hypertension: I10

## 2020-06-11 LAB — CBC
HCT: 43.3 % (ref 36.0–46.0)
Hemoglobin: 14.3 g/dL (ref 12.0–15.0)
MCH: 29.8 pg (ref 26.0–34.0)
MCHC: 33 g/dL (ref 30.0–36.0)
MCV: 90.2 fL (ref 80.0–100.0)
Platelets: 330 10*3/uL (ref 150–400)
RBC: 4.8 MIL/uL (ref 3.87–5.11)
RDW: 12.2 % (ref 11.5–15.5)
WBC: 7.6 10*3/uL (ref 4.0–10.5)
nRBC: 0 % (ref 0.0–0.2)

## 2020-06-11 LAB — BASIC METABOLIC PANEL
Anion gap: 10 (ref 5–15)
BUN: 10 mg/dL (ref 6–20)
CO2: 25 mmol/L (ref 22–32)
Calcium: 9.3 mg/dL (ref 8.9–10.3)
Chloride: 101 mmol/L (ref 98–111)
Creatinine, Ser: 0.64 mg/dL (ref 0.44–1.00)
GFR, Estimated: >60 mL/min (ref >60)
Glucose, Bld: 111 mg/dL — ABNORMAL HIGH (ref 70–99)
Potassium: 4.4 mmol/L (ref 3.5–5.1)
Sodium: 136 mmol/L (ref 135–145)

## 2020-06-11 LAB — TROPONIN I (HIGH SENSITIVITY)
Troponin I (High Sensitivity): 4 ng/L (ref <18)
Troponin I (High Sensitivity): <2 ng/L (ref <18)

## 2020-06-11 MED ORDER — MECLIZINE HCL 50 MG PO TABS: 50.0000 mg | ORAL_TABLET | Freq: Three times a day (TID) | ORAL | 0 refills | Status: DC | PRN

## 2020-06-11 MED ORDER — AMOXICILLIN-POT CLAVULANATE 875-125 MG PO TABS: 1.0000 | ORAL_TABLET | Freq: Two times a day (BID) | ORAL | 0 refills | Status: DC

## 2020-06-11 MED ORDER — FLUTICASONE PROPIONATE 50 MCG/ACT NA SUSP: 1.0000 | Freq: Two times a day (BID) | NASAL | 0 refills | Status: DC

## 2020-06-11 MED ORDER — MECLIZINE HCL 25 MG PO TABS: 50.0000 mg | ORAL_TABLET | Freq: Once | ORAL | Status: DC

## 2020-06-11 NOTE — ED Triage Notes (Signed)
Pt arrived via POV with reports of dizziness, lightheadedness and rapid heart rate, sxs began about 1 week ago.  C/o chest pain also 4/10.

## 2020-06-11 NOTE — ED Notes (Signed)
First Nurse Note: Pt to ED for multiple medical complaints x 1 week. Pt reports lightheadedness, dizziness, chest pain, and palpitations. Pt declines wheelchair at this time.

## 2020-06-11 NOTE — ED Notes (Signed)
Patient transported to X-ray 

## 2020-06-11 NOTE — ED Provider Notes (Signed)
[facility] Emergency Department Provider Note  ____________________________________________  Time seen: Approximately 12:08 PM  I have reviewed the triage vital signs and the nursing notes.   HISTORY  Chief Complaint Dizziness and Chest Pain    HPI Mariah Richardson is a 34 y.o. female who presents the emergency department complaining of primarily sinus pressure, dizziness over the past week.  Patient states that she has had some increased allergy symptoms, sinus pressure.  She is also noticed that she has had spells where she feels like everything is "spinning."  Patient denies any ear pain.  No sore throat.  No cough.  No fevers or chills.  Patient does not take any medication for these complaints.  Patient also is complaining of palpitations.  She states that she feels as if her heart is "racing."  She has had 2 brief periods where she has had some nonspecific chest pain.  She is unable to localize it.  This is not alleviated or exasperated by movement, breathing, palpation.  No history of DVT or PE.  No cardiac history.  Patient has not take any medications for this complaint either.  Currently she denies any chest pain but states that she is having "fluttering."  No abdominal complaints.         Past Medical History:  Diagnosis Date  . Gastritis   . Hypertension     Patient Active Problem List   Diagnosis Date Noted  . 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 Glaucoma 07/30/2016    Past Surgical History:  Procedure Laterality Date  . APPENDECTOMY    . CHOLECYSTECTOMY      Prior to Admission medications   Medication Sig Start Date End Date Taking? Authorizing Provider  acetaminophen (TYLENOL) 500 MG tablet Take 500 mg by mouth every 6 (six) hours as needed.    [provider]  albuterol (PROVENTIL HFA;VENTOLIN HFA) 108 (90 Base) MCG/ACT inhaler Inhale 2 puffs into the lungs every 6  (six) hours as needed for wheezing or shortness of breath. 10/07/18   Menshew, Charlesetta Ivory, PA-C  amoxicillin-clavulanate (AUGMENTIN) 875-125 MG tablet Take 1 tablet by mouth 2 (two) times daily. 06/11/20   Rashmi Tallent, Delorise Royals, PA-C  fluticasone (FLONASE) 50 MCG/ACT nasal spray Place 1 spray into both nostrils 2 (two) times daily. 06/11/20   Kathy Wares, Delorise Royals, PA-C  hydrOXYzine (ATARAX/VISTARIL) 50 MG tablet Take 1 tablet (50 mg total) by mouth 3 (three) times daily as needed for itching. 02/26/19   Joni Reining, PA-C  meclizine (ANTIVERT) 50 MG tablet Take 1 tablet (50 mg total) by mouth 3 (three) times daily as needed. 06/11/20   Tanaia Hawkey, Delorise Royals, PA-C  meloxicam (MOBIC) 15 MG tablet Take 1 tablet (15 mg total) by mouth daily. 04/19/20   Rankin Coolman, Delorise Royals, PA-C  methocarbamol (ROBAXIN) 500 MG tablet Take 1 tablet (500 mg total) by mouth 4 (four) times daily. 04/19/20   Shawntee Mainwaring, Delorise Royals, PA-C    Allergies Latex  Family History  Problem Relation Age of Onset  . Diabetes Mother   . Migraines Father   . Diverticulitis Father   . Cancer Maternal Grandmother   . Stroke Maternal Grandmother   . Heart disease Maternal Grandmother   . Cancer Paternal Grandmother   . Heart disease Paternal Grandfather   . Stroke Paternal Grandfather     Social History Social History   Tobacco Use  . Smoking status: Never Smoker  .  Smokeless tobacco: Never Used  Vaping Use  . Vaping Use: Never used  Substance Use Topics  . Alcohol use: No  . Drug use: No     Review of Systems  Constitutional: No fever/chills Eyes: No visual changes. No discharge ENT: Nasal congestion Cardiovascular: Positive for palpitations.  Intermittent nonspecific mild chest pain. Respiratory: no cough. No SOB. Gastrointestinal: No abdominal pain.  No nausea, no vomiting.  No diarrhea.  No constipation. Genitourinary: Negative for dysuria. No hematuria Musculoskeletal: Negative for musculoskeletal  pain. Skin: Negative for rash, abrasions, lacerations, ecchymosis. Neurological: Positive for intermittent dizziness/feeling like the room is spinning.  Negative for headaches, focal weakness or numbness.  10 System ROS otherwise negative.  ____________________________________________   PHYSICAL EXAM:  VITAL SIGNS: ED Triage Vitals  Enc Vitals Group     BP 06/11/20 1013 113/64     Pulse Rate 06/11/20 1013 (!) 102     Resp 06/11/20 1013 18     Temp 06/11/20 1013 98.3 F (36.8 C)     Temp Source 06/11/20 1013 Oral     SpO2 06/11/20 1013 100 %     Weight 06/11/20 1014 200 lb (90.7 kg)     Height 06/11/20 1014 5\' 2"  (1.575 m)     Head Circumference --      Peak Flow --      Pain Score 06/11/20 1013 4     Pain Loc --      Pain Edu? --      Excl. in GC? --      Constitutional: Alert and oriented. Well appearing and in no acute distress. Eyes: Conjunctivae are normal. PERRL. EOMI. Head: Atraumatic. ENT:      Ears: EACs with no acute findings.  TMs are mildly bulging bilaterally, worse on the right than left.      Nose: Mild clear congestion/rhinnorhea.  Tender to percussion over the frontal and maxillary sinuses with maxillary sinuses being more tender.      Mouth/Throat: Mucous membranes are moist.  No gross erythema or edema.  Uvula is midline. Neck: No stridor.  Neck is supple full range of motion Hematological/Lymphatic/Immunilogical: No cervical lymphadenopathy. Cardiovascular: Normal rate, regular rhythm.  Currently not tachycardic.  Arrival heart rate was 102, currently 80.  Normal S1 and S2.  No murmurs, rubs, gallops.  Good peripheral circulation. Respiratory: Normal respiratory effort without tachypnea or retractions. Lungs CTAB. Good air entry to the bases with no decreased or absent breath sounds. Gastrointestinal: Bowel sounds 4 quadrants. Soft and nontender to palpation. No guarding or rigidity. No palpable masses. No distention. No CVA tenderness. Musculoskeletal:  Full range of motion to all extremities. No gross deformities appreciated. Neurologic:  Normal speech and language. No gross focal neurologic deficits are appreciated.  Cranial nerves II through XII grossly intact.  Negative Romberg's and pronator drift.  Equal grip strength bilaterally. Skin:  Skin is warm, dry and intact. No rash noted. Psychiatric: Mood and affect are normal. Speech and behavior are normal. Patient exhibits appropriate insight and judgement.   ____________________________________________   LABS (all labs ordered are listed, but only abnormal results are displayed)  Labs Reviewed  BASIC METABOLIC PANEL - Abnormal; Notable for the following components:      Result Value   Glucose, Bld 111 (*)    All other components within normal limits  CBC  POC URINE PREG, ED  TROPONIN I (HIGH SENSITIVITY)  TROPONIN I (HIGH SENSITIVITY)   ____________________________________________  EKG   ____________________________________________  RADIOLOGY I  personally viewed and evaluated these images as part of my medical decision making, as well as reviewing the written report by the radiologist.  ED Provider Interpretation: I concur with radiologist finding of no acute cardiopulmonary abnormality.  DG Chest 2 View  Result Date: 06/11/2020 CLINICAL DATA:  Chest pain EXAM: CHEST - 2 VIEW COMPARISON:  02/27/2016 FINDINGS: The heart size and mediastinal contours are within normal limits. Both lungs are clear. The visualized skeletal structures are unremarkable. IMPRESSION: No acute abnormality of the lungs. Electronically Signed   By: Lauralyn Primes M.D.   On: 06/11/2020 10:50    ____________________________________________    PROCEDURES  Procedure(s) performed:    Procedures    Medications  meclizine (ANTIVERT) tablet 50 mg (has no administration in time range)     ____________________________________________   INITIAL IMPRESSION / ASSESSMENT AND PLAN / ED  COURSE  Pertinent labs & imaging results that were available during my care of the patient were reviewed by me and considered in my medical decision making (see chart for details).  Review of the [place] CSRS was performed in accordance of the [facility] prior to dispensing any controlled drugs.           Patient's diagnosis is consistent with sinusitis, vertigo, nonspecific chest pain.  Patient presented to the emergency department complaining of nasal congestion, sinus pressure, dizziness, intermittent palpitations with associated chest pain.  Overall exam is reassuring.  Labs are reassuring in regards to department, white blood cell count.  No acute findings on EKG.  Patient does has findings on physical exam consistent with eustachian tube dysfunction secondary to maxillary sinusitis.  I feel that this is also causing vertigo symptoms with a spinning sensation.  Patient will be placed on Augmentin, Flonase, meclizine for symptom relief.  Return precautions discussed with the patient.  Patient is stable for discharge at this time. Patient is given ED precautions to return to the ED for any worsening or new symptoms.     ____________________________________________  FINAL CLINICAL IMPRESSION(S) / ED DIAGNOSES  Final diagnoses:  Vertigo  Nonspecific chest pain  Acute maxillary sinusitis, recurrence not specified      NEW MEDICATIONS STARTED DURING THIS VISIT:  ED Discharge Orders         Ordered    fluticasone (FLONASE) 50 MCG/ACT nasal spray  2 times daily        06/11/20 1653    meclizine (ANTIVERT) 50 MG tablet  3 times daily PRN        06/11/20 1653    amoxicillin-clavulanate (AUGMENTIN) 875-125 MG tablet  2 times daily        06/11/20 1653              This chart was dictated using voice recognition software/Dragon. Despite best efforts to proofread, errors can occur which can change the meaning. Any change was purely unintentional.    Racheal Patches,  PA-C 06/11/20 1654    Dionne Bucy, MD 06/12/20 1517

## 2020-07-08 ENCOUNTER — Encounter: Payer: Self-pay | Admitting: Emergency Medicine

## 2020-07-08 ENCOUNTER — Emergency Department: Payer: Medicaid Other

## 2020-07-08 ENCOUNTER — Emergency Department
Admission: EM | Admit: 2020-07-08 | Discharge: 2020-07-08 | Disposition: A | Discharge status: Home / Self Care | Payer: Medicaid Other | Attending: Emergency Medicine | Admitting: Emergency Medicine

## 2020-07-08 ENCOUNTER — Other Ambulatory Visit: Payer: Self-pay

## 2020-07-08 DIAGNOSIS — I10 Essential (primary) hypertension: Secondary | ICD-10-CM | POA: Diagnosis not present

## 2020-07-08 DIAGNOSIS — Z91040 Latex allergy status: Secondary | ICD-10-CM | POA: Insufficient documentation

## 2020-07-08 DIAGNOSIS — U071 COVID-19: Secondary | ICD-10-CM | POA: Insufficient documentation

## 2020-07-08 DIAGNOSIS — M791 Myalgia, unspecified site: Secondary | ICD-10-CM | POA: Diagnosis present

## 2020-07-08 LAB — BASIC METABOLIC PANEL
Anion gap: 8 (ref 5–15)
BUN: 6 mg/dL (ref 6–20)
CO2: 27 mmol/L (ref 22–32)
Calcium: 9 mg/dL (ref 8.9–10.3)
Chloride: 100 mmol/L (ref 98–111)
Creatinine, Ser: 0.67 mg/dL (ref 0.44–1.00)
GFR, Estimated: >60 mL/min (ref >60)
Glucose, Bld: 111 mg/dL — ABNORMAL HIGH (ref 70–99)
Potassium: 4.3 mmol/L (ref 3.5–5.1)
Sodium: 135 mmol/L (ref 135–145)

## 2020-07-08 LAB — CBC
HCT: 42.1 % (ref 36.0–46.0)
Hemoglobin: 13.9 g/dL (ref 12.0–15.0)
MCH: 29.6 pg (ref 26.0–34.0)
MCHC: 33 g/dL (ref 30.0–36.0)
MCV: 89.6 fL (ref 80.0–100.0)
Platelets: 302 10*3/uL (ref 150–400)
RBC: 4.7 MIL/uL (ref 3.87–5.11)
RDW: 12.1 % (ref 11.5–15.5)
WBC: 8.6 10*3/uL (ref 4.0–10.5)
nRBC: 0 % (ref 0.0–0.2)

## 2020-07-08 LAB — TROPONIN I (HIGH SENSITIVITY): Troponin I (High Sensitivity): <2 ng/L (ref <18)

## 2020-07-08 MED ORDER — PREDNISONE 20 MG PO TABS
50.0000 mg | ORAL_TABLET | Freq: Once | ORAL | Status: AC
  Administered 2020-07-08: 50 mg via ORAL
  Filled 2020-07-08: qty 2

## 2020-07-08 MED ORDER — KETOROLAC TROMETHAMINE 30 MG/ML IJ SOLN
10.0000 mg | Freq: Once | INTRAMUSCULAR | Status: AC
  Administered 2020-07-08: 9.9 mg via INTRAVENOUS
  Filled 2020-07-08: qty 1

## 2020-07-08 MED ORDER — HYDROCOD POLST-CPM POLST ER 10-8 MG/5ML PO SUER: 5.0000 mL | Freq: Two times a day (BID) | ORAL | 0 refills | Status: DC

## 2020-07-08 MED ORDER — HYDROCOD POLST-CPM POLST ER 10-8 MG/5ML PO SUER
5.0000 mL | Freq: Once | ORAL | Status: AC
  Administered 2020-07-08: 5 mL via ORAL
  Filled 2020-07-08: qty 5

## 2020-07-08 MED ORDER — ONDANSETRON 4 MG PO TBDP: 4.0000 mg | ORAL_TABLET | Freq: Three times a day (TID) | ORAL | 0 refills | Status: DC | PRN

## 2020-07-08 MED ORDER — AZITHROMYCIN 250 MG PO TABS: 250.0000 mg | ORAL_TABLET | Freq: Every day | ORAL | 0 refills | Status: DC

## 2020-07-08 MED ORDER — PREDNISONE 50 MG PO TABS: ORAL_TABLET | ORAL | 0 refills | Status: DC

## 2020-07-08 MED ORDER — ONDANSETRON HCL 4 MG/2ML IJ SOLN
4.0000 mg | Freq: Once | INTRAMUSCULAR | Status: AC
  Administered 2020-07-08: 4 mg via INTRAVENOUS
  Filled 2020-07-08: qty 2

## 2020-07-08 MED ORDER — SODIUM CHLORIDE 0.9 % IV BOLUS
1000.0000 mL | Freq: Once | INTRAVENOUS | Status: AC
  Administered 2020-07-08: 1000 mL via INTRAVENOUS

## 2020-07-08 MED ORDER — ALBUTEROL SULFATE HFA 108 (90 BASE) MCG/ACT IN AERS: 2.0000 | INHALATION_SPRAY | RESPIRATORY_TRACT | 0 refills | Status: DC | PRN

## 2020-07-08 NOTE — ED Notes (Signed)
D/C, new RX, and quarantine status discussed with pt, pt verbalize understanding. NO distress noted. VSS. IV removed.

## 2020-07-08 NOTE — Discharge Instructions (Signed)
You will be called by the antibody infusion clinic to see if you are a candidate to receive monoclonal antibodies. Finish steroid as prescribed (Prednisone 50 mg daily x4 days). Finish antibiotic as prescribed (Azithromycin 250 mg daily x4 days). You may take cough medicine as needed. You may use Albuterol inhaler 2 puffs every 4 hours as needed for cough/difficulty breathing. Return to the ER for worsening symptoms, persistent vomiting, difficulty breathing or other concerns.

## 2020-07-08 NOTE — ED Notes (Signed)
Pt states that she started feeling bad two days ago and got tested for covid which came back positive today. Pt states she's had weakness, body aches, mild fevers, slight cp and shob, nausea and diarrhea.

## 2020-07-08 NOTE — ED Triage Notes (Signed)
Pt reports generalized body aches, chills, for 2 days. Nasal congestions, cough. Pt talks in complete sentences no respiratory distress noted

## 2020-07-08 NOTE — ED Provider Notes (Signed)
[facility] Emergency Department Provider Note   ____________________________________________   Event Date/Time   First MD Initiated Contact with Patient 07/08/20 [phone]     (approximate)  I have reviewed the triage vital signs and the nursing notes.   HISTORY  Chief Complaint Generalized Body Aches and Chills    HPI Mariah Richardson is a 34 y.o. female who presents to the ED from home with a 2-day history of body aches, chills, nasal congestion, fatigue, nausea and dry cough. Patient was tested for COVID-19 and found out yesterday afternoon that she tested positive. Patient is fully vaccinated. Denies fever, chest pain, shortness of breath, abdominal pain, vomiting or diarrhea.     Past Medical History:  Diagnosis Date  . Gastritis   . Hypertension     Patient Active Problem List   Diagnosis Date Noted  . 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 Glaucoma 07/30/2016    Past Surgical History:  Procedure Laterality Date  . APPENDECTOMY    . CHOLECYSTECTOMY      Prior to Admission medications   Medication Sig Start Date End Date Taking? Authorizing Provider  acetaminophen (TYLENOL) 500 MG tablet Take 500 mg by mouth every 6 (six) hours as needed.    [provider]  albuterol (PROVENTIL HFA;VENTOLIN HFA) 108 (90 Base) MCG/ACT inhaler Inhale 2 puffs into the lungs every 6 (six) hours as needed for wheezing or shortness of breath. 10/07/18   Menshew, Charlesetta Ivory, PA-C  albuterol (VENTOLIN HFA) 108 (90 Base) MCG/ACT inhaler Inhale 2 puffs into the lungs every 4 (four) hours as needed for wheezing or shortness of breath. 07/08/20   Irean Hong, MD  amoxicillin-clavulanate (AUGMENTIN) 875-125 MG tablet Take 1 tablet by mouth 2 (two) times daily. 06/11/20   Cuthriell, Delorise Royals, PA-C  azithromycin (ZITHROMAX) 250 MG tablet Take 1 tablet (250 mg total) by mouth daily. 07/08/20   Irean Hong,  MD  chlorpheniramine-HYDROcodone (TUSSIONEX PENNKINETIC ER) 10-8 MG/5ML SUER Take 5 mLs by mouth 2 (two) times daily. 07/08/20   Irean Hong, MD  fluticasone (FLONASE) 50 MCG/ACT nasal spray Place 1 spray into both nostrils 2 (two) times daily. 06/11/20   Cuthriell, Delorise Royals, PA-C  hydrOXYzine (ATARAX/VISTARIL) 50 MG tablet Take 1 tablet (50 mg total) by mouth 3 (three) times daily as needed for itching. 02/26/19   Joni Reining, PA-C  meclizine (ANTIVERT) 50 MG tablet Take 1 tablet (50 mg total) by mouth 3 (three) times daily as needed. 06/11/20   Cuthriell, Delorise Royals, PA-C  meloxicam (MOBIC) 15 MG tablet Take 1 tablet (15 mg total) by mouth daily. 04/19/20   Cuthriell, Delorise Royals, PA-C  methocarbamol (ROBAXIN) 500 MG tablet Take 1 tablet (500 mg total) by mouth 4 (four) times daily. 04/19/20   Cuthriell, Delorise Royals, PA-C  ondansetron (ZOFRAN ODT) 4 MG disintegrating tablet Take 1 tablet (4 mg total) by mouth every 8 (eight) hours as needed for nausea or vomiting. 07/08/20   Irean Hong, MD  predniSONE (DELTASONE) 50 MG tablet 1 tablet daily x 4 days 07/08/20   Irean Hong, MD    Allergies Latex  Family History  Problem Relation Age of Onset  . Diabetes Mother   . Migraines Father   . Diverticulitis Father   . Cancer Maternal Grandmother   . Stroke Maternal Grandmother   . Heart disease Maternal Grandmother   . Cancer Paternal Grandmother   .  Heart disease Paternal Grandfather   . Stroke Paternal Grandfather     Social History Social History   Tobacco Use  . Smoking status: Never Smoker  . Smokeless tobacco: Never Used  Vaping Use  . Vaping Use: Never used  Substance Use Topics  . Alcohol use: No  . Drug use: No    Review of Systems  Constitutional: Positive for body aches, fatigue and malaise. No fever/chills Eyes: No visual changes. ENT: Positive for nasal congestion. No sore throat. Cardiovascular: Denies chest pain. Respiratory: Positive for dry cough. denies  shortness of breath. Gastrointestinal: No abdominal pain. Positive for nausea, no vomiting.  No diarrhea.  No constipation. Genitourinary: Negative for dysuria. Musculoskeletal: Negative for back pain. Skin: Negative for rash. Neurological: Negative for headaches, focal weakness or numbness.   ____________________________________________   PHYSICAL EXAM:  VITAL SIGNS: ED Triage Vitals  Enc Vitals Group     BP 07/08/20 0206 (!) 131/49     Pulse Rate 07/08/20 0206 (!) 113     Resp 07/08/20 0206 20     Temp 07/08/20 0206 98.4 F (36.9 C)     Temp Source 07/08/20 0206 Oral     SpO2 07/08/20 0206 99 %     Weight 07/08/20 0208 206 lb (93.4 kg)     Height 07/08/20 0208 5\' 2"  (1.575 m)     Head Circumference --      Peak Flow --      Pain Score 07/08/20 0208 10     Pain Loc --      Pain Edu? --      Excl. in GC? --     Constitutional: Alert and oriented. Well appearing and in mild acute distress. Eyes: Conjunctivae are normal. PERRL. EOMI. Head: Atraumatic. Nose: No congestion/rhinnorhea. Mouth/Throat: Mucous membranes are mildly dry.  Neck: No stridor.   Cardiovascular: Normal rate, regular rhythm. Grossly normal heart sounds.  Good peripheral circulation. Respiratory: Normal respiratory effort.  No retractions. Lungs CTAB. Gastrointestinal: Soft and nontender. No distention. No abdominal bruits. No CVA tenderness. Musculoskeletal: No lower extremity tenderness nor edema.  No joint effusions. Neurologic:  Normal speech and language. No gross focal neurologic deficits are appreciated. No gait instability. Skin:  Skin is warm, dry and intact. No rash noted. No petechiae. Psychiatric: Mood and affect are normal. Speech and behavior are normal.  ____________________________________________   LABS (all labs ordered are listed, but only abnormal results are displayed)  Labs Reviewed  BASIC METABOLIC PANEL - Abnormal; Notable for the following components:      Result Value    Glucose, Bld 111 (*)    All other components within normal limits  CBC  TROPONIN I (HIGH SENSITIVITY)   ____________________________________________  EKG  ED ECG REPORT I, Karriem Muench J, the attending physician, personally viewed and interpreted this ECG.   Date: 07/08/2020  EKG Time: 0210  Rate: 110  Rhythm: sinus tachycardia  Axis: Normal  Intervals:none  ST&T Change: Nonspecific  ____________________________________________  RADIOLOGY I, Krissi Willaims J, personally viewed and evaluated these images (plain radiographs) as part of my medical decision making, as well as reviewing the written report by the radiologist.  ED MD interpretation: No acute cardiopulmonary process  Official radiology report(s): DG Chest 2 View  Result Date: 07/08/2020 CLINICAL DATA:  Generalized body aches, chills for 2 days, nasal congestion, cough EXAM: CHEST - 2 VIEW COMPARISON:  Radiograph 06/11/2020 FINDINGS: Accounting for low volumes and body habitus, the lungs are clear. No consolidation, features of edema, pneumothorax,  or effusion. Pulmonary vascularity is normally distributed. The cardiomediastinal contours are unremarkable. No acute osseous or soft tissue abnormality. IMPRESSION: No acute cardiopulmonary abnormality. Electronically Signed   By: Kreg Shropshire M.D.   On: 07/08/2020 03:56    ____________________________________________   PROCEDURES  Procedure(s) performed (including Critical Care):  Procedures   ____________________________________________   INITIAL IMPRESSION / ASSESSMENT AND PLAN / ED COURSE  As part of my medical decision making, I reviewed the following data within the electronic MEDICAL RECORD NUMBER Nursing notes reviewed and incorporated, Labs reviewed, EKG interpreted, Old chart reviewed (07/06/2020 urgent care visit for Covid testing), Radiograph reviewed and Notes from prior ED visits     34 year old Covid positive female presenting with generalized malaise,  fatigue, cough and nausea. Differential diagnosis includes but is not limited to viral process, infectious, metabolic etiologies, etc.  Laboratory and imaging studies unremarkable. Will administer IV hydration, IV Toradol for body aches, Tussionex for cough. Start prednisone and azithromycin. Will refer patient to antibiotic infusion clinic. Strict return precautions given. Patient verbalizes understanding agrees with plan of care.      ____________________________________________   FINAL CLINICAL IMPRESSION(S) / ED DIAGNOSES  Final diagnoses:  COVID-19     ED Discharge Orders         Ordered    predniSONE (DELTASONE) 50 MG tablet        07/08/20 0559    azithromycin (ZITHROMAX) 250 MG tablet  Daily        07/08/20 0559    ondansetron (ZOFRAN ODT) 4 MG disintegrating tablet  Every 8 hours PRN        07/08/20 0559    chlorpheniramine-HYDROcodone (TUSSIONEX PENNKINETIC ER) 10-8 MG/5ML SUER  2 times daily        07/08/20 0559    albuterol (VENTOLIN HFA) 108 (90 Base) MCG/ACT inhaler  Every 4 hours PRN        07/08/20 0559          *Please note:  Mariah Richardson was evaluated in Emergency Department on 07/08/2020 for the symptoms described in the history of present illness. She was evaluated in the context of the global COVID-19 pandemic, which necessitated consideration that the patient might be at risk for infection with the SARS-CoV-2 virus that causes COVID-19. Institutional protocols and algorithms that pertain to the evaluation of patients at risk for COVID-19 are in a state of rapid change based on information released by regulatory bodies including the CDC and federal and state organizations. These policies and algorithms were followed during the patient's care in the ED.  Some ED evaluations and interventions may be delayed as a result of limited staffing during and the pandemic.*   Note:  This document was prepared using Dragon voice recognition software and may include  unintentional dictation errors.   Irean Hong, MD 07/08/20 [phone]

## 2020-07-08 NOTE — ED Notes (Signed)
Pt currently resting with no distress at this time. IVF infusing. NAD noted at this time. Call bell in reach.

## 2020-07-08 NOTE — ED Triage Notes (Signed)
Pt reports she tested positive for COVID 19 2 days ago

## 2020-07-08 NOTE — ED Notes (Signed)
Pt went to restroom and pulled the help string in restroom, went to check on pt in restroom, pt was walking out the restroom when I arrived at the door, pt stated she pulled string because of feeling short of breath. I checked vitals few minutes after incident, oxygen at 100 percent.

## 2020-07-09 ENCOUNTER — Telehealth: Payer: Self-pay | Admitting: Unknown Physician Specialty

## 2020-07-09 ENCOUNTER — Other Ambulatory Visit: Payer: Self-pay | Admitting: Unknown Physician Specialty

## 2020-07-09 ENCOUNTER — Encounter: Payer: Self-pay | Admitting: Unknown Physician Specialty

## 2020-07-09 DIAGNOSIS — U071 COVID-19: Secondary | ICD-10-CM

## 2020-07-09 NOTE — Telephone Encounter (Signed)
I connected by phone with Mariah Richardson on 07/09/2020 at 12:01 PM to discuss the potential use of a new treatment for mild to moderate COVID-19 viral infection in non-hospitalized patients.  This patient is a 34 y.o. female that meets the FDA criteria for Emergency Use Authorization of COVID monoclonal antibody casirivimab/imdevimab, bamlanivimab/etesevimab, or sotrovimab.  Has a (+) direct SARS-CoV-2 viral test result  Has mild or moderate COVID-19   Is NOT hospitalized due to COVID-19  Is within 10 days of symptom onset  Has at least one of the high risk factor(s) for progression to severe COVID-19 and/or hospitalization as defined in EUA.  Specific high risk criteria : BMI > 25   I have spoken and communicated the following to the patient or parent/caregiver regarding COVID monoclonal antibody treatment:  1. FDA has authorized the emergency use for the treatment of mild to moderate COVID-19 in adults and pediatric patients with positive results of direct SARS-CoV-2 viral testing who are 53 years of age and older weighing at least 40 kg, and who are at high risk for progressing to severe COVID-19 and/or hospitalization.  2. The significant known and potential risks and benefits of COVID monoclonal antibody, and the extent to which such potential risks and benefits are unknown.  3. Information on available alternative treatments and the risks and benefits of those alternatives, including clinical trials.  4. Patients treated with COVID monoclonal antibody should continue to self-isolate and use infection control measures (e.g., wear mask, isolate, social distance, avoid sharing personal items, clean and disinfect "high touch" surfaces, and frequent handwashing) according to CDC guidelines.   5. The patient or parent/caregiver has the option to accept or refuse COVID monoclonal antibody treatment.  After reviewing this information with the patient, the patient has agreed to receive  one of the available covid 19 monoclonal antibodies and will be provided an appropriate fact sheet prior to infusion. Gabriel Cirri, NP 07/09/2020 12:01 PM  Sx onset 12/16

## 2020-07-10 ENCOUNTER — Ambulatory Visit (HOSPITAL_COMMUNITY): Payer: No Typology Code available for payment source

## 2020-07-22 NOTE — L&D Delivery Note (Signed)
Delivery Summary for Mariah Richardson  Labor Events:   Preterm labor: No data found  Rupture date: 06/11/2021  Rupture time: 3:15 PM  Rupture type: Spontaneous Intact  Fluid Color: Clear  Induction: No data found  Augmentation: No data found  Complications: No data found  Cervical ripening: No data found No data found   No data found     Delivery:   Episiotomy: No data found  Lacerations: No data found  Repair suture: No data found  Repair # of packets: No data found  Blood loss (ml): No data found   Information for the patient's newborn:  Byanca, Kasper [[id number]]   Delivery 06/11/2021 9:06 PM by  Vaginal, Spontaneous Sex:  female Gestational Age: [redacted]w[redacted]d Delivery Clinician:   Living?:         APGARS  One minute Five minutes Ten minutes  Skin color:        Heart rate:        Grimace:        Muscle tone:        Breathing:        Totals: 8  9      Presentation/position:      Resuscitation:   Cord information:    Disposition of cord blood:     Blood gases sent?  Complications:   Placenta: Delivered:       appearance Newborn Measurements: Weight: 7 lb 11.1 oz (3490 g)  Height: 20.08"  Head circumference:    Chest circumference:    Other providers:    Additional  information: Forceps:   Vacuum:   Breech:   Observed anomalies     Delivery Note At 9:06 PM a viable and healthy female "Omega" was delivered via Vaginal, Spontaneous (Presentation: Vertex; ROA position).  APGAR: 8, 9; weight 3490 grams.   Placenta status: Spontaneous, Intact.  Cord: 3 vessels with the following complications: None.  Cord pH: not obtained.  Delayed cord clamping observed.   Anesthesia:  Epidural Episiotomy: None Lacerations: None Suture Repair:  None Est. Blood Loss (mL):  50 ml  Mom to postpartum.  Baby to Couplet care / Skin to Skin.  Hildred Laser, MD 06/11/2021, 9:17 PM

## 2020-10-09 ENCOUNTER — Telehealth: Payer: Self-pay

## 2020-10-09 DIAGNOSIS — R768 Other specified abnormal immunological findings in serum: Secondary | ICD-10-CM

## 2020-10-09 NOTE — Telephone Encounter (Signed)
TC with patient re: RPR results from local urgent care (care everywhere).  Patient states has always had a false positive. RN reviewed past results and explained possible causes of false positive results.  Patient verbalized understanding to f/u with PCP.Richmond Campbell, RN

## 2020-10-14 ENCOUNTER — Emergency Department
Admission: EM | Admit: 2020-10-14 | Discharge: 2020-10-14 | Disposition: A | Discharge status: Home / Self Care | Payer: Medicaid Other | Attending: Emergency Medicine | Admitting: Emergency Medicine

## 2020-10-14 ENCOUNTER — Emergency Department: Payer: Medicaid Other

## 2020-10-14 ENCOUNTER — Other Ambulatory Visit: Payer: Self-pay

## 2020-10-14 DIAGNOSIS — O26891 Other specified pregnancy related conditions, first trimester: Secondary | ICD-10-CM | POA: Diagnosis present

## 2020-10-14 DIAGNOSIS — Z91040 Latex allergy status: Secondary | ICD-10-CM | POA: Diagnosis not present

## 2020-10-14 DIAGNOSIS — R109 Unspecified abdominal pain: Secondary | ICD-10-CM | POA: Diagnosis not present

## 2020-10-14 DIAGNOSIS — Z3A01 Less than 8 weeks gestation of pregnancy: Secondary | ICD-10-CM | POA: Diagnosis not present

## 2020-10-14 DIAGNOSIS — Z880 Allergy status to penicillin: Secondary | ICD-10-CM | POA: Insufficient documentation

## 2020-10-14 DIAGNOSIS — Z9049 Acquired absence of other specified parts of digestive tract: Secondary | ICD-10-CM | POA: Insufficient documentation

## 2020-10-14 DIAGNOSIS — R110 Nausea: Secondary | ICD-10-CM | POA: Insufficient documentation

## 2020-10-14 DIAGNOSIS — R102 Pelvic and perineal pain: Secondary | ICD-10-CM

## 2020-10-14 LAB — URINALYSIS, COMPLETE (UACMP) WITH MICROSCOPIC
Bilirubin Urine: NEGATIVE
Glucose, UA: NEGATIVE mg/dL
Hgb urine dipstick: NEGATIVE
Ketones, ur: NEGATIVE mg/dL
Nitrite: NEGATIVE
Protein, ur: NEGATIVE mg/dL
Specific Gravity, Urine: 1.019 (ref 1.005–1.030)
pH: 6 (ref 5.0–8.0)

## 2020-10-14 LAB — CHLAMYDIA/NGC RT PCR (ARMC ONLY)
Chlamydia Tr: NOT DETECTED
N gonorrhoeae: NOT DETECTED

## 2020-10-14 LAB — CBC WITH DIFFERENTIAL/PLATELET
Abs Immature Granulocytes: 0.01 10*3/uL (ref 0.00–0.07)
Basophils Absolute: 0 10*3/uL (ref 0.0–0.1)
Basophils Relative: 0 %
Eosinophils Absolute: 0.1 10*3/uL (ref 0.0–0.5)
Eosinophils Relative: 1 %
HCT: 39.3 % (ref 36.0–46.0)
Hemoglobin: 13.1 g/dL (ref 12.0–15.0)
Immature Granulocytes: 0 %
Lymphocytes Relative: 33 %
Lymphs Abs: 2 10*3/uL (ref 0.7–4.0)
MCH: 29.6 pg (ref 26.0–34.0)
MCHC: 33.3 g/dL (ref 30.0–36.0)
MCV: 88.7 fL (ref 80.0–100.0)
Monocytes Absolute: 0.6 10*3/uL (ref 0.1–1.0)
Monocytes Relative: 10 %
Neutro Abs: 3.5 10*3/uL (ref 1.7–7.7)
Neutrophils Relative %: 56 %
Platelets: 298 10*3/uL (ref 150–400)
RBC: 4.43 MIL/uL (ref 3.87–5.11)
RDW: 12.7 % (ref 11.5–15.5)
WBC: 6.3 10*3/uL (ref 4.0–10.5)
nRBC: 0 % (ref 0.0–0.2)

## 2020-10-14 LAB — LIPASE, BLOOD: Lipase: 39 U/L (ref 11–51)

## 2020-10-14 LAB — COMPREHENSIVE METABOLIC PANEL
ALT: 23 U/L (ref 0–44)
AST: 21 U/L (ref 15–41)
Albumin: 3.7 g/dL (ref 3.5–5.0)
Alkaline Phosphatase: 37 U/L — ABNORMAL LOW (ref 38–126)
Anion gap: 6 (ref 5–15)
BUN: 9 mg/dL (ref 6–20)
CO2: 24 mmol/L (ref 22–32)
Calcium: 8.7 mg/dL — ABNORMAL LOW (ref 8.9–10.3)
Chloride: 107 mmol/L (ref 98–111)
Creatinine, Ser: 0.69 mg/dL (ref 0.44–1.00)
GFR, Estimated: >60 mL/min (ref >60)
Glucose, Bld: 104 mg/dL — ABNORMAL HIGH (ref 70–99)
Potassium: 4.2 mmol/L (ref 3.5–5.1)
Sodium: 137 mmol/L (ref 135–145)
Total Bilirubin: 0.5 mg/dL (ref 0.3–1.2)
Total Protein: 7.2 g/dL (ref 6.5–8.1)

## 2020-10-14 LAB — WET PREP, GENITAL
Clue Cells Wet Prep HPF POC: NONE SEEN
Sperm: NONE SEEN
Trich, Wet Prep: NONE SEEN
WBC, Wet Prep HPF POC: NONE SEEN
Yeast Wet Prep HPF POC: NONE SEEN

## 2020-10-14 LAB — PREGNANCY, URINE: Preg Test, Ur: POSITIVE — AB

## 2020-10-14 LAB — HCG, QUANTITATIVE, PREGNANCY: hCG, Beta Chain, Quant, S: 1071 m[IU]/mL — ABNORMAL HIGH (ref <5)

## 2020-10-14 MED ORDER — ONDANSETRON HCL 4 MG/2ML IJ SOLN
4.0000 mg | Freq: Once | INTRAMUSCULAR | Status: AC
  Administered 2020-10-14: 4 mg via INTRAVENOUS
  Filled 2020-10-14: qty 2

## 2020-10-14 MED ORDER — ACETAMINOPHEN 500 MG PO TABS
1000.0000 mg | ORAL_TABLET | ORAL | Status: AC
  Administered 2020-10-14: 1000 mg via ORAL
  Filled 2020-10-14: qty 2

## 2020-10-14 MED ORDER — SODIUM CHLORIDE 0.9 % IV BOLUS
500.0000 mL | Freq: Once | INTRAVENOUS | Status: AC
  Administered 2020-10-14: 500 mL via INTRAVENOUS

## 2020-10-14 MED ORDER — ONDANSETRON 4 MG PO TBDP
4.0000 mg | ORAL_TABLET | Freq: Four times a day (QID) | ORAL | 0 refills | Status: DC | PRN
Start: 2020-10-14 — End: 2020-11-06

## 2020-10-14 NOTE — ED Triage Notes (Signed)
t states she has been having abdominal pain x3 days- pt states it is getting worse and the pain is sharp- pt states it is the whole abdomen and pelvic area- pt states she has had some nausea but denies v/d

## 2020-10-14 NOTE — Discharge Instructions (Signed)
It is very important to have follow-up Monday with an obstetrician, please call Monday morning to facilitate this through [facility] clinic.

## 2020-10-14 NOTE — ED Provider Notes (Signed)
[facility] Emergency Department Provider Note   ____________________________________________   Event Date/Time   First MD Initiated Contact with Patient 10/14/20 [phone]     (approximate)  I have reviewed the triage vital signs and the nursing notes.   HISTORY  Chief Complaint Abdominal Pain    HPI Mariah Richardson is a 35 y.o. female with a history of previous cholecystectomy and appendectomy.  2 days ago started greased to have some crampy abdominal pain sort of in her mid abdomen stabs towards her back at times and sort of hard to define any 1 particular spot at times the whole abdomen feels like it hurts but most the time it tends to be in the middle  No vaginal bleeding last menstrual cycle was the end of February.  Some nausea but no vomiting.  No diarrhea.  Denies any chills no chest pain no fever no pain or burning with urination.  No unusual vaginal discharge or bleeding  Currently relates the pain is moderate fairly sharp and located mostly in the middle abdomen to mid lower abdomen  Patient reports that around March 5 she did at home artificial insemination from a cryo bank, this was facilitated by her physician. Patient also reports 3 previous pregnancies  but relates that she has never had pain like this during any of those    Past Medical History:  Diagnosis Date  . Gastritis   . Hypertension     Patient Active Problem List   Diagnosis Date Noted  . 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 Glaucoma 07/30/2016    Past Surgical History:  Procedure Laterality Date  . APPENDECTOMY    . CHOLECYSTECTOMY      Prior to Admission medications   Medication Sig Start Date End Date Taking? Authorizing Provider  ondansetron (ZOFRAN ODT) 4 MG disintegrating tablet Take 1 tablet (4 mg total) by mouth every 6 (six) hours as needed for nausea or vomiting. 10/14/20  Yes Sharyn Creamer, MD   acetaminophen (TYLENOL) 500 MG tablet Take 500 mg by mouth every 6 (six) hours as needed.    [provider]  albuterol (VENTOLIN HFA) 108 (90 Base) MCG/ACT inhaler Inhale 2 puffs into the lungs every 4 (four) hours as needed for wheezing or shortness of breath. 07/08/20   Irean Hong, MD  fluticasone (FLONASE) 50 MCG/ACT nasal spray Place 1 spray into both nostrils 2 (two) times daily. 06/11/20   Cuthriell, Delorise Royals, PA-C    Allergies Latex and Penicillins  Family History  Problem Relation Age of Onset  . Diabetes Mother   . Migraines Father   . Diverticulitis Father   . Cancer Maternal Grandmother   . Stroke Maternal Grandmother   . Heart disease Maternal Grandmother   . Cancer Paternal Grandmother   . Heart disease Paternal Grandfather   . Stroke Paternal Grandfather     Social History Social History   Tobacco Use  . Smoking status: Never Smoker  . Smokeless tobacco: Never Used  Vaping Use  . Vaping Use: Never used  Substance Use Topics  . Alcohol use: No  . Drug use: No    Review of Systems Constitutional: No fever/chills Eyes: No visual changes.   Cardiovascular: Denies chest pain. Respiratory: Denies shortness of breath. Gastrointestinal: See HPI Genitourinary: Negative for dysuria.  See HPI  Musculoskeletal: Negative for back pain. Skin: Negative for rash. Neurological: Negative for headaches, areas of focal weakness or  numbness.    ____________________________________________   PHYSICAL EXAM:  VITAL SIGNS: ED Triage Vitals [10/14/20 0827]  Enc Vitals Group     BP 132/73     Pulse Rate (!) 101     Resp 16     Temp 98.5 F (36.9 C)     Temp Source Oral     SpO2 100 %     Weight 212 lb (96.2 kg)     Height 5\' 2"  (1.575 m)     Head Circumference      Peak Flow      Pain Score 8     Pain Loc      Pain Edu?      Excl. in GC?     Constitutional: Alert and oriented. Well appearing and in no acute distress. Eyes: Conjunctivae are  normal. Head: Atraumatic. Nose: No congestion/rhinnorhea. Mouth/Throat: Mucous membranes are moist. Neck: No stridor.  Cardiovascular: Normal rate, regular rhythm. Grossly normal heart sounds.  Good peripheral circulation. Respiratory: Normal respiratory effort.  No retractions. Lungs CTAB. Gastrointestinal: Soft and reports mild tenderness somewhat throughout on all quadrants of exam but she does demonstrate more focal and increased discomfort in the suprapubic and periumbilical region.  There is no rebound or guarding.  There is no evidence of acute abdomen but she does report sort of mild tenderness throughout, but more focally suprapubically. No distention. Gynecologic performed with RN (Tammy) Musculoskeletal: No lower extremity tenderness nor edema. Neurologic:  Normal speech and language. No gross focal neurologic deficits are appreciated.  Skin:  Skin is warm, dry and intact. No rash noted. Psychiatric: Mood and affect are normal. Speech and behavior are normal.  ____________________________________________   LABS (all labs ordered are listed, but only abnormal results are displayed)  Labs Reviewed  PREGNANCY, URINE - Abnormal; Notable for the following components:      Result Value   Preg Test, Ur POSITIVE (*)    All other components within normal limits  URINALYSIS, COMPLETE (UACMP) WITH MICROSCOPIC - Abnormal; Notable for the following components:   Color, Urine YELLOW (*)    APPearance HAZY (*)    Leukocytes,Ua SMALL (*)    Bacteria, UA RARE (*)    All other components within normal limits  HCG, QUANTITATIVE, PREGNANCY - Abnormal; Notable for the following components:   hCG, Beta Chain, Quant, S 1,071 (*)    All other components within normal limits  COMPREHENSIVE METABOLIC PANEL - Abnormal; Notable for the following components:   Glucose, Bld 104 (*)    Calcium 8.7 (*)    Alkaline Phosphatase 37 (*)    All other components within normal limits  CHLAMYDIA/NGC RT PCR  ([facility] ONLY)  WET PREP, GENITAL  CBC WITH DIFFERENTIAL/PLATELET  LIPASE, BLOOD  PROGESTERONE  TYPE AND SCREEN   ____________________________________________  EKG   ____________________________________________  RADIOLOGY  OB LESS THAN 14 WEEKS WITH OB TRANSVAGINAL  Result Date: 10/14/2020 CLINICAL DATA:  35 year old pregnant female with pelvic pain for 2 days. Estimated gestational age of [redacted] weeks 4 days by LMP. Beta HCG of 1,000. EXAM: OBSTETRIC <14 WK 32 AND TRANSVAGINAL OB US TECHNIQUE: Both transabdominal and transvaginal ultrasound examinations were performed for complete evaluation of the gestation as well as the maternal uterus, adnexal regions, and pelvic cul-de-sac. Transvaginal technique was performed to assess early pregnancy. COMPARISON:  None. FINDINGS: Intrauterine gestational sac: None Yolk sac:  Not Visualized. Embryo:  Not Visualized. Maternal uterus/adnexae: The uterus is unremarkable. No significant ovarian abnormalities are present. No  adnexal mass or free fluid noted. IMPRESSION: No IUP or adnexal mass visualized. Differential diagnosis includes recent spontaneous abortion, IUP too early to visualize, and occult ectopic pregnancy. Recommend close follow up of quantitative B-HCG levels, and follow up US as clinically warranted. Electronically Signed   By: Harmon Pier M.D.   On: 10/14/2020 10:57      ____________________________________________   PROCEDURES  Procedure(s) performed: None  Procedures  Critical Care performed: No  ____________________________________________   INITIAL IMPRESSION / ASSESSMENT AND PLAN / ED COURSE  Pertinent labs & imaging results that were available during my care of the patient were reviewed by me and considered in my medical decision making (see chart for details).   Differential diagnosis includes but is not limited to, abdominal perforation, aortic dissection, cholecystitis, appendicitis, diverticulitis, colitis,  esophagitis/gastritis, kidney stone, pyelonephritis, urinary tract infection, aortic aneurysm. All are considered in decision and treatment plan. Based upon the patient's presentation and risk factors, and the patient's recent at home insemination now with positive pregnancy test my primary concern is to rule out ectopic or complications related to early pregnancy however entertaining other etiologies as well.  Lab work, ultrasound, pelvic exam all pending.   Rh positive   Clinical Course as of 10/14/20 1234  Sat Oct 14, 2020  1015 Pelvic exam performed with nurse Tammy.  Normal external and internal speculum and bimanual exam.  No cervical motion tenderness.  She does have just a slight white discharge, patient reports this is fairly chronic for her.  No adnexal tenderness [MQ]  1220 Case, labs, work-up discussed with Dr. Bonney Aid. [MQ]    Clinical Course User Index [MQ] Sharyn Creamer, MD   ----------------------------------------- 12:33 PM on 10/14/2020 -----------------------------------------  Patient resting quite comfortably now, ambulatory in the room.  Reports he feels much better.  Have reviewed her work-up discussed with her very careful return precautions and a plan to have a repeat hCG and OB/GYN visit on Monday think we have a very reassuring plan.  She is nontoxic.  I see no evidence that would clearly suggest a ruptured ectopic, reassuring work-up, reassuring labs.  Return precautions and treatment recommendations and follow-up discussed with the patient who is agreeable with the plan.   ____________________________________________   FINAL CLINICAL IMPRESSION(S) / ED DIAGNOSES  Final diagnoses:  Abdominal pain during pregnancy in first trimester        Note:  This document was prepared using Dragon voice recognition software and may include unintentional dictation errors       Sharyn Creamer, MD 10/14/20 1234

## 2020-10-14 NOTE — ED Notes (Signed)
IV catheter removed intact without complication.  D/C instructions given.  RX and follow up discussed.  All questions addressed.  Understanding verbalized.  Pt left ER ambulatory.

## 2020-10-14 NOTE — ED Triage Notes (Signed)
First Nurse Note:  Arrives c/o abdominal pain.  AAOx3.  Skin warm and dry. NAD

## 2020-10-15 LAB — TYPE AND SCREEN
ABO/RH(D): B POS
Antibody Screen: POSITIVE
Unit division: 0
Unit division: 0

## 2020-10-15 LAB — BPAM RBC
Blood Product Expiration Date: 202204272359
Blood Product Expiration Date: 202204272359
Unit Type and Rh: 7300
Unit Type and Rh: 7300

## 2020-10-16 ENCOUNTER — Other Ambulatory Visit: Payer: Self-pay

## 2020-10-16 ENCOUNTER — Telehealth: Payer: Self-pay

## 2020-10-16 ENCOUNTER — Other Ambulatory Visit: Payer: Self-pay | Admitting: Obstetrics and Gynecology

## 2020-10-16 ENCOUNTER — Encounter: Payer: Medicaid Other | Admitting: Obstetrics and Gynecology

## 2020-10-16 ENCOUNTER — Other Ambulatory Visit: Payer: Medicaid Other

## 2020-10-16 DIAGNOSIS — O3680X0 Pregnancy with inconclusive fetal viability, not applicable or unspecified: Secondary | ICD-10-CM

## 2020-10-16 NOTE — Telephone Encounter (Signed)
Patient is scheduled at 12 pm for labs and 2:10 with SDJ. Will let patient know at check in to change over medicaid . Medicaid on file out of network

## 2020-10-16 NOTE — Telephone Encounter (Signed)
-----   Message from Vena Austria, MD sent at 10/14/2020 12:22 PM EDT ----- Regarding: ER follow up Needs ER follow up MD on Monday 10/16/2020 and repeat HCG level

## 2020-10-17 LAB — BETA HCG QUANT (REF LAB): hCG Quant: 1855 m[IU]/mL

## 2020-10-18 ENCOUNTER — Other Ambulatory Visit: Payer: Self-pay

## 2020-10-18 ENCOUNTER — Ambulatory Visit (INDEPENDENT_AMBULATORY_CARE_PROVIDER_SITE_OTHER): Payer: Medicaid Other | Admitting: Obstetrics and Gynecology

## 2020-10-18 ENCOUNTER — Encounter: Payer: Self-pay | Admitting: Obstetrics and Gynecology

## 2020-10-18 VITALS — BP 130/77 | Ht 62.0 in | Wt 209.0 lb

## 2020-10-18 DIAGNOSIS — O3680X0 Pregnancy with inconclusive fetal viability, not applicable or unspecified: Secondary | ICD-10-CM | POA: Diagnosis not present

## 2020-10-18 NOTE — Progress Notes (Signed)
Obstetrics & Gynecology Office Visit   Chief Complaint  Patient presents with  . Follow-up    Back pain, stomach pain, pelvic pain  In the setting of pregnancy.  History of Present Illness: 35 y.o. [id number] female who presents in follow up from the ER on 10/14/2020 for abdominal pain.  She had a pelvic ultrasound that showed no IUP or adnexal mass.  She had an hCG on that date of 1,071.  She had a repeat hCG on 3/28 that resulted 1,855.    She has pelvic pain and back pain. She had abdominal pain that was located all over.  The abdominal pain has resolved. Her pelvic and back pain has gotten a little better, but is still present. She has had no vaginal bleeding.   Her LMP is 09/12/2020     Past Medical History:  Diagnosis Date  . Gastritis   . Hypertension     Past Surgical History:  Procedure Laterality Date  . APPENDECTOMY    . CHOLECYSTECTOMY      Gynecologic History: Patient's last menstrual period was 09/12/2020.  Obstetric History: [id number]  Family History  Problem Relation Age of Onset  . Diabetes Mother   . Migraines Father   . Diverticulitis Father   . Cancer Maternal Grandmother   . Stroke Maternal Grandmother   . Heart disease Maternal Grandmother   . Cancer Paternal Grandmother   . Heart disease Paternal Grandfather   . Stroke Paternal Grandfather     Social History   Socioeconomic History  . Marital status: Single    Spouse name: Not on file  . Number of children: Not on file  . Years of education: Not on file  . Highest education level: Not on file  Occupational History  . Not on file  Tobacco Use  . Smoking status: Never Smoker  . Smokeless tobacco: Never Used  Vaping Use  . Vaping Use: Never used  Substance and Sexual Activity  . Alcohol use: No  . Drug use: No  . Sexual activity: Yes    Birth control/protection: None  Other Topics Concern  . Not on file  Social History Narrative  . Not on file   Social Determinants of Health    Financial Resource Strain: Not on file  Food Insecurity: Not on file  Transportation Needs: Not on file  Physical Activity: Not on file  Stress: Not on file  Social Connections: Not on file  Intimate Partner Violence: Not on file    Allergies  Allergen Reactions  . Latex   . Penicillins     Prior to Admission medications   Medication Sig Start Date End Date Taking? Authorizing Provider  acetaminophen (TYLENOL) 500 MG tablet Take 500 mg by mouth every 6 (six) hours as needed.   Yes [provider]    Review of Systems  Constitutional: Negative.   HENT: Negative.   Eyes: Negative.   Respiratory: Negative.   Cardiovascular: Negative.   Gastrointestinal: Negative.  Negative for abdominal pain.  Genitourinary: Negative.   Musculoskeletal: Positive for back pain and joint pain.  Skin: Negative.   Neurological: Negative.   Psychiatric/Behavioral: Negative.      Physical Exam BP 130/77   Ht 5\' 2"  (1.575 m)   Wt 209 lb (94.8 kg)   LMP 09/12/2020   BMI 38.23 kg/m  Patient's last menstrual period was 09/12/2020. Physical Exam Constitutional:      General: She is not in acute distress.    Appearance: Normal appearance.  She is well-developed.  HENT:     Head: Normocephalic and atraumatic.  Eyes:     General: No scleral icterus.    Conjunctiva/sclera: Conjunctivae normal.  Cardiovascular:     Rate and Rhythm: Normal rate and regular rhythm.     Heart sounds: No murmur heard. No friction rub. No gallop.   Pulmonary:     Effort: Pulmonary effort is normal. No respiratory distress.     Breath sounds: Normal breath sounds. No wheezing or rales.  Abdominal:     General: Bowel sounds are normal. There is no distension.     Palpations: Abdomen is soft. There is no mass.     Tenderness: There is no abdominal tenderness. There is no guarding or rebound.  Musculoskeletal:        General: Normal range of motion.     Cervical back: Normal range of motion and neck  supple.  Neurological:     General: No focal deficit present.     Mental Status: She is alert and oriented to person, place, and time.     Cranial Nerves: No cranial nerve deficit.  Skin:    General: Skin is warm and dry.     Findings: No erythema.  Psychiatric:        Mood and Affect: Mood normal.        Behavior: Behavior normal.        Judgment: Judgment normal.   Female chaperone present for pelvic and breast  portions of the physical exam  Bedside transabdominal ultrasound shows likely small intrauterine gestational sac.  No obvious adnexal abnormalities.  Assessment: 35 y.o. G46P3003 female here for  1. Pregnancy of unknown anatomic location      Plan: Problem List Items Addressed This Visit   None   Visit Diagnoses    Pregnancy of unknown anatomic location    -  Primary   Relevant Orders   Beta hCG quant (ref lab)     Repeat beta hCG today.  Discussed expected findings in normal pregnancy. Will obtain transvaginal pelvic ultrasound in one week and, if normal, get her into regular prenatal care.   Ectopic precautions given.  A total of 22 minutes were spent face-to-face with the patient as well as preparation, review, communication, and documentation during this encounter.    Notably, she has a history of positive RPR titer (1:2) on 08/21/20 with non-reactive TP-PA testing.  Her RPR was also positive on 09/29/2020, non-reactive TP-PA Normal hgb electrophoresis H/o abnormal pap smear in 2019 with ASCUS -HPV+, NILM, HPV negative in 2021  Return in about 1 week (around 10/25/2020) for pelvic u/s by Dr. Jean Rosenthal and follow up.   Thomasene Mohair, MD 10/18/2020 11:43 AM

## 2020-10-19 ENCOUNTER — Encounter: Payer: Self-pay | Admitting: Obstetrics and Gynecology

## 2020-10-19 ENCOUNTER — Telehealth: Payer: Self-pay | Admitting: Obstetrics and Gynecology

## 2020-10-19 LAB — BETA HCG QUANT (REF LAB): hCG Quant: 4619 m[IU]/mL

## 2020-10-23 NOTE — Telephone Encounter (Signed)
LM for patient to return call.

## 2020-10-23 NOTE — Telephone Encounter (Signed)
Spoke with patient and she is going to keep her appointment with Dr. Logan Bores on Wednesday. Patient was fine with this. I let her know that Dr. Logan Bores will go over all of her concerns that day.

## 2020-10-25 ENCOUNTER — Other Ambulatory Visit: Payer: Self-pay

## 2020-10-25 ENCOUNTER — Encounter: Payer: Self-pay | Admitting: Obstetrics and Gynecology

## 2020-10-25 ENCOUNTER — Ambulatory Visit (INDEPENDENT_AMBULATORY_CARE_PROVIDER_SITE_OTHER): Payer: Medicaid Other | Admitting: Obstetrics and Gynecology

## 2020-10-25 VITALS — BP 123/82 | HR 98 | Ht 62.0 in | Wt 209.8 lb

## 2020-10-25 DIAGNOSIS — Z7689 Persons encountering health services in other specified circumstances: Secondary | ICD-10-CM | POA: Diagnosis not present

## 2020-10-25 NOTE — Progress Notes (Signed)
HPI:      [name] is a 35 y.o. [phone] who LMP was Patient's last menstrual period was 09/12/2020.  Subjective:   She presents today at approximately 6 weeks estimated gestational age.  She is visiting OB practices in [place] to try to decide which doctors she would like to go to see. This pregnancy is a result of home artificial insemination. Patient has had previous pregnancies.  2 of her pregnancies were complicated by preeclampsia and 1 by hyperemesis. She currently feels nauseated but is keeping food and liquids down without vomiting. She is taking prenatal vitamins.    Hx: The following portions of the patient's history were reviewed and updated as appropriate:             She  has a past medical history of Gastritis and Hypertension. She does not have any pertinent problems on file. She  has a past surgical history that includes Appendectomy and Cholecystectomy. Her family history includes Cancer in her maternal grandmother and paternal grandmother; Diabetes in her mother; Diverticulitis in her father; Heart disease in her maternal grandmother and paternal grandfather; Migraines in her father; Stroke in her maternal grandmother and paternal grandfather. She  reports that she has never smoked. She has never used smokeless tobacco. She reports that she does not drink alcohol and does not use drugs. She has a current medication list which includes the following prescription(s): acetaminophen, albuterol, fluticasone, and ondansetron. She is allergic to latex and penicillins.       Review of Systems:  Review of Systems  Constitutional: Denied constitutional symptoms, night sweats, recent illness, fatigue, fever, insomnia and weight loss.  Eyes: Denied eye symptoms, eye pain, photophobia, vision change and visual disturbance.  Ears/Nose/Throat/Neck: Denied ear, nose, throat or neck symptoms, hearing loss, nasal discharge, sinus congestion and sore throat.   Cardiovascular: Denied cardiovascular symptoms, arrhythmia, chest pain/pressure, edema, exercise intolerance, orthopnea and palpitations.  Respiratory: Denied pulmonary symptoms, asthma, pleuritic pain, productive sputum, cough, dyspnea and wheezing.  Gastrointestinal: Denied, gastro-esophageal reflux, melena, nausea and vomiting.  Genitourinary: Denied genitourinary symptoms including symptomatic vaginal discharge, pelvic relaxation issues, and urinary complaints.  Musculoskeletal: Denied musculoskeletal symptoms, stiffness, swelling, muscle weakness and myalgia.  Dermatologic: Denied dermatology symptoms, rash and scar.  Neurologic: Denied neurology symptoms, dizziness, headache, neck pain and syncope.  Psychiatric: Denied psychiatric symptoms, anxiety and depression.  Endocrine: Denied endocrine symptoms including hot flashes and night sweats.   Meds:   Current Outpatient Medications on File Prior to Visit  Medication Sig Dispense Refill  . acetaminophen (TYLENOL) 500 MG tablet Take 500 mg by mouth every 6 (six) hours as needed.    Marland Kitchen albuterol (VENTOLIN HFA) 108 (90 Base) MCG/ACT inhaler Inhale 2 puffs into the lungs every 4 (four) hours as needed for wheezing or shortness of breath. (Patient not taking: No sig reported) 18 g 0  . fluticasone (FLONASE) 50 MCG/ACT nasal spray Place 1 spray into both nostrils 2 (two) times daily. (Patient not taking: No sig reported) 16 g 0  . ondansetron (ZOFRAN ODT) 4 MG disintegrating tablet Take 1 tablet (4 mg total) by mouth every 6 (six) hours as needed for nausea or vomiting. (Patient not taking: No sig reported) 10 tablet 0   No current facility-administered medications on file prior to visit.          Objective:     Vitals:   10/25/20 0909  BP: 123/82  Pulse: 98   Filed Weights   10/25/20 0909  Weight: 209 lb 12.8 oz (95.2 kg)                Assessment:    [id number] Patient Active Problem List   Diagnosis Date Noted  . Positive  RPR test 06/24/2017  . 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 Glaucoma 07/30/2016     1. Encounter to establish care     Patient presents today to have her questions answered and to explore different practices for obstetric care.   Plan:            Prenatal Plan 1.  The patient was given prenatal literature. 2.  She was continued on prenatal vitamins. 3.  A prenatal lab panel to be drawn at nurse visit. 4.  An ultrasound will be ordered once patient has decided upon a practice. 5.  A nurse visit was scheduled. 6.  Genetic testing and testing for other inheritable conditions discussed in detail. She will decide in the future whether to have these labs performed. 7.  A general overview of pregnancy testing, visit schedule, ultrasound schedule, and prenatal care was discussed. 8.  Discussed use of aspirin after 12 weeks to prevent preeclampsia.  Orders No orders of the defined types were placed in this encounter.   No orders of the defined types were placed in this encounter.     F/U  Return in about 6 weeks (around 12/06/2020). I spent 31 minutes involved in the care of this patient preparing to see the patient by obtaining and reviewing her medical history (including labs, imaging tests and prior procedures), documenting clinical information in the electronic health record (EHR), counseling and coordinating care plans, writing and sending prescriptions, ordering tests or procedures and directly communicating with the patient by discussing pertinent items from her history and physical exam as well as detailing my assessment and plan as noted above so that she has an informed understanding.  All of her questions were answered.  Elonda Husky, M.D. 10/25/2020 9:43 AM

## 2020-10-27 ENCOUNTER — Other Ambulatory Visit: Payer: Self-pay

## 2020-10-27 ENCOUNTER — Encounter: Payer: Self-pay | Admitting: Obstetrics and Gynecology

## 2020-10-27 ENCOUNTER — Ambulatory Visit (INDEPENDENT_AMBULATORY_CARE_PROVIDER_SITE_OTHER): Payer: Medicaid Other | Admitting: Obstetrics and Gynecology

## 2020-10-27 VITALS — BP 124/74 | Ht 62.0 in | Wt 211.0 lb

## 2020-10-27 DIAGNOSIS — Z3A01 Less than 8 weeks gestation of pregnancy: Secondary | ICD-10-CM | POA: Diagnosis not present

## 2020-10-27 DIAGNOSIS — O3680X0 Pregnancy with inconclusive fetal viability, not applicable or unspecified: Secondary | ICD-10-CM | POA: Diagnosis not present

## 2020-11-01 ENCOUNTER — Encounter: Payer: Self-pay | Admitting: Obstetrics and Gynecology

## 2020-11-01 NOTE — Progress Notes (Addendum)
ULTRASOUND REPORT  Location: [facility] Date of Service: 10/27/2020   Indications:Unsure LMP Findings:  Mason Jim intrauterine pregnancy is visualized with a CRL consistent with [redacted]w[redacted]d gestation, giving an (U/S) EDD of 06/21/2021. The (U/S) EDD is consistent with the clinically established EDD of 06/19/2021.  FHR: 122 BPM CRL measurement: 4.3 mm Yolk sac is visualized and appears normal. Amnion: not visualized   Right Ovary is normal in appearance. Left Ovary is normal appearance. Corpus luteal cyst:  Left ovary Survey of the adnexa demonstrates no adnexal masses. There is no free peritoneal fluid in the cul de sac.  Impression: 1. [redacted]w[redacted]d Viable Singleton Intrauterine pregnancy by U/S. 2. (U/S) EDD is consistent with Clinically established EDD of 06/19/2021.  There is a viable singleton gestation.  Detailed evaluation of the fetal anatomy is precluded by early gestational age.  It must be noted that a normal ultrasound particular at this early gestational age is unable to rule out fetal aneuploidy, risk of first trimester miscarriage, or anatomic birth defects. I personally performed the ultrasound and the interpretation of the ultrasound.   A female chaperone was present during the ultrasound since the exam was transvaginal.    Thomasene Mohair, MD, [facility], [facility] 11/01/2020 5:33 PM

## 2020-11-06 ENCOUNTER — Other Ambulatory Visit (HOSPITAL_COMMUNITY)
Admission: RE | Admit: 2020-11-06 | Discharge: 2020-11-06 | Disposition: A | Discharge status: Home / Self Care | Payer: Medicaid Other | Source: Ambulatory Visit | Attending: Advanced Practice Midwife | Admitting: Advanced Practice Midwife

## 2020-11-06 ENCOUNTER — Ambulatory Visit (INDEPENDENT_AMBULATORY_CARE_PROVIDER_SITE_OTHER): Payer: Medicaid Other | Admitting: Advanced Practice Midwife

## 2020-11-06 ENCOUNTER — Encounter: Payer: Self-pay | Admitting: Advanced Practice Midwife

## 2020-11-06 ENCOUNTER — Other Ambulatory Visit: Payer: Self-pay

## 2020-11-06 VITALS — BP 110/70 | Wt 214.0 lb

## 2020-11-06 DIAGNOSIS — O0991 Supervision of high risk pregnancy, unspecified, first trimester: Secondary | ICD-10-CM | POA: Insufficient documentation

## 2020-11-06 DIAGNOSIS — Z113 Encounter for screening for infections with a predominantly sexual mode of transmission: Secondary | ICD-10-CM | POA: Insufficient documentation

## 2020-11-06 DIAGNOSIS — Z369 Encounter for antenatal screening, unspecified: Secondary | ICD-10-CM | POA: Insufficient documentation

## 2020-11-06 DIAGNOSIS — Z3A01 Less than 8 weeks gestation of pregnancy: Secondary | ICD-10-CM

## 2020-11-06 DIAGNOSIS — O99211 Obesity complicating pregnancy, first trimester: Secondary | ICD-10-CM

## 2020-11-06 DIAGNOSIS — O0990 Supervision of high risk pregnancy, unspecified, unspecified trimester: Secondary | ICD-10-CM | POA: Insufficient documentation

## 2020-11-06 DIAGNOSIS — Z1159 Encounter for screening for other viral diseases: Secondary | ICD-10-CM

## 2020-11-06 DIAGNOSIS — Z131 Encounter for screening for diabetes mellitus: Secondary | ICD-10-CM

## 2020-11-06 DIAGNOSIS — O219 Vomiting of pregnancy, unspecified: Secondary | ICD-10-CM

## 2020-11-06 DIAGNOSIS — Z130 Encounter for screening for diseases of the blood and blood-forming organs and certain disorders involving the immune mechanism: Secondary | ICD-10-CM

## 2020-11-06 DIAGNOSIS — O09299 Supervision of pregnancy with other poor reproductive or obstetric history, unspecified trimester: Secondary | ICD-10-CM

## 2020-11-06 DIAGNOSIS — O99213 Obesity complicating pregnancy, third trimester: Secondary | ICD-10-CM | POA: Insufficient documentation

## 2020-11-06 LAB — OB RESULTS CONSOLE VARICELLA ZOSTER ANTIBODY, IGG: Varicella: IMMUNE

## 2020-11-06 LAB — OB RESULTS CONSOLE GC/CHLAMYDIA: Gonorrhea: NEGATIVE

## 2020-11-06 MED ORDER — ONDANSETRON 4 MG PO TBDP
4.0000 mg | ORAL_TABLET | Freq: Four times a day (QID) | ORAL | 2 refills | Status: DC | PRN
Start: 2020-11-06 — End: 2020-11-07

## 2020-11-06 NOTE — Patient Instructions (Signed)
Hypertension During Pregnancy Hypertension is also called high blood pressure. High blood pressure means that the force of the blood moving in your body is high enough to cause problems for you and your baby. Different types of high blood pressure can happen during pregnancy. The types are:  High blood pressure before you got pregnant. This is called chronic hypertension.  This can continue during your pregnancy. Your doctor will want to keep checking your blood pressure. You may need medicine to control your blood pressure while you are pregnant. You will need follow-up visits after you have your baby.  High blood pressure that goes up during pregnancy when it was normal before. This is called gestational hypertension. It will often get better after you have your baby, but your doctor will need to watch your blood pressure to make sure that it is getting better.  You may develop high blood pressure after giving birth. This is called postpartum hypertension. This often occurs within 48 hours after childbirth but may occur up to 6 weeks after giving birth. Very high blood pressure during pregnancy is an emergency that needs treatment right away. How does this affect me? If you have high blood pressure during pregnancy, you have a higher chance of developing high blood pressure:  As you get older.  If you get pregnant again. In some cases, high blood pressure during pregnancy can cause:  Stroke.  Heart attack.  Damage to the kidneys, lungs, or liver.  Preeclampsia.  HELLP syndrome.  Seizures.  Problems with the placenta. How does this affect my baby? Your baby may:  Be born early.  Not weigh as much as he or she should.  Not handle labor well, leading to a C-section. This condition may also result in a baby's death before birth (stillbirth). What are the risks?  Having high blood pressure during a past pregnancy.  Being overweight.  Being age 35 or older.  Being pregnant  for the first time.  Being pregnant with more than one baby.  Becoming pregnant using fertility methods, such as IVF.  Having other problems, such as diabetes or kidney disease. What can I do to lower my risk?  Keep a healthy weight.  Eat a healthy diet.  Follow what your doctor tells you about treating any medical problems that you had before you got pregnant. It is very important to go to all of your doctor visits. Your doctor will check your blood pressure and make sure that your pregnancy is progressing as it should. Treatment should start early if a problem is found.   How is this treated? Treatment for high blood pressure during pregnancy can vary. It depends on the type of high blood pressure you have and how serious it is.  If you were taking medicine for your blood pressure before you got pregnant, talk with your doctor. You may need to change the medicine during pregnancy if it is not safe for your baby.  If your blood pressure goes up during pregnancy, your doctor may order medicine to treat this.  If you are at risk for preeclampsia, your doctor may tell you to take a low-dose aspirin while you are pregnant.  If you have very high blood pressure, you may need to stay in the hospital so you and your baby can be watched closely. You may also need to take medicine to lower your blood pressure.  In some cases, if your condition gets worse, you may need to have your baby early.   Follow these instructions at home: Eating and drinking  Drink enough fluid to keep your pee (urine) pale yellow.  Avoid caffeine.   Lifestyle  Do not smoke or use any products that contain nicotine or tobacco. If you need help quitting, ask your doctor.  Do not use alcohol or drugs.  Avoid stress.  Rest and get plenty of sleep.  Regular exercise can help. Ask your doctor what kinds of exercise are best for you. General instructions  Take over-the-counter and prescription medicines only as  told by your doctor.  Keep all prenatal and follow-up visits. Contact a doctor if:  You have symptoms that your doctor told you to watch for, such as: ? Headaches. ? A feeling like you may vomit (nausea). ? Vomiting. ? Belly (abdominal) pain. ? Feeling dizzy or light-headed. Get help right away if:  You have symptoms of serious problems, such as: ? Very bad belly pain that does not get better with treatment. ? A very bad headache that does not get better. ? Blurry vision. ? Double vision. ? Vomiting that does not get better. ? Sudden, fast weight gain. ? Sudden swelling in your hands, ankles, or face. ? Bleeding from your vagina. ? Blood in your pee. ? Shortness of breath. ? Chest pain. ? Weakness on one side of your body. ? Trouble talking.  Your baby is not moving as much as usual. These symptoms may be an emergency. Get help right away. Call your local emergency services (911 in the [place]  Do not wait to see if the symptoms will go away.  Do not drive yourself to the hospital. Summary  High blood pressure is also called hypertension.  High blood pressure means that the force of the blood moving in your body is high enough to cause problems for you and your baby.  Get help right away if you have symptoms of serious problems due to high blood pressure.  Keep all prenatal and follow-up visits. This information is not intended to replace advice given to you by your health care provider. Make sure you discuss any questions you have with your health care provider. Document Revised: 03/30/2020 Document Reviewed: 03/30/2020 Elsevier Patient Education  2021 [employer] Exercise During Pregnancy Exercise is an important part of being healthy for people of all ages. Exercise improves the function of your heart and lungs and helps you maintain strength, flexibility, and a healthy body weight. Exercise also boosts energy levels and elevates mood. Most women should exercise  regularly during pregnancy. Exercise routines may need to change as your pregnancy progresses. In rare cases, women with certain medical conditions or complications may be asked to limit or avoid exercise during pregnancy. Your health care provider will give you information on what will work for you. How does this affect me? Along with maintaining general strength and flexibility, exercising during pregnancy can help:  Keep strength in muscles that are used during labor and childbirth.  Decrease low back pain or symptoms of depression.  Control weight gain during pregnancy.  Reduce the risk of needing insulin if you develop diabetes during pregnancy.  Decrease the risk of cesarean delivery.  Speed up your recovery after giving birth.  Relieve constipation. How does this affect my baby? Exercise can help you have a healthy pregnancy. Exercise does not cause early (premature) birth. It will not cause your baby to weigh less at birth. What exercises can I do? Many exercises are safe for you to do during pregnancy. Do  a variety of exercises that safely increase your heart and breathing rates and help you build and maintain muscle strength. Do exercises exactly as told by your health care provider. You may do these exercises:  Walking.  Swimming.  Water aerobics.  Riding a stationary bike.  Modified yoga or Pilates. Tell your instructor that you are pregnant. Avoid overstretching, and avoid lying on your back for long periods of time.  Running or jogging. Choose this type of exercise only if: ? You ran or jogged regularly before your pregnancy. ? You can run or jog and still talk in complete sentences.   What exercises should I avoid? You may be told to limit high-intensity exercise depending on your level of fitness and whether you exercised regularly before you were pregnant. You can tell that you are exercising at a high intensity if you are breathing much harder and faster and  cannot hold a conversation while exercising. You must avoid:  Contact sports.  Activities that put you at risk for falling on or being hit in the belly, such as downhill skiing, waterskiing, surfing, rock climbing, cycling, gymnastics, and horseback riding.  Scuba diving.  Skydiving.  Hot yoga or hot Pilates. These activities take place in a room that is heated to high temperatures.  Jogging or running, unless you jogged or ran regularly before you were pregnant. While jogging or running, you should always be able to talk in full sentences. Do not run or jog so fast that you are unable to have a conversation.  Do not exercise at more than 6,000 feet above sea level (high elevation) if you are not used to exercising at high elevation. How do I exercise in a safe way?  Avoid overheating. Do not exercise in very high temperatures.  Wear loose-fitting, breathable clothes.  Avoid dehydration. Drink enough fluid before, during, and after exercise to keep your urine pale yellow.  Avoid overstretching. Because of hormone changes during pregnancy, it is easy to overstretch muscles, tendons, and ligaments.  Start slowly and ask your health care provider to recommend the types of exercise that are safe for you.  Do not exercise to lose weight.  Wear a sports bra to support your breasts.  Avoid standing still or lying flat on your back as much as you can.   Follow these instructions at home:  Exercise on most days or all days of the week. Try to exercise for 30 minutes a day, 5 days a week, unless your health care provider tells you not to.  If you actively exercised before your pregnancy and you are healthy, your health care provider may tell you to continue to do moderate-intensity to high-intensity exercise.  If you are just starting to exercise or did not exercise much before your pregnancy, your health care provider may tell you to do low-intensity to moderate-intensity  exercise. Questions to ask your health care provider  Is exercise safe for me?  What are signs that I should stop exercising?  Does my health condition mean that I should not exercise during pregnancy?  When should I avoid exercising during pregnancy? Stop exercising and contact a health care provider if: You have any unusual symptoms such as:  Mild contractions of the uterus or cramps in the abdomen.  A dizzy feeling that does not go away when you rest. Stop exercising and get help right away if: You have any unusual symptoms such as:  Sudden, severe pain in your low back or your belly.  Regular, painful contractions of your uterus.  Chest pain.  Bleeding or fluid leaking from your vagina.  Shortness of breath.  Headache.  Pain and swelling of your calves. Summary  Most women should exercise regularly throughout pregnancy. In rare cases, women with certain medical conditions or complications may be asked to limit or avoid exercise during pregnancy.  Do not exercise to lose weight during pregnancy.  Your health care provider will tell you what level of physical activity is right for you.  Stop exercising and contact a health care provider if you have unusual symptoms, such as mild contractions or dizziness. This information is not intended to replace advice given to you by your health care provider. Make sure you discuss any questions you have with your health care provider. Document Revised: 02/23/2020 Document Reviewed: 02/23/2020 Elsevier Patient Education  2021 [employer]. Eating Plan for Pregnant Women While you are pregnant, your body requires additional nutrition to help support your growing baby. You also have a higher need for some vitamins and minerals, such as folic acid, calcium, iron, and vitamin D. Eating a healthy, well-balanced diet is very important for your health and your baby's health. Your need for extra calories varies over the course of your  pregnancy. Pregnancy is divided into three trimesters, with each trimester lasting 3 months. For most women, it is recommended to consume:  150 extra calories a day during the first trimester.  300 extra calories a day during the second trimester.  300 extra calories a day during the third trimester. What are tips for following this plan? Cooking  Practice good food safety and cleanliness. Wash your hands before you eat and after you prepare raw meat. Wash all fruits and vegetables well before peeling or eating. Taking these actions can help to prevent foodborne illnesses that can be very dangerous to your baby, such as listeriosis. Ask your health care provider for more information about listeriosis.  Make sure that all meats, poultry, and eggs are cooked to food-safe temperatures or "well-done." Meal planning  Eat a variety of foods (especially fruits and vegetables) to get a full range of vitamins and minerals.  Two or more servings of fish are recommended each week in order to get the most benefits from omega-3 fatty acids that are found in seafood. Choose fish that are lower in mercury, such as salmon and pollock.  Limit your overall intake of foods that have "empty calories." These are foods that have little nutritional value, such as sweets, desserts, candies, and sugar-sweetened beverages.  Drinks that contain caffeine are okay to drink, but it is better to avoid caffeine. Keep your total caffeine intake to less than 200 mg each day (which is 12 oz or 355 mL of coffee, tea, or soda) or the limit as told by your health care provider.   General information  Do not try to lose weight or go on a diet during pregnancy.  Take a prenatal vitamin to help meet your additional vitamin and mineral needs during pregnancy, specifically for folic acid, iron, calcium, and vitamin D.  Remember to stay active. Ask your health care provider what types of exercise and activities are safe for  you. What does 150 extra calories look like? Healthy options that provide 150 extra calories each day could be any of the following:  6-8 oz (170-227 g) plain low-fat yogurt with  cup (70 g) berries.  1 apple with 2 tsp (11 g) peanut butter.  Cut-up vegetables with  cup (60 g) hummus.  8 fl oz (237 mL) low-fat chocolate milk.  1 stick of string cheese with 1 medium orange.  1 peanut butter and jelly sandwich that is made with one slice of whole-wheat bread and 1 tsp (5 g) of peanut butter. For 300 extra calories, you could eat two of these healthy options each day. What is a healthy amount of weight to gain? The right amount of weight gain for you is based on your BMI (body mass index) before you became pregnant.  If your BMI was less than 18 (underweight), you should gain 28-40 lb (13-18 kg).  If your BMI was 18-24.9 (normal), you should gain 25-35 lb (11-16 kg).  If your BMI was 25-29.9 (overweight), you should gain 15-25 lb (7-11 kg).  If your BMI was 30 or greater (obese), you should gain 11-20 lb (5-9 kg). What if I am having twins or multiples? Generally, if you are carrying twins or multiples:  You may need to eat 300-600 extra calories a day.  The recommended range for total weight gain is 25-54 lb (11-25 kg), depending on your BMI before pregnancy.  Talk with your health care provider to find out about nutritional needs, weight gain, and exercise that is right for you. What foods should I eat? Fruits All fruits. Eat a variety of colors and types of fruit. Remember to wash your fruits well before peeling or eating. Vegetables All vegetables. Eat a variety of colors and types of vegetables. Remember to wash your vegetables well before peeling or eating. Grains All grains. Choose whole grains, such as whole-wheat bread, oatmeal, or brown rice. Meats and other protein foods Lean meats, including chicken, [place], and lean cuts of beef, veal, or pork. Fish that is  higher in omega-3 fatty acids and lower in mercury, such as salmon, herring, mussels, trout, sardines, pollock, shrimp, crab, and lobster. Tofu. Tempeh. Beans. Eggs. Peanut butter and other nut butters. Dairy Pasteurized milk and milk alternatives, such as almond milk. Pasteurized yogurt and pasteurized cheese. Cottage cheese. Sour cream. Beverages Water. Juices that contain 100% fruit juice or vegetable juice. Caffeine-free teas and decaffeinated coffee. Fats and oils Fats and oils are okay to include in moderation. Sweets and desserts Sweets and desserts are okay to include in moderation. Seasoning and other foods All pasteurized condiments. The items listed above may not be a complete list of foods and beverages you can eat. Contact a dietitian for more information.   What foods should I avoid? Fruits Raw (unpasteurized) fruit juices. Vegetables Unpasteurized vegetable juices. Meats and other protein foods Precooked or cured meat, such as bologna, hot dogs, sausages, or meat loaves. (If you must eat those meats, reheat them until they are steaming hot.) Refrigerated pate, meat spreads from a meat counter, or smoked seafood that is found in the refrigerated section of a store. Raw or undercooked meats, poultry, and eggs. Raw fish, such as sushi or sashimi. Fish that have high mercury content, such as tilefish, shark, swordfish, and king mackerel. Dairy Unpasteurized milk and any foods that have unpasteurized milk in them. Soft cheeses, such as feta, queso blanco, queso fresco, [place], [place], panela, and blue-veined cheeses (unless they are made with pasteurized milk, which must be stated on the label). Beverages Alcohol. Sugar-sweetened beverages, such as sodas, teas, or energy drinks. Seasoning and other foods Homemade fermented foods and drinks, such as pickles, sauerkraut, or kombucha drinks. (Store-bought pasteurized versions of these are okay.) Salads that are made in a  store or  deli, such as ham salad, chicken salad, egg salad, tuna salad, and seafood salad. The items listed above may not be a complete list of foods and beverages you should avoid. Contact a dietitian for more information. Where to find more information To calculate the number of calories you need based on your height, weight, and activity level, you can use an online calculator such as:  PayStrike[employer]dk To calculate how much weight you should gain during pregnancy, you can use an online pregnancy weight gain calculator such as:  http://www.harvey.com/ To learn more about eating fish during pregnancy, talk with your health care provider or visit:  PumpkinSearch.com.ee Summary  While you are pregnant, your body requires additional nutrition to help support your growing baby.  Eat a variety of foods, especially fruits and vegetables, to get a full range of vitamins and minerals.  Practice good food safety and cleanliness. Wash your hands before you eat and after you prepare raw meat. Wash all fruits and vegetables well before peeling or eating. Taking these actions can help to prevent foodborne illnesses, such as listeriosis, that can be very dangerous to your baby.  Do not eat raw meat or fish. Do not eat fish that have high mercury content, such as tilefish, shark, swordfish, and king mackerel. Do not eat raw (unpasteurized) dairy.  Take a prenatal vitamin to help meet your additional vitamin and mineral needs during pregnancy, specifically for folic acid, iron, calcium, and vitamin D. This information is not intended to replace advice given to you by your health care provider. Make sure you discuss any questions you have with your health care provider. Document Revised: 02/03/2020 Document Reviewed: 02/03/2020 Elsevier Patient Education  2021 Elsevier Inc. https://www.acog.org/womens-health/faqs/prenatal-genetic-screening-tests">  Prenatal Care Prenatal care is health care during pregnancy. It  helps you and your unborn baby (fetus) stay as healthy as possible. Prenatal care may be provided by a midwife, a family practice doctor, a Dispensing optician (nurse practitioner or physician assistant), or a childbirth and pregnancy doctor (obstetrician). How does this affect me? During pregnancy, you will be closely monitored for any new conditions that might develop. To lower your risk of pregnancy complications, you and your health care provider will talk about any underlying conditions you have. How does this affect my baby? Early and consistent prenatal care increases the chance that your baby will be healthy during pregnancy. Prenatal care lowers the risk that your baby will be:  Born early (prematurely).  Smaller than expected at birth (small for gestational age). What can I expect at the first prenatal care visit? Your first prenatal care visit will likely be the longest. You should schedule your first prenatal care visit as soon as you know that you are pregnant. Your first visit is a good time to talk about any questions or concerns you have about pregnancy. Medical history At your visit, you and your health care provider will talk about your medical history, including:  Any past pregnancies.  Your family's medical history.  Medical history of the baby's father.  Any long-term (chronic) health conditions you have and how you manage them.  Any surgeries or procedures you have had.  Any current over-the-counter or prescription medicines, herbs, or supplements that you are taking.  Other factors that could pose a risk to your baby, including: ? Exposure to harmful chemicals or radiation at work or at home. ? Any substance use, including tobacco, alcohol, and drug use.  Your home setting and your stress levels, including: ?  Exposure to abuse or violence. ? Household financial strain.  Your daily health habits, including diet and exercise. Tests and screenings Your  health care provider will:  Measure your weight, height, and blood pressure.  Do a physical exam, including a pelvic and breast exam.  Perform blood tests and urine tests to check for: ? Urinary tract infection. ? Sexually transmitted infections (STIs). ? Low iron levels in your blood (anemia). ? Blood type and certain proteins on red blood cells (Rh antibodies). ? Infections and immunity to viruses, such as hepatitis B and rubella. ? HIV (human immunodeficiency virus).  Discuss your options for genetic screening. Tips about staying healthy Your health care provider will also give you information about how to keep yourself and your baby healthy, including:  Nutrition and taking vitamins.  Physical activity.  How to manage pregnancy symptoms such as nausea and vomiting (morning sickness).  Infections and substances that may be harmful to your baby and how to avoid them.  Food safety.  Dental care.  Working.  Travel.  Warning signs to watch for and when to call your health care provider. How often will I have prenatal care visits? After your first prenatal care visit, you will have regular visits throughout your pregnancy. The visit schedule is often as follows:  Up to week 28 of pregnancy: once every 4 weeks.  28-36 weeks: once every 2 weeks.  After 36 weeks: every week until delivery. Some women may have visits more or less often depending on any underlying health conditions and the health of the baby. Keep all follow-up and prenatal care visits. This is important. What happens during routine prenatal care visits? Your health care provider will:  Measure your weight and blood pressure.  Check for fetal heart sounds.  Measure the height of your uterus in your abdomen (fundal height). This may be measured starting around week 20 of pregnancy.  Check the position of your baby inside your uterus.  Ask questions about your diet, sleeping patterns, and whether you  can feel the baby move.  Review warning signs to watch for and signs of labor.  Ask about any pregnancy symptoms you are having and how you are dealing with them. Symptoms may include: ? Headaches. ? Nausea and vomiting. ? Vaginal discharge. ? Swelling. ? Fatigue. ? Constipation. ? Changes in your vision. ? Feeling persistently sad or anxious. ? Any discomfort, including back or pelvic pain. ? Bleeding or spotting. Make a list of questions to ask your health care provider at your routine visits.   What tests might I have during prenatal care visits? You may have blood, urine, and imaging tests throughout your pregnancy, such as:  Urine tests to check for glucose, protein, or signs of infection.  Glucose tests to check for a form of diabetes that can develop during pregnancy (gestational diabetes mellitus). This is usually done around week 24 of pregnancy.  Ultrasounds to check your baby's growth and development, to check for birth defects, and to check your baby's well-being. These can also help to decide when you should deliver your baby.  A test to check for group B strep (GBS) infection. This is usually done around week 36 of pregnancy.  Genetic testing. This may include blood, fluid, or tissue sampling, or imaging tests, such as an ultrasound. Some genetic tests are done during the first trimester and some are done during the second trimester. What else can I expect during prenatal care visits? Your health care provider may  recommend getting certain vaccines during pregnancy. These may include:  A yearly flu shot (annual influenza vaccine). This is especially important if you will be pregnant during flu season.  Tdap (tetanus, diphtheria, pertussis) vaccine. Getting this vaccine during pregnancy can protect your baby from whooping cough (pertussis) after birth. This vaccine may be recommended between weeks 27 and 36 of pregnancy.  A COVID-19 vaccine. Later in your pregnancy,  your health care provider may give you information about:  Childbirth and breastfeeding classes.  Choosing a health care provider for your baby.  Umbilical cord banking.  Breastfeeding.  Birth control after your baby is born.  The hospital labor and delivery unit and how to set up a tour.  Registering at the hospital before you go into labor. Where to find more information  Office on Women's Health: TravelLesson.ca  American Pregnancy Association: americanpregnancy.org  March of Dimes: marchofdimes.org Summary  Prenatal care helps you and your baby stay as healthy as possible during pregnancy.  Your first prenatal care visit will most likely be the longest.  You will have visits and tests throughout your pregnancy to monitor your health and your baby's health.  Bring a list of questions to your visits to ask your health care provider.  Make sure to keep all follow-up and prenatal care visits. This information is not intended to replace advice given to you by your health care provider. Make sure you discuss any questions you have with your health care provider. Document Revised: 04/20/2020 Document Reviewed: 04/20/2020 Elsevier Patient Education  2021 [employer].

## 2020-11-07 ENCOUNTER — Telehealth: Payer: Self-pay

## 2020-11-07 ENCOUNTER — Other Ambulatory Visit: Payer: Self-pay | Admitting: Advanced Practice Midwife

## 2020-11-07 DIAGNOSIS — O219 Vomiting of pregnancy, unspecified: Secondary | ICD-10-CM

## 2020-11-07 MED ORDER — ONDANSETRON HCL 4 MG PO TABS: 4.0000 mg | ORAL_TABLET | Freq: Three times a day (TID) | ORAL | 2 refills | Status: DC | PRN

## 2020-11-07 NOTE — Telephone Encounter (Signed)
Mariah Richardson from [employer] for new Rx of Ondansetron(Zofran ODT) 4mg  to be sent in. Rx sent for ondansetron (ZOFRAN ODT) 4 MG disintegrating tablet  Is not covered by patient's insurance.   Please send in Zofran 4mg  regular as that is covered. Cb# [phone]. Drl,cma(aama)

## 2020-11-07 NOTE — Telephone Encounter (Signed)
Zofran reordered that should be covered by insurance.

## 2020-11-07 NOTE — Progress Notes (Signed)
New Obstetric Patient H&P  Date of Service: 11/06/2020  Chief Complaint: "Desires prenatal care"   History of Present Illness: Patient is a 35 y.o. [phone] Not Hispanic or Latino female, presents with amenorrhea and positive home pregnancy test. Patient's last menstrual period was 09/12/2020. and based on her  LMP, her EDD is Estimated Date of Delivery: 06/19/21 and her EGA is [redacted]w[redacted]d. Cycles are 6-7 days, regular, and occur approximately every : 28 days. Her last pap smear was 1 years ago and was no abnormalities.    She had a urine pregnancy test which was positive 3 or 4 week(s)  ago. Her last menstrual period was normal and lasted for  6 or 7 day(s). Since her LMP she claims she has experienced breast tenderness, fatigue, nausea, vomiting. She denies vaginal bleeding. Her past medical history is noncontributory. Her prior pregnancies are notable for 3 FT SVDs, 2007, 2013, 2019, preeclampsia G1 and G3, hypertension G2, pelvis proven to 7#10oz  Since her LMP, she admits to the use of tobacco products  no She claims she has gained 5 pounds since the start of her pregnancy.  There are cats in the home in the home  no  She admits close contact with children on a regular basis  yes  She has had chicken pox in the past yes She has had Tuberculosis exposures, symptoms, or previously tested positive for TB   no Current or past history of domestic violence. no  Genetic Screening/Teratology Counseling: (Includes patient, baby's father, or anyone in either family with:)   1. Patient's age >/= 53 at [facility]  no 2. Thalassemia ([place], [place], Mediterranean, or Asian background): MCV<80  no 3. Neural tube defect (meningomyelocele, spina bifida, anencephaly)  no 4. Congenital heart defect  no  5. Down syndrome  no 6. Tay-Sachs (Jewish, [place]  no 7. Canavan's Disease  no 8. Sickle cell disease or trait (African)  no  9. Hemophilia or other blood disorders  no  10. Muscular dystrophy  no  11.  Cystic fibrosis  no  12. Huntington's Chorea  no  13. Mental retardation/autism  no 14. Other inherited genetic or chromosomal disorder  no 15. Maternal metabolic disorder (DM, PKU, etc)  no 16. Patient or FOB with a child with a birth defect not listed above no  16a. Patient or FOB with a birth defect themselves no 17. Recurrent pregnancy loss, or stillbirth  no  18. Any medications since LMP other than prenatal vitamins (include vitamins, supplements, OTC meds, drugs, alcohol)  no 19. Any other genetic/environmental exposure to discuss  no  Infection History:   1. Lives with someone with TB or TB exposed  no  2. Patient or partner has history of genital herpes  no 3. Rash or viral illness since LMP  no 4. History of STI (GC, CT, HPV, syphilis, HIV)  Yes remote hx chlamydia 5. History of recent travel :  no  Other pertinent information:  Patient self inseminated with banked sperm   Review of Systems:10 point review of systems negative unless otherwise noted in HPI  Past Medical History:  Patient Active Problem List   Diagnosis Date Noted  . Supervision of high risk pregnancy in first trimester 11/06/2020    Clinic [facility] Prenatal Labs  Dating By LMP c/w 6w u/s Blood type: --/--/B POS (03/26 3220)   Genetic Screen 1 Screen:    AFP:     Quad:     NIPS: Antibody:POS (03/26 2542)  Anatomic  US  Rubella:    Varicella: @VZVIGG @  GTT Early: Hgb A1C              Third trimester:  RPR:     Rhogam  HBsAg:     Vaccines TDAP:                       Flu Shot: Covid:  HIV:     Baby Food Plans breast                               GBS:   GC/CT:  Contraception  Pap: 2021 negative  CBB     CS/VBAC NA Self inseminated with banked sperm  Support Person Parents: Luster LandsbergRenee and Joe       . Obesity affecting pregnancy in first trimester 11/06/2020  . Chronic migraine 07/30/2019    Last Assessment & Plan:  Formatting of this note might be different from the original. Reports a h/o chronic  migraines with auras. Has been seen by neurology since 2007 but stopped going since multiple treatments did not work. She continues to have almost daily headaches so we discussed prophylaxis since she is taking tylenol/BC powders only for pain relief. Normal neurologic exam today. We reviewed various headache treatments.  - recommended daily magnesium and riboflavin supplementation - she is using meditation/mindfulness apps and getting aerobic physical activity at home (continued to encourage this) - she would like to start amitriptyline 25mg  nightly for migraine/headache prophylaxis (we also discussed topamax given weight loss benefit as well) - virtual f/u in 1 month   - recommended eye exam as well since she hasn't had this done for many years   . History of marijuana use 06/30/2017    Formatting of this note might be different from the original. u tox q trimester and upon admission   Last Assessment & Plan:  Formatting of this note might be different from the original. Denies current use.  - Utox negative today - Obtain Utox in 3rd trimester   . Anxiety 10/03/2015    Formatting of this note might be different from the original. [ ]  EPDS qtrimester  [x]  2nd trimester  [ ]  3rd trimester  Last Assessment & Plan:  Formatting of this note might be different from the original. EDPS perfroemd in clinic today and score was 0. Patient reports good control of symptoms overall and stable mood.  - Continue to monitor and repeat EPDS in 3rd trimester     Past Surgical History:  Past Surgical History:  Procedure Laterality Date  . APPENDECTOMY    . CHOLECYSTECTOMY      Gynecologic History: Patient's last menstrual period was 09/12/2020.  Obstetric History: [id number]  Family History:  Family History  Problem Relation Age of Onset  . Diabetes Mother   . Migraines Father   . Diverticulitis Father   . Cancer Maternal Grandmother   . Stroke Maternal Grandmother   . Heart disease  Maternal Grandmother   . Cancer Paternal Grandmother   . Heart disease Paternal Grandfather   . Stroke Paternal Grandfather     Social History:  Social History   Socioeconomic History  . Marital status: Single    Spouse name: Not on file  . Number of children: Not on file  . Years of education: Not on file  . Highest education level: Not on file  Occupational History  . Not on file  Tobacco  Use  . Smoking status: Never Smoker  . Smokeless tobacco: Never Used  Vaping Use  . Vaping Use: Never used  Substance and Sexual Activity  . Alcohol use: No  . Drug use: No  . Sexual activity: Yes    Birth control/protection: None  Other Topics Concern  . Not on file  Social History Narrative  . Not on file   Social Determinants of Health   Financial Resource Strain: Not on file  Food Insecurity: Not on file  Transportation Needs: Not on file  Physical Activity: Not on file  Stress: Not on file  Social Connections: Not on file  Intimate Partner Violence: Not on file    Allergies:  Allergies  Allergen Reactions  . Latex   . Penicillins     Medications: Prior to Admission medications   Medication Sig Start Date End Date Taking? Authorizing Provider  acetaminophen (TYLENOL) 500 MG tablet Take 500 mg by mouth every 6 (six) hours as needed.   Yes [provider]  ondansetron (ZOFRAN ODT) 4 MG disintegrating tablet Take 1 tablet (4 mg total) by mouth every 6 (six) hours as needed for nausea or vomiting. 11/06/20   Tresea Mall, CNM    Physical Exam Vitals: Blood pressure 110/70, weight 214 lb (97.1 kg), last menstrual period 09/12/2020.  General: NAD HEENT: normocephalic, anicteric Thyroid: no enlargement, no palpable nodules Pulmonary: No increased work of breathing, CTAB Cardiovascular: RRR, distal pulses 2+ Abdomen: NABS, soft, non-tender, non-distended.  Umbilicus without lesions.  No hepatomegaly, splenomegaly or masses palpable. No evidence of hernia   Genitourinary:  External: Normal external female genitalia.  Normal urethral meatus, normal Bartholin's and Skene's glands.    Vagina: Normal vaginal mucosa, no evidence of prolapse.    Cervix: Grossly normal in appearance, no bleeding, no CMT  Uterus: Non-enlarged, mobile, normal contour.    Adnexa: ovaries non-enlarged, no adnexal masses  Rectal: deferred Extremities: no edema, erythema, or tenderness Neurologic: Grossly intact Psychiatric: mood appropriate, affect full   The following were addressed during this visit:  Breastfeeding Education - Early initiation of breastfeeding    Comments: Keeps milk supply adequate, helps contract uterus and slow bleeding, and early milk is the perfect first food and is easy to digest.   - The importance of exclusive breastfeeding    Comments: Provides antibodies, Lower risk of breast and ovarian cancers, and type-2 diabetes,Helps your body recover, Reduced chance of SIDS.   - Risks of giving your baby anything other than breast milk if you are breastfeeding    Comments: Make the baby less content with breastfeeds, may make my baby more susceptible to illness, and may reduce my milk supply.   - The importance of early skin-to-skin contact    Comments: Keeps baby warm and secure, helps keep baby's blood sugar up and breathing steady, easier to bond and breastfeed, and helps calm baby.  - Rooming-in on a 24-hour basis    Comments: Easier to learn baby's feeding cues, easier to bond and get to know each other, and encourages milk production.   - Feeding on demand or baby-led feeding    Comments: Helps prevent breastfeeding complications, helps bring in good milk supply, prevents under or overfeeding, and helps baby feel content and satisfied   - Frequent feeding to help assure optimal milk production    Comments: Making a full supply of milk requires frequent removal of milk from breasts, infant will eat 8-12 times in 24 hours, if separated  from infant  use breast massage, hand expression and/ or pumping to remove milk from breasts.   - Effective positioning and attachment    Comments: Helps my baby to get enough breast milk, helps to produce an adequate milk supply, and helps prevent nipple pain and damage   - Exclusive breastfeeding for the first 6 months    Comments: Builds a healthy milk supply and keeps it up, protects baby from sickness and disease, and breastmilk has everything your baby needs for the first 6 months.  - Individualized Education    Comments: Contraindications to breastfeeding and other special medical conditions Has some breastfeeding experience   Assessment: 35 y.o. G4P3003 at [redacted]w[redacted]d by LMP presenting to initiate prenatal care  Plan: 1) Avoid alcoholic beverages. 2) Patient encouraged not to smoke.  3) Discontinue the use of all non-medicinal drugs and chemicals.  4) Take prenatal vitamins daily.  5) Nutrition, food safety (fish, cheese advisories, and high nitrite foods) and exercise discussed. 6) Hospital and practice style discussed with cross coverage system.  7) Genetic Screening, such as with 1st Trimester Screening, cell free fetal DNA, AFP testing, and Ultrasound, as well as with amniocentesis and CVS as appropriate, is discussed with patient. At the conclusion of today's visit patient requested cell free DNA genetic testing 8) Patient is asked about travel to areas at risk for the Zika virus, and counseled to avoid travel and exposure to mosquitoes or sexual partners who may have themselves been exposed to the virus. Testing is discussed, and will be ordered as appropriate.  9) Aptima, urine culture, NOB panel inc Hepatitis panel, sickle cell screen, Hgb A1C 10) Return to clinic in 4 weeks for ROB/MaterniT 21   Tresea Mall, CNM [facility] 11/07/2020, 11:58 AM

## 2020-11-07 NOTE — Progress Notes (Signed)
Rx zofran changed due to ODT not covered by insurance.

## 2020-11-08 LAB — CERVICOVAGINAL ANCILLARY ONLY
Chlamydia: NEGATIVE
Comment: NEGATIVE
Comment: NEGATIVE
Comment: NORMAL
Neisseria Gonorrhea: NEGATIVE
Trichomonas: NEGATIVE

## 2020-11-08 LAB — RPR+RH+ABO+RUB AB+AB SCR+CB...
Antibody Screen: NEGATIVE
HIV Screen 4th Generation wRfx: NONREACTIVE
Hematocrit: 39.3 % (ref 34.0–46.6)
Hemoglobin: 13.1 g/dL (ref 11.1–15.9)
Hepatitis B Surface Ag: NEGATIVE
MCH: 29.4 pg (ref 26.6–33.0)
MCHC: 33.3 g/dL (ref 31.5–35.7)
MCV: 88 fL (ref 79–97)
Platelets: 294 10*3/uL (ref 150–450)
RBC: 4.45 x10E6/uL (ref 3.77–5.28)
RDW: 13.2 % (ref 11.7–15.4)
RPR Ser Ql: REACTIVE — AB
Rh Factor: POSITIVE
Rubella Antibodies, IGG: 4.29 index (ref >0.99)
Varicella zoster IgG: 856 index (ref >165)
WBC: 9 10*3/uL (ref 3.4–10.8)

## 2020-11-08 LAB — HGB FRACTIONATION CASCADE
Hgb A2: 2.4 % (ref 1.8–3.2)
Hgb A: 97.6 % (ref 96.4–98.8)
Hgb F: 0 % (ref 0.0–2.0)
Hgb S: 0 %

## 2020-11-08 LAB — HEPATITIS PANEL, ACUTE
Hep A IgM: NEGATIVE
Hep B C IgM: NEGATIVE
Hep C Virus Ab: <0.1 s/co ratio (ref 0.0–0.9)

## 2020-11-08 LAB — RPR, QUANT+TP ABS (REFLEX)
Rapid Plasma Reagin, Quant: 1:1 {titer} — ABNORMAL HIGH
T Pallidum Abs: NONREACTIVE

## 2020-11-08 LAB — HGB A1C W/O EAG: Hgb A1c MFr Bld: 5.4 % (ref 4.8–5.6)

## 2020-11-10 LAB — URINE CULTURE

## 2020-11-12 ENCOUNTER — Emergency Department
Admission: EM | Admit: 2020-11-12 | Discharge: 2020-11-12 | Disposition: A | Discharge status: Home / Self Care | Payer: Medicaid Other | Attending: Emergency Medicine | Admitting: Emergency Medicine

## 2020-11-12 ENCOUNTER — Encounter: Payer: Self-pay | Admitting: Emergency Medicine

## 2020-11-12 ENCOUNTER — Emergency Department: Payer: Medicaid Other

## 2020-11-12 DIAGNOSIS — I10 Essential (primary) hypertension: Secondary | ICD-10-CM | POA: Insufficient documentation

## 2020-11-12 DIAGNOSIS — M545 Low back pain, unspecified: Secondary | ICD-10-CM | POA: Diagnosis not present

## 2020-11-12 DIAGNOSIS — K5900 Constipation, unspecified: Secondary | ICD-10-CM

## 2020-11-12 DIAGNOSIS — O99611 Diseases of the digestive system complicating pregnancy, first trimester: Secondary | ICD-10-CM | POA: Diagnosis not present

## 2020-11-12 DIAGNOSIS — Z3A09 9 weeks gestation of pregnancy: Secondary | ICD-10-CM | POA: Insufficient documentation

## 2020-11-12 DIAGNOSIS — Z91040 Latex allergy status: Secondary | ICD-10-CM | POA: Diagnosis not present

## 2020-11-12 LAB — URINALYSIS, COMPLETE (UACMP) WITH MICROSCOPIC
Bacteria, UA: NONE SEEN
Bilirubin Urine: NEGATIVE
Glucose, UA: NEGATIVE mg/dL
Hgb urine dipstick: NEGATIVE
Ketones, ur: NEGATIVE mg/dL
Leukocytes,Ua: NEGATIVE
Nitrite: NEGATIVE
Protein, ur: NEGATIVE mg/dL
Specific Gravity, Urine: 1.011 (ref 1.005–1.030)
pH: 6 (ref 5.0–8.0)

## 2020-11-12 LAB — PREGNANCY, URINE: Preg Test, Ur: POSITIVE — AB

## 2020-11-12 MED ORDER — METOCLOPRAMIDE HCL 10 MG PO TABS: 10.0000 mg | ORAL_TABLET | Freq: Three times a day (TID) | ORAL | 0 refills | Status: DC | PRN

## 2020-11-12 MED ORDER — ACETAMINOPHEN 500 MG PO TABS
1000.0000 mg | ORAL_TABLET | Freq: Once | ORAL | Status: AC
  Administered 2020-11-12: 1000 mg via ORAL
  Filled 2020-11-12: qty 2

## 2020-11-12 MED ORDER — DICYCLOMINE HCL 10 MG PO CAPS
20.0000 mg | ORAL_CAPSULE | Freq: Once | ORAL | Status: AC
  Administered 2020-11-12: 20 mg via ORAL
  Filled 2020-11-12: qty 2

## 2020-11-12 MED ORDER — DICYCLOMINE HCL 20 MG PO TABS: 20.0000 mg | ORAL_TABLET | Freq: Three times a day (TID) | ORAL | 0 refills | Status: DC | PRN

## 2020-11-12 NOTE — ED Triage Notes (Signed)
Pt 8-weeks pregnant with constipation x1 day. Pt reports she has been on and off the toilet trying to strain out a BM today but unsuccessful. Pt took OTC medication for relief 5 hours ago and 2 hours afterwards pt started to pass small "hard" stool in pieces but in last hours has noticed blood when wiping.

## 2020-11-12 NOTE — ED Notes (Signed)
Pt now returned from u/s via w/c -- pt remains awake and alert; no acute changes noted

## 2020-11-12 NOTE — ED Notes (Signed)
Pt agreeable with d/c plan as discussed by provider- this nurse has verbally reinforced d/c instructions and provided pt with written copy.  Pt acknowledges verbal understanding and denies any additional questions, concerns, needs- ambulatory at discharge independently with steady gait; no acute distress.

## 2020-11-12 NOTE — ED Provider Notes (Signed)
[facility] Emergency Department Provider Note  ____________________________________________   Event Date/Time   First MD Initiated Contact with Patient 11/12/20 [phone]     (approximate)  I have reviewed the triage vital signs and the nursing notes.   HISTORY  Chief Complaint Constipation    HPI Mariah Richardson is a 35 y.o. female with history of hypertension who presents to the emergency department with complaints of lower back pain, pelvic pain, constipation x 1 day.  Describes the pain as crampy in nature.  No vaginal bleeding, discharge, dysuria or hematuria.  States she has now started to have multiple bowel movements but continues to have crampy pain.  She is not concerned for STDs.  States she thinks she is approximately [redacted] weeks pregnant.  She has had a previous ultrasound confirming an IUP.        Past Medical History:  Diagnosis Date  . Gastritis   . Hypertension     Patient Active Problem List   Diagnosis Date Noted  . Supervision of high risk pregnancy in first trimester 11/06/2020  . Obesity affecting pregnancy in first trimester 11/06/2020  . Chronic migraine 07/30/2019  . History of marijuana use 06/30/2017  . Anxiety 10/03/2015    Past Surgical History:  Procedure Laterality Date  . APPENDECTOMY    . CHOLECYSTECTOMY      Prior to Admission medications   Medication Sig Start Date End Date Taking? Authorizing Provider  acetaminophen (TYLENOL) 500 MG tablet Take 500 mg by mouth every 6 (six) hours as needed.    [provider]  ondansetron (ZOFRAN) 4 MG tablet Take 1 tablet (4 mg total) by mouth every 8 (eight) hours as needed for nausea or vomiting. 11/07/20   Tresea MallGledhill, Jane, CNM    Allergies Latex and Penicillins  Family History  Problem Relation Age of Onset  . Diabetes Mother   . Migraines Father   . Diverticulitis Father   . Cancer Maternal Grandmother   . Stroke Maternal Grandmother   . Heart disease  Maternal Grandmother   . Cancer Paternal Grandmother   . Heart disease Paternal Grandfather   . Stroke Paternal Grandfather     Social History Social History   Tobacco Use  . Smoking status: Never Smoker  . Smokeless tobacco: Never Used  Vaping Use  . Vaping Use: Never used  Substance Use Topics  . Alcohol use: No  . Drug use: No    Review of Systems Constitutional: No fever. Eyes: No visual changes. ENT: No sore throat. Cardiovascular: Denies chest pain. Respiratory: Denies shortness of breath. Gastrointestinal: No nausea, vomiting, diarrhea. Genitourinary: Negative for dysuria. Musculoskeletal: + for back pain. Skin: Negative for rash. Neurological: Negative for focal weakness or numbness.  ____________________________________________   PHYSICAL EXAM:  VITAL SIGNS: ED Triage Vitals [11/12/20 0100]  Enc Vitals Group     BP 119/88     Pulse Rate 92     Resp 17     Temp 98.4 F (36.9 C)     Temp Source Oral     SpO2 98 %     Weight      Height      Head Circumference      Peak Flow      Pain Score      Pain Loc      Pain Edu?      Excl. in GC?    CONSTITUTIONAL: Alert and oriented and responds appropriately to questions. Well-appearing; well-nourished, obese HEAD:  Normocephalic EYES: Conjunctivae clear, pupils appear equal, EOM appear intact ENT: normal nose; moist mucous membranes NECK: Supple, normal ROM CARD: RRR; S1 and S2 appreciated; no murmurs, no clicks, no rubs, no gallops RESP: Normal chest excursion without splinting or tachypnea; breath sounds clear and equal bilaterally; no wheezes, no rhonchi, no rales, no hypoxia or respiratory distress, speaking full sentences ABD/GI: Normal bowel sounds; non-distended; soft, non-tender, no rebound, no guarding, no peritoneal signs, no hepatosplenomegaly GU:  Patient defers RECTAL:  Normal rectal tone, no gross blood or melena, no hemorrhoids appreciated, nontender rectal exam, no fecal impaction.  BACK:  The back appears normal EXT: Normal ROM in all joints; no deformity noted, no edema; no cyanosis SKIN: Normal color for age and race; warm; no rash on exposed skin NEURO: Moves all extremities equally PSYCH: The patient's mood and manner are appropriate.  ____________________________________________   LABS (all labs ordered are listed, but only abnormal results are displayed)  Labs Reviewed  URINALYSIS, COMPLETE (UACMP) WITH MICROSCOPIC - Abnormal; Notable for the following components:      Result Value   Color, Urine STRAW (*)    APPearance CLEAR (*)    All other components within normal limits  PREGNANCY, URINE - Abnormal; Notable for the following components:   Preg Test, Ur POSITIVE (*)    All other components within normal limits   ____________________________________________  EKG  none ____________________________________________  RADIOLOGY I, Braydee Shimkus, personally viewed and evaluated these images (plain radiographs) as part of my medical decision making, as well as reviewing the written report by the radiologist.  ED MD interpretation: Normal-appearing IUP with fetal heart rate of 158.  Official radiology report(s): US OB Comp < 14 Wks  Result Date: 11/12/2020 CLINICAL DATA:  Pelvic pain. Last menstrual period 09/12/2020. Gestational age by last menstrual period 8 weeks and 5 days. EXAM: OBSTETRIC <14 WK [place] AND TRANSVAGINAL OB US TECHNIQUE: Both transabdominal and transvaginal ultrasound examinations were performed for complete evaluation of the gestation as well as the maternal uterus, adnexal regions, and pelvic cul-de-sac. Transvaginal technique was performed to assess early pregnancy. COMPARISON:  Ultrasound Ob 10/14/2020 FINDINGS: Intrauterine gestational sac: single. Yolk sac:  Visualized. Embryo:  Visualized. Cardiac Activity: Visualized. Heart Rate: 158 bpm CRL:  21.9 mm   8 w   6  d                  [place] EDC: 06/19/2021 Subchorionic hemorrhage:  Small. Maternal  uterus/adnexae: The uterus is otherwise unremarkable. Bilateral ovaries are unremarkable with a corpus luteum cyst noted within the left ovary. Vascular flow noted to bilateral ovaries. Other: No free pelvic fluid. IMPRESSION: 1. Single live intrauterine pregnancy with a gestational age by ultrasound of 8 weeks and 6 days concordant with gestational age by last menstrual period of 8 weeks and 5 days. 2. Small subchorionic hemorrhage. Electronically Signed   By: Tish Frederickson M.D.   On: 11/12/2020 05:03   [place] Art/Ven Flow Abd Pelv Doppler  Result Date: 11/12/2020 CLINICAL DATA:  Pelvic pain. Last menstrual period 09/12/2020. Gestational age by last menstrual period 8 weeks and 5 days. EXAM: OBSTETRIC <14 WK [place] AND TRANSVAGINAL OB US TECHNIQUE: Both transabdominal and transvaginal ultrasound examinations were performed for complete evaluation of the gestation as well as the maternal uterus, adnexal regions, and pelvic cul-de-sac. Transvaginal technique was performed to assess early pregnancy. COMPARISON:  Ultrasound Ob 10/14/2020 FINDINGS: Intrauterine gestational sac: single. Yolk sac:  Visualized. Embryo:  Visualized. Cardiac Activity: Visualized. Heart  Rate: 158 bpm CRL:  21.9 mm   8 w   6  d                  [place] EDC: 06/19/2021 Subchorionic hemorrhage:  Small. Maternal uterus/adnexae: The uterus is otherwise unremarkable. Bilateral ovaries are unremarkable with a corpus luteum cyst noted within the left ovary. Vascular flow noted to bilateral ovaries. Other: No free pelvic fluid. IMPRESSION: 1. Single live intrauterine pregnancy with a gestational age by ultrasound of 8 weeks and 6 days concordant with gestational age by last menstrual period of 8 weeks and 5 days. 2. Small subchorionic hemorrhage. Electronically Signed   By: Tish Frederickson M.D.   On: 11/12/2020 05:03    ____________________________________________   PROCEDURES  Procedure(s) performed (including Critical  Care):  Procedures    ____________________________________________   INITIAL IMPRESSION / ASSESSMENT AND PLAN / ED COURSE  As part of my medical decision making, I reviewed the following data within the electronic MEDICAL RECORD NUMBER Nursing notes reviewed and incorporated, Labs reviewed , Old chart reviewed and Notes from prior ED visits         Patient here with constipation.  She has had multiple bowel movements while here in the waiting room and states she is passing gas.  No vomiting.  Abdominal exam benign.  She states she is approximately [redacted] weeks pregnant.  She is not concerned for STDs and declines testing today.  We will perform transvaginal ultrasound to ensure no acute abnormality including miscarriage, torsion, TOA.  Will give Tylenol, Bentyl for symptomatic relief.  No fecal impaction on exam.  Urine shows no sign of infection today.  ED PROGRESS  Patient's OB ultrasound shows single live intrauterine pregnancy measuring 8 weeks and 6 days with fetal heart rate of 158.  She has a small subchorionic hemorrhage.  Discussed these findings with patient.  She denies any vaginal bleeding.  She reports feeling better after Tylenol and Bentyl.  Will discharge with prescription of the same as well as Reglan for intermittent nausea.  Recommended Colace, MiraLAX over-the-counter as needed for constipation.  Also discussed that enemas are safe in pregnancy.  I feel she is safe to be discharged home.   At this time, I do not feel there is any life-threatening condition present. I have reviewed, interpreted and discussed all results (EKG, imaging, lab, urine as appropriate) and exam findings with patient/family. I have reviewed nursing notes and appropriate previous records.  I feel the patient is safe to be discharged home without further emergent workup and can continue workup as an outpatient as needed. Discussed usual and customary return precautions. Patient/family verbalize understanding  and are comfortable with this plan.  Outpatient follow-up has been provided as needed. All questions have been answered.  ____________________________________________   FINAL CLINICAL IMPRESSION(S) / ED DIAGNOSES  Final diagnoses:  Constipation, unspecified constipation type     ED Discharge Orders    None      *Please note:  Mariah Richardson was evaluated in Emergency Department on 11/12/2020 for the symptoms described in the history of present illness. She was evaluated in the context of the global COVID-19 pandemic, which necessitated consideration that the patient might be at risk for infection with the SARS-CoV-2 virus that causes COVID-19. Institutional protocols and algorithms that pertain to the evaluation of patients at risk for COVID-19 are in a state of rapid change based on information released by regulatory bodies including the CDC and federal and state organizations. These  policies and algorithms were followed during the patient's care in the ED.  Some ED evaluations and interventions may be delayed as a result of limited staffing during and the pandemic.*   Note:  This document was prepared using Dragon voice recognition software and may include unintentional dictation errors.   Kylea Berrong, Layla Maw, DO 11/12/20 [phone]

## 2020-11-12 NOTE — Discharge Instructions (Addendum)
You may take Tylenol 1000 mg every 6 hours as needed for pain.  This medication is found over-the-counter.  You may take MiraLAX 1-2 times a day, Colace 100 mg twice a day and use Fleet enemas as needed to help with constipation.  These medications are also found over-the-counter and are safe in pregnancy.  I recommend that she increase the amount of fiber in your diet.  If you are unable to eat foods high in fiber, you may use over-the-counter Metamucil or Benefiber.

## 2020-11-12 NOTE — ED Notes (Addendum)
Pt awake and alert; no acute distress. RR even and unlabored on RA.  Reports ongoing pelvic and lower back pain rated 9/10 described as cramping and pressure.  Pt able to ambulate from room to w/c outside for transport to ultrasound - pt now off the floor

## 2020-11-16 ENCOUNTER — Ambulatory Visit (INDEPENDENT_AMBULATORY_CARE_PROVIDER_SITE_OTHER): Payer: Medicaid Other | Admitting: Obstetrics

## 2020-11-16 ENCOUNTER — Other Ambulatory Visit: Payer: Self-pay

## 2020-11-16 VITALS — BP 108/64 | Wt 211.0 lb

## 2020-11-16 DIAGNOSIS — M545 Low back pain, unspecified: Secondary | ICD-10-CM

## 2020-11-16 DIAGNOSIS — O09299 Supervision of pregnancy with other poor reproductive or obstetric history, unspecified trimester: Secondary | ICD-10-CM

## 2020-11-16 DIAGNOSIS — O0991 Supervision of high risk pregnancy, unspecified, first trimester: Secondary | ICD-10-CM

## 2020-11-16 DIAGNOSIS — Z3A09 9 weeks gestation of pregnancy: Secondary | ICD-10-CM

## 2020-11-16 DIAGNOSIS — R109 Unspecified abdominal pain: Secondary | ICD-10-CM

## 2020-11-16 DIAGNOSIS — M62830 Muscle spasm of back: Secondary | ICD-10-CM

## 2020-11-16 LAB — POCT URINALYSIS DIPSTICK OB
Appearance: NORMAL
Bilirubin, UA: NEGATIVE
Blood, UA: NEGATIVE
Glucose, UA: NEGATIVE
Ketones, UA: NEGATIVE
Leukocytes, UA: NEGATIVE
Nitrite, UA: NEGATIVE
Odor: NORMAL
POC,PROTEIN,UA: NEGATIVE
Spec Grav, UA: 1.015 (ref 1.010–1.025)
Urobilinogen, UA: 0.2 E.U./dL
pH, UA: 6 (ref 5.0–8.0)

## 2020-11-16 MED ORDER — CYCLOBENZAPRINE HCL 5 MG PO TABS: 7.5000 mg | ORAL_TABLET | Freq: Three times a day (TID) | ORAL | Status: AC | PRN

## 2020-11-16 NOTE — Progress Notes (Signed)
Low back pain x6 days. Had constipation. That has resolved. Back pain has not subsided w/Tylenol/heating pad. Pain radiates to abdomen. Denies urinary symptoms.

## 2020-11-16 NOTE — Progress Notes (Signed)
Routine Prenatal Care Visit  Subjective  Mariah Richardson is a 35 y.o. [phone] at [redacted]w[redacted]d being seen today for ongoing prenatal care.  She is currently monitored for the following issues for this high-risk pregnancy and has Supervision of high risk pregnancy in first trimester; Obesity affecting pregnancy in first trimester; History of marijuana use; Chronic migraine; and Anxiety on their problem list.  ----------------------------------------------------------------------------------- Patient reports backache.  She is worked in today due to ongoing flank pain that is bilateral and has bothered her for several days. She is having difficulty sleeping. 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 The pain starts around her lower back and then radiates around to her front along the groin line.  . Vag. Bleeding: None.   . Leaking Fluid denies.  ----------------------------------------------------------------------------------- The following portions of the patient's history were reviewed and updated as appropriate: allergies, current medications, past family history, past medical history, past social history, past surgical history and problem list. Problem list updated.  Objective  Blood pressure 108/64, weight 211 lb (95.7 kg), last menstrual period 09/12/2020. Pregravid weight 209 lb (94.8 kg) Total Weight Gain 2 lb (0.907 kg) Urinalysis: Urine Protein    Urine Glucose    Fetal Status:           General:  Alert, oriented and cooperative. Patient is in no acute distress.  Skin: Skin is warm and dry. No rash noted.   Cardiovascular: Normal heart rate noted  Respiratory: Normal respiratory effort, no problems with respiration noted  Abdomen: Soft, gravid, appropriate for gestational age.       Pelvic:  Cervical exam deferred        Extremities: Normal range of motion.     Mental Status: Normal mood and affect.  Normal behavior. Normal judgment and thought content.   Assessment   35 y.o. [id number] at [redacted]w[redacted]d by  06/19/2021, by Last Menstrual Period presenting for work-in prenatal visit  Plan   pregnancy4  Problems (from 09/12/20 to present)    Problem Noted Resolved   Supervision of high risk pregnancy in first trimester 11/06/2020 by Tresea Mall, CNM No   Overview Addendum 11/16/2020  4:48 PM by Mirna Mires, CNM    [facility]inic [facility] Prenatal Labs  Dating By LMP c/w 6w u/s Blood type: --/--/B POS (03/26 0917)   Genetic Screen 1 Screen:    AFP:     Quad:     NIPS: Antibody:POS (03/26 0917)  Anatomic [place]  Rubella:   immuneNR Varicella: @VZVIGG @  GTT Early: Hgb A1C              Third trimester:  RPR:   reactive, with neg Pallidum  Rhogam  negative  Vaccines TDAP:                       Flu Shot: Covid:  NR  Baby Food Plans breast                               GBS:   GC/CT:  Contraception  Pap: 2021 negative  CBB     CS/VBAC NA   Support Person Parents: 2022 and Joe          Previous Version       Preterm labor symptoms and general obstetric precautions including but not limited to vaginal bleeding, contractions, leaking of fluid and  fetal movement were reviewed in detail with the patient. Please refer to After Visit Summary for other counseling recommendations.  Offered a trial of flexeril which is RX'ed for her. As she has noticeable bilateral CVAT, will order a renal ultrasound to r/o hydronephrosis.  Return in about 4 weeks (around 12/14/2020) for return OB.  Mirna Mires, CNM  11/16/2020 4:49 PM

## 2020-11-20 LAB — URINE CULTURE, OB REFLEX

## 2020-11-20 LAB — CULTURE, OB URINE

## 2020-11-21 ENCOUNTER — Other Ambulatory Visit: Payer: Self-pay | Admitting: Obstetrics

## 2020-11-21 MED ORDER — SULFAMETHOXAZOLE-TRIMETHOPRIM 800-160 MG PO TABS: 1.0000 | ORAL_TABLET | Freq: Two times a day (BID) | ORAL | 1 refills | Status: DC

## 2020-11-21 NOTE — Progress Notes (Signed)
Contacted patient about her urine culture which shows  a colony count of proteus mirabilis. Started her on Bactrim.

## 2020-11-27 ENCOUNTER — Other Ambulatory Visit: Payer: Self-pay | Admitting: Obstetrics

## 2020-11-27 ENCOUNTER — Telehealth: Payer: Self-pay

## 2020-11-27 DIAGNOSIS — O219 Vomiting of pregnancy, unspecified: Secondary | ICD-10-CM

## 2020-11-27 MED ORDER — PROMETHAZINE HCL 25 MG PO TABS
25.0000 mg | ORAL_TABLET | Freq: Four times a day (QID) | ORAL | 1 refills | Status: DC | PRN
Start: 2020-11-27 — End: 2021-01-01

## 2020-11-27 NOTE — Telephone Encounter (Signed)
Pt calling; zofran is not helping; for the past 7 days she can't keep anything down; can we give her anything so she is not weak and down; is trying to keep fluids down.  [phone]

## 2020-11-28 NOTE — Telephone Encounter (Signed)
Pt aware.

## 2020-12-04 ENCOUNTER — Other Ambulatory Visit: Payer: Self-pay

## 2020-12-04 ENCOUNTER — Other Ambulatory Visit: Payer: Self-pay | Admitting: Obstetrics

## 2020-12-04 ENCOUNTER — Ambulatory Visit (INDEPENDENT_AMBULATORY_CARE_PROVIDER_SITE_OTHER): Payer: Medicaid Other | Admitting: Obstetrics & Gynecology

## 2020-12-04 ENCOUNTER — Encounter: Payer: Self-pay | Admitting: Obstetrics & Gynecology

## 2020-12-04 VITALS — BP 120/80 | Wt 221.0 lb

## 2020-12-04 DIAGNOSIS — O0991 Supervision of high risk pregnancy, unspecified, first trimester: Secondary | ICD-10-CM

## 2020-12-04 DIAGNOSIS — Z3A11 11 weeks gestation of pregnancy: Secondary | ICD-10-CM

## 2020-12-04 DIAGNOSIS — O09299 Supervision of pregnancy with other poor reproductive or obstetric history, unspecified trimester: Secondary | ICD-10-CM

## 2020-12-04 DIAGNOSIS — O99211 Obesity complicating pregnancy, first trimester: Secondary | ICD-10-CM

## 2020-12-04 DIAGNOSIS — Z1379 Encounter for other screening for genetic and chromosomal anomalies: Secondary | ICD-10-CM

## 2020-12-04 MED ORDER — DOXYLAMINE-PYRIDOXINE 10-10 MG PO TBEC: 2.0000 | DELAYED_RELEASE_TABLET | Freq: Every day | ORAL | 5 refills | Status: DC

## 2020-12-04 NOTE — Progress Notes (Signed)
  Subjective  Fetal Movement? no Nausea and vomiting? Yes    Zofran, Unisom, B6, Reglan no help.  Phenergan helps some, but causes drowsiness Leaking Fluid? no Vaginal Bleeding? no  Objective  BP 120/80   Wt 221 lb (100.2 kg)   LMP 09/12/2020   BMI 40.42 kg/m  General: NAD Pumonary: no increased work of breathing Abdomen: gravid, non-tender Extremities: no edema Psychiatric: mood appropriate, affect full  Assessment  35 y.o. [id number] at [redacted]w[redacted]d by  06/19/2021, by Last Menstrual Period presenting for routine prenatal visit  Plan   Problem List Items Addressed This Visit      Other   Supervision of high risk pregnancy in first trimester    Other Visit Diagnoses    [redacted] weeks gestation of pregnancy    -  Primary   Relevant Orders   MaterniT21 PLUS Core+SCA   Encounter for genetic screening for Down Syndrome       Relevant Orders   MaterniT21 PLUS Core+SCA    Diclegis discussed, Rx NIPT today Urine for protein today PNV ASA start daily 81 mg  pregnancy4  Problems (from 09/12/20 to present)    Problem Noted Resolved   Supervision of high risk pregnancy in first trimester     Overview Addendum 12/04/2020  2:55 PM by Nadara Mustard, MD    [facility]inic [facility] Prenatal Labs  Dating By LMP c/w 6w u/s Blood type: B/Positive/-- (04/18 1453)   Genetic Screen      NIPS: Antibody:Negative (04/18 1453)  Anatomic [place]  Rubella: 4.29 (04/18 1453) immuneNR Varicella: Imm  GTT Early: Hgb A1C              Third trimester:  RPR: Reactive (04/18 1453) reactive, with neg Pallidum  Rhogam  negative  Vaccines TDAP:                       Flu Shot: Covid:  NR  Baby Food Plans breast                               GBS:   GC/CT:  Contraception  Pap: 2021 negative  CBB     CS/VBAC NA   Support Person Parents: Renee and Mike Craze, MD, [facility], [facility] 12/04/2020  3:03 PM

## 2020-12-04 NOTE — Patient Instructions (Signed)

## 2020-12-05 LAB — PROTEIN / CREATININE RATIO, URINE
Creatinine, Urine: 172 mg/dL
Protein, Ur: 14.4 mg/dL
Protein/Creat Ratio: 84 mg/g creat (ref 0–200)

## 2020-12-07 ENCOUNTER — Other Ambulatory Visit: Payer: Self-pay | Admitting: Obstetrics & Gynecology

## 2020-12-07 ENCOUNTER — Telehealth: Payer: Self-pay

## 2020-12-07 DIAGNOSIS — Z3689 Encounter for other specified antenatal screening: Secondary | ICD-10-CM

## 2020-12-07 MED ORDER — ONDANSETRON HCL 8 MG PO TABS: 8.0000 mg | ORAL_TABLET | Freq: Three times a day (TID) | ORAL | 0 refills | Status: DC | PRN

## 2020-12-07 NOTE — Telephone Encounter (Signed)
Continue taking the Diclegis 3 times a day and I will call in additional separate medicine (higher dose than before) Zofran for nausea in between these doses as needed.

## 2020-12-07 NOTE — Telephone Encounter (Signed)
Pt states the medicines that has been prescribe did not help with her nausea. Is there anyway you can prescribe something else?

## 2020-12-07 NOTE — Telephone Encounter (Signed)
Patient aware.

## 2020-12-08 ENCOUNTER — Other Ambulatory Visit: Payer: Self-pay | Admitting: Obstetrics & Gynecology

## 2020-12-08 LAB — MATERNIT21 PLUS CORE+SCA
Fetal Fraction: 7
Monosomy X (Turner Syndrome): NOT DETECTED
Result (T21): NEGATIVE
Trisomy 13 (Patau syndrome): NEGATIVE
Trisomy 18 (Edwards syndrome): NEGATIVE
Trisomy 21 (Down syndrome): NEGATIVE
XXX (Triple X Syndrome): NOT DETECTED
XXY (Klinefelter Syndrome): NOT DETECTED
XYY (Jacobs Syndrome): NOT DETECTED

## 2020-12-16 ENCOUNTER — Encounter: Payer: Self-pay | Admitting: Intensive Care

## 2020-12-16 ENCOUNTER — Other Ambulatory Visit: Payer: Self-pay

## 2020-12-16 ENCOUNTER — Emergency Department: Payer: Medicaid Other

## 2020-12-16 ENCOUNTER — Emergency Department
Admission: EM | Admit: 2020-12-16 | Discharge: 2020-12-16 | Disposition: A | Discharge status: Home / Self Care | Payer: Medicaid Other | Attending: Emergency Medicine | Admitting: Emergency Medicine

## 2020-12-16 DIAGNOSIS — O26891 Other specified pregnancy related conditions, first trimester: Secondary | ICD-10-CM | POA: Insufficient documentation

## 2020-12-16 DIAGNOSIS — O209 Hemorrhage in early pregnancy, unspecified: Secondary | ICD-10-CM | POA: Diagnosis not present

## 2020-12-16 DIAGNOSIS — I10 Essential (primary) hypertension: Secondary | ICD-10-CM | POA: Insufficient documentation

## 2020-12-16 DIAGNOSIS — Z3A13 13 weeks gestation of pregnancy: Secondary | ICD-10-CM | POA: Insufficient documentation

## 2020-12-16 DIAGNOSIS — O26851 Spotting complicating pregnancy, first trimester: Secondary | ICD-10-CM | POA: Insufficient documentation

## 2020-12-16 DIAGNOSIS — O99891 Other specified diseases and conditions complicating pregnancy: Secondary | ICD-10-CM

## 2020-12-16 DIAGNOSIS — N939 Abnormal uterine and vaginal bleeding, unspecified: Secondary | ICD-10-CM | POA: Diagnosis not present

## 2020-12-16 DIAGNOSIS — Z91040 Latex allergy status: Secondary | ICD-10-CM | POA: Insufficient documentation

## 2020-12-16 DIAGNOSIS — R8271 Bacteriuria: Secondary | ICD-10-CM

## 2020-12-16 DIAGNOSIS — R102 Pelvic and perineal pain: Secondary | ICD-10-CM | POA: Diagnosis not present

## 2020-12-16 LAB — URINALYSIS, COMPLETE (UACMP) WITH MICROSCOPIC
Bilirubin Urine: NEGATIVE
Glucose, UA: NEGATIVE mg/dL
Ketones, ur: NEGATIVE mg/dL
Nitrite: NEGATIVE
Protein, ur: NEGATIVE mg/dL
Specific Gravity, Urine: 1.013 (ref 1.005–1.030)
pH: 6 (ref 5.0–8.0)

## 2020-12-16 LAB — CBC
HCT: 37.6 % (ref 36.0–46.0)
Hemoglobin: 12.9 g/dL (ref 12.0–15.0)
MCH: 29.5 pg (ref 26.0–34.0)
MCHC: 34.3 g/dL (ref 30.0–36.0)
MCV: 86 fL (ref 80.0–100.0)
Platelets: 274 10*3/uL (ref 150–400)
RBC: 4.37 MIL/uL (ref 3.87–5.11)
RDW: 13 % (ref 11.5–15.5)
WBC: 12.9 10*3/uL — ABNORMAL HIGH (ref 4.0–10.5)
nRBC: 0 % (ref 0.0–0.2)

## 2020-12-16 LAB — WET PREP, GENITAL
Clue Cells Wet Prep HPF POC: NONE SEEN
Sperm: NONE SEEN
Trich, Wet Prep: NONE SEEN
Yeast Wet Prep HPF POC: NONE SEEN

## 2020-12-16 LAB — POC URINE PREG, ED: Preg Test, Ur: POSITIVE — AB

## 2020-12-16 LAB — HCG, QUANTITATIVE, PREGNANCY: hCG, Beta Chain, Quant, S: 139342 m[IU]/mL — ABNORMAL HIGH (ref <5)

## 2020-12-16 MED ORDER — NITROFURANTOIN MONOHYD MACRO 100 MG PO CAPS: 100.0000 mg | ORAL_CAPSULE | Freq: Two times a day (BID) | ORAL | 0 refills | Status: AC

## 2020-12-16 NOTE — Discharge Instructions (Signed)
Your exam, labs, and ultrasound are all normal and reassuring at this time.  Follow-up with your OB provider for ongoing management.  Return to the ED if necessary.

## 2020-12-16 NOTE — ED Triage Notes (Signed)
Patient [redacted] weeks pregnant. Awoke this AM with vaginal bleeding. Denies pain

## 2020-12-16 NOTE — ED Provider Notes (Signed)
[facility] Emergency Department Provider Note  ____________________________________________   Event Date/Time   First MD Initiated Contact with Patient 12/16/20 1115     (approximate)  I have reviewed the triage vital signs and the nursing notes.   HISTORY  Chief Complaint Vaginal Bleeding  HPI Mariah Richardson is a 35 y.o. female G4P3, presents herself to the ED for evaluation of vaginal bleeding.  Patient is 13 weeks by LMP, and endorses onset of spontaneous vaginal bleeding this morning.  He describes dark blood per vagina, that she noted in her underwear, and then after her first morning void.  She denies any active bleeding at this time.  She is currently under the care of Thomasene Mohair at [facility].  She denies any fevers, chest pain, shortness of breath patient also denies any dysuria, hematuria, or abnormal vaginal discharge.     Past Medical History:  Diagnosis Date  . Gastritis   . Hypertension     Patient Active Problem List   Diagnosis Date Noted  . Supervision of high risk pregnancy in first trimester 11/06/2020  . Obesity affecting pregnancy in first trimester 11/06/2020  . Chronic migraine 07/30/2019  . History of marijuana use 06/30/2017  . Anxiety 10/03/2015    Past Surgical History:  Procedure Laterality Date  . APPENDECTOMY    . CHOLECYSTECTOMY      Prior to Admission medications   Medication Sig Start Date End Date Taking? Authorizing Provider  acetaminophen (TYLENOL) 500 MG tablet Take 500 mg by mouth every 6 (six) hours as needed.    [provider]  Doxylamine-Pyridoxine (DICLEGIS) 10-10 MG TBEC Take 2 tablets by mouth at bedtime. If symptoms persist, add one tablet in the morning and one in the afternoon 12/04/20   Nadara Mustard, MD  ondansetron (ZOFRAN) 8 MG tablet Take 1 tablet (8 mg total) by mouth every 8 (eight) hours as needed for nausea or vomiting. 12/07/20   Nadara Mustard, MD  promethazine  (PHENERGAN) 25 MG tablet Take 1 tablet (25 mg total) by mouth every 6 (six) hours as needed for nausea or vomiting. 11/27/20   Mirna Mires, CNM    Allergies Amoxicillin, Latex, and Penicillins  Family History  Problem Relation Age of Onset  . Diabetes Mother   . Migraines Father   . Diverticulitis Father   . Cancer Maternal Grandmother   . Stroke Maternal Grandmother   . Heart disease Maternal Grandmother   . Cancer Paternal Grandmother   . Heart disease Paternal Grandfather   . Stroke Paternal Grandfather     Social History Social History   Tobacco Use  . Smoking status: Never Smoker  . Smokeless tobacco: Never Used  Vaping Use  . Vaping Use: Never used  Substance Use Topics  . Alcohol use: No  . Drug use: No    Review of Systems  Constitutional: No fever/chills Eyes: No visual changes. ENT: No sore throat. Cardiovascular: Denies chest pain. Respiratory: Denies shortness of breath. Gastrointestinal: No abdominal pain.  No nausea, no vomiting.  No diarrhea.  No constipation. Genitourinary: Negative for dysuria.  Dark blood in the vagina as described above. Musculoskeletal: Negative for back pain. Skin: Negative for rash. Neurological: Negative for headaches, focal weakness or numbness. ___________________________________________   PHYSICAL EXAM:  VITAL SIGNS: ED Triage Vitals  Enc Vitals Group     BP 12/16/20 1030 (!) 123/56     Pulse Rate 12/16/20 1030 (!) 105     Resp  12/16/20 1030 16     Temp 12/16/20 1030 98.6 F (37 C)     Temp Source 12/16/20 1030 Oral     SpO2 12/16/20 1030 98 %     Weight 12/16/20 1031 210 lb (95.3 kg)     Height 12/16/20 1031 5\' 2"  (1.575 m)     Head Circumference --      Peak Flow --      Pain Score 12/16/20 1031 0     Pain Loc --      Pain Edu? --      Excl. in GC? --     Constitutional: Alert and oriented. Well appearing and in no acute distress. Eyes: Conjunctivae are normal. PERRL. EOMI. Head: Atraumatic. Nose:  No congestion/rhinnorhea. Mouth/Throat: Mucous membranes are moist.  Oropharynx non-erythematous. Neck: No stridor.   Cardiovascular: Normal rate, regular rhythm. Grossly normal heart sounds.  Good peripheral circulation. Respiratory: Normal respiratory effort.  No retractions. Lungs CTAB. Gastrointestinal: Soft and nontender. No distention. No abdominal bruits. No CVA tenderness. Genitourinary: Normal external genitalia.  Dark mucoid discharge noted in the vault.  Cervical os is closed.  No CMT appreciated. Musculoskeletal: No lower extremity tenderness nor edema.  No joint effusions. Neurologic:  Normal speech and language. No gross focal neurologic deficits are appreciated. No gait instability. Skin:  Skin is warm, dry and intact. No rash noted. Psychiatric: Mood and affect are normal. Speech and behavior are normal.  ____________________________________________   LABS (all labs ordered are listed, but only abnormal results are displayed)  Labs Reviewed  WET PREP, GENITAL - Abnormal; Notable for the following components:      Result Value   WBC, Wet Prep HPF POC MODERATE (*)    All other components within normal limits  HCG, QUANTITATIVE, PREGNANCY - Abnormal; Notable for the following components:   hCG, Beta Chain, Quant, 12/18/20 Kathie Rhodes (*)    All other components within normal limits  CBC - Abnormal; Notable for the following components:   WBC 12.9 (*)    All other components within normal limits  URINALYSIS, COMPLETE (UACMP) WITH MICROSCOPIC - Abnormal; Notable for the following components:   Color, Urine YELLOW (*)    APPearance HAZY (*)    Hgb urine dipstick LARGE (*)    Leukocytes,Ua SMALL (*)    Bacteria, UA MANY (*)    All other components within normal limits  POC URINE PREG, ED - Abnormal; Notable for the following components:   Preg Test, Ur POSITIVE (*)    All other components within normal limits    ____________________________________________  EKG  ____________________________________________  RADIOLOGY I, [phone], personally viewed and evaluated these images (plain radiographs) as part of my medical decision making, as well as reviewing the written report by the radiologist.  ED MD interpretation:  Agree with report  Official radiology report(s): Lissa Hoard OB Comp Less 14 Wks  Result Date: 12/16/2020 CLINICAL DATA:  Pregnant patient with pelvic pain and vaginal bleeding today. Patient is 13 weeks and 4 days pregnant based on her last menstrual period. Beta HCG level 139,342. EXAM: OBSTETRIC <14 WK ULTRASOUND TECHNIQUE: Transabdominal ultrasound was performed for evaluation of the gestation as well as the maternal uterus and adnexal regions. COMPARISON:  11/12/2020. FINDINGS: Intrauterine gestational sac: Single Yolk sac:  Not Visualized. Embryo:  Visualized. Cardiac Activity: Visualized. Heart Rate: 153 bpm CRL:   76.9 mm   13 w 5 d  [place] EDC: 06/18/2021 Subchorionic hemorrhage:  None visualized. Maternal uterus/adnexae: No uterine masses. Cervix is closed. Ovaries and adnexa are unremarkable. No pelvic free fluid. IMPRESSION: 1. Single live intrauterine pregnancy with a measured gestational age of [redacted] weeks and 5 days, representing normal growth since the previous Ob ultrasound. No pregnancy complication. Ovaries and adnexa are unremarkable. Electronically Signed   By: Amie Portland M.D.   On: 12/16/2020 14:08  ____________________________________________   PROCEDURES  Procedure(s) performed (including Critical Care):  Procedures ____________________________________________   INITIAL IMPRESSION / ASSESSMENT AND PLAN / ED COURSE  As part of my medical decision making, I reviewed the following data within the electronic MEDICAL RECORD NUMBER Labs reviewed WNL, Radiograph reviewed as above and Notes from prior ED visits  Differential diagnosis includes, but is  not limited to, threatened miscarriage, incomplete miscarriage, normal bleeding from an early trimester pregnancy, ectopic pregnancy, blighted ovum, vaginal/cervical trauma, subchorionic hemorrhage/hematoma, etc.  Female patient at 13+ weeks by LMP, presents for onset of vaginal bleeding this AM.  She denies any abdominal pelvic pain.  Also denies any fever, chills, chest pain, vomiting.  She was evaluated for complaint in the ED.  Labs are normal and reassuring at this time.  Wet prep did not reveal any acute vaginitis.  Her exam did not show any active vaginal bleeding, and noted os closed cervical os.  Ultrasound did confirm a single IUP with normal cardiac activity and no acute findings.  Patient is discharged to follow-up with her OB provider as scheduled.  Return precautions have been discussed. ____________________________________________   FINAL CLINICAL IMPRESSION(S) / ED DIAGNOSES  Final diagnoses:  Vaginal bleeding  Pelvic pain     ED Discharge Orders    None      *Please note:  Mariah Richardson was evaluated in Emergency Department on 12/16/2020 for the symptoms described in the history of present illness. She was evaluated in the context of the global COVID-19 pandemic, which necessitated consideration that the patient might be at risk for infection with the SARS-CoV-2 virus that causes COVID-19. Institutional protocols and algorithms that pertain to the evaluation of patients at risk for COVID-19 are in a state of rapid change based on information released by regulatory bodies including the CDC and federal and state organizations. These policies and algorithms were followed during the patient's care in the ED.  Some ED evaluations and interventions may be delayed as a result of limited staffing during and the pandemic.*   Note:  This document was prepared using Dragon voice recognition software and may include unintentional dictation errors.    Lissa Hoard,  PA-C 12/16/20 1438    Chesley Noon, MD 12/17/20 [phone]

## 2020-12-19 ENCOUNTER — Telehealth: Payer: Self-pay

## 2020-12-19 NOTE — Telephone Encounter (Signed)
Pt calling; was seen in ED Saturday for bleeding a whole lot; was told the baby is okay; is still having cramps and bleeding; what is bleeding from is everything is okay?  [phone]  Pt states bleeding is heavy then light; right now it's light; adv cramping is normal through out the whole preg as long as she isn't doubling over or stopping her in her tracks; pt states she hasn't had IC since she got preg.  Adv I couldn't tell her what the bleeding is from; offered appt or did she want to wait and see what it does.  Pt opts to wait and see what it does.  If continues will sched appt.

## 2020-12-20 ENCOUNTER — Ambulatory Visit: Payer: Medicaid Other | Admitting: Obstetrics

## 2020-12-20 ENCOUNTER — Other Ambulatory Visit: Payer: Self-pay

## 2020-12-28 ENCOUNTER — Other Ambulatory Visit: Payer: Self-pay

## 2021-01-01 ENCOUNTER — Other Ambulatory Visit: Payer: Self-pay

## 2021-01-01 ENCOUNTER — Encounter: Payer: Self-pay | Admitting: Obstetrics and Gynecology

## 2021-01-01 ENCOUNTER — Other Ambulatory Visit: Payer: Self-pay | Admitting: Obstetrics & Gynecology

## 2021-01-01 ENCOUNTER — Ambulatory Visit (INDEPENDENT_AMBULATORY_CARE_PROVIDER_SITE_OTHER): Payer: Medicaid Other | Admitting: Obstetrics and Gynecology

## 2021-01-01 ENCOUNTER — Other Ambulatory Visit: Payer: Medicaid Other

## 2021-01-01 VITALS — BP 120/62 | Wt 218.0 lb

## 2021-01-01 DIAGNOSIS — O0992 Supervision of high risk pregnancy, unspecified, second trimester: Secondary | ICD-10-CM

## 2021-01-01 DIAGNOSIS — O99211 Obesity complicating pregnancy, first trimester: Secondary | ICD-10-CM

## 2021-01-01 DIAGNOSIS — O99212 Obesity complicating pregnancy, second trimester: Secondary | ICD-10-CM

## 2021-01-01 DIAGNOSIS — Z3A15 15 weeks gestation of pregnancy: Secondary | ICD-10-CM

## 2021-01-01 LAB — POCT URINALYSIS DIPSTICK OB: Glucose, UA: NEGATIVE

## 2021-01-01 NOTE — Progress Notes (Signed)
Routine Prenatal Care Visit  Subjective  Mariah Richardson is a 35 y.o. [phone] at [redacted]w[redacted]d being seen today for ongoing prenatal care.  She is currently monitored for the following issues for this low-risk pregnancy and has Supervision of high risk pregnancy in first trimester; Obesity affecting pregnancy in first trimester; History of marijuana use; Chronic migraine; and Anxiety on their problem list.  ----------------------------------------------------------------------------------- Patient reports going to ER 2 weeks ago due to bleeding. This has stopped. Now with mucus discharge. No concern for STI.    . Vag. Bleeding: None.  Movement: Absent. Leaking Fluid denies.  ----------------------------------------------------------------------------------- The following portions of the patient's history were reviewed and updated as appropriate: allergies, current medications, past family history, past medical history, past social history, past surgical history and problem list. Problem list updated.  Objective  Blood pressure 120/62, weight 218 lb (98.9 kg), last menstrual period 09/12/2020. Pregravid weight 209 lb (94.8 kg) Total Weight Gain 9 lb (4.082 kg) Urinalysis: Urine Protein Trace  Urine Glucose Negative  Fetal Status: Fetal Heart Rate (bpm): 148   Movement: Absent     General:  Alert, oriented and cooperative. Patient is in no acute distress.  Skin: Skin is warm and dry. No rash noted.   Cardiovascular: Normal heart rate noted  Respiratory: Normal respiratory effort, no problems with respiration noted  Abdomen: Soft, gravid, appropriate for gestational age. Pain/Pressure: Present     Pelvic:  Cervical exam deferred        Extremities: Normal range of motion.     Mental Status: Normal mood and affect. Normal behavior. Normal judgment and thought content.   Assessment   35 y.o. G4P3003 at [redacted]w[redacted]d by  06/19/2021, by Last Menstrual Period presenting for routine prenatal visit  Plan    pregnancy4  Problems (from 09/12/20 to present)     Problem Noted Resolved   Supervision of high risk pregnancy in first trimester 11/06/2020 by Tresea Mall, CNM No   Overview Addendum 12/06/2020  9:03 AM by Mirna Mires, CNM    [facility] Prenatal Labs  Dating By LMP c/w 6w u/s Blood type: B/Positive/-- (04/18 1453)   Genetic Screen      NIPS: Antibody:Negative (04/18 1453)  Anatomic [place]  Rubella: 4.29 (04/18 1453) immuneNR Varicella: Imm  GTT Early: Hgb A1C              Third trimester:  RPR: Reactive (04/18 1453) reactive, with neg Pallidum  Rhogam  negative  Vaccines TDAP:                       Flu Shot: Covid:  NR  Baby Food Plans breast                               GBS:   GC/CT:  Contraception  Pap: 2021 negative  CBB     CS/VBAC NA   Support Person Parents: Renee and Joe    Hx of pre eclampsia- early baseline labs: UPC 84 Daily ASA       Obesity affecting pregnancy in first trimester 11/06/2020 by Tresea Mall, CNM No   Overview Signed 12/04/2020  3:06 PM by Nadara Mustard, MD    BMI >=40 [ ]  early 1h gtt -  [ ]  screen sleep apnea [ ]  anesthesia consult (early and late if BMI > 45) [x ] u/s for dating [x ]  [x ] nutritional goals [x ]  folic acid 1mg  [x ] bASA (>12 weeks) [ ]  consider nutrition consult [ ]  consider maternal EKG 1st trimester [ ]  Growth u/s 28 [ ] , 32 [ ] , 36 weeks [ ]  [ ]  NST/AFI weekly 34+ weeks (34[] ,35[] ,36[] , 37[] , 38[] , 39[] , 40[] ) [ ]  IOL by 41 weeks (scheduled, prn [] )             Preterm labor symptoms and general obstetric precautions including but not limited to vaginal bleeding, contractions, leaking of fluid and fetal movement were reviewed in detail with the patient. Please refer to After Visit Summary for other counseling recommendations.   - bedside u/s confirms viable pregnancy with no obvious source for bleeding. - patient declined pelvic for today. She was encouraged to follow up if sx worsen or continue.    Return for Keep previously scheduled appointments.   , MD, OB/GYN, [facility] 01/01/2021 3:44 PM

## 2021-01-02 LAB — GLUCOSE, 1 HOUR GESTATIONAL: Gestational Diabetes Screen: 108 mg/dL (ref 65–139)

## 2021-01-23 ENCOUNTER — Ambulatory Visit
Admission: RE | Admit: 2021-01-23 | Discharge: 2021-01-23 | Disposition: A | Discharge status: Home / Self Care | Payer: Medicaid Other | Source: Ambulatory Visit | Attending: Obstetrics & Gynecology | Admitting: Obstetrics & Gynecology

## 2021-01-23 ENCOUNTER — Other Ambulatory Visit: Payer: Self-pay

## 2021-01-23 DIAGNOSIS — Z3689 Encounter for other specified antenatal screening: Secondary | ICD-10-CM | POA: Diagnosis present

## 2021-01-24 ENCOUNTER — Encounter: Payer: Medicaid Other | Admitting: Obstetrics and Gynecology

## 2021-01-31 ENCOUNTER — Encounter: Payer: Self-pay | Admitting: Obstetrics and Gynecology

## 2021-01-31 ENCOUNTER — Ambulatory Visit (INDEPENDENT_AMBULATORY_CARE_PROVIDER_SITE_OTHER): Payer: Medicaid Other | Admitting: Obstetrics and Gynecology

## 2021-01-31 ENCOUNTER — Other Ambulatory Visit: Payer: Self-pay

## 2021-01-31 VITALS — BP 124/66 | HR 114 | Wt 225.1 lb

## 2021-01-31 DIAGNOSIS — Z3A20 20 weeks gestation of pregnancy: Secondary | ICD-10-CM

## 2021-01-31 DIAGNOSIS — O0992 Supervision of high risk pregnancy, unspecified, second trimester: Secondary | ICD-10-CM

## 2021-01-31 DIAGNOSIS — O99212 Obesity complicating pregnancy, second trimester: Secondary | ICD-10-CM

## 2021-01-31 DIAGNOSIS — R768 Other specified abnormal immunological findings in serum: Secondary | ICD-10-CM

## 2021-01-31 DIAGNOSIS — O219 Vomiting of pregnancy, unspecified: Secondary | ICD-10-CM

## 2021-01-31 DIAGNOSIS — O09299 Supervision of pregnancy with other poor reproductive or obstetric history, unspecified trimester: Secondary | ICD-10-CM

## 2021-01-31 DIAGNOSIS — F419 Anxiety disorder, unspecified: Secondary | ICD-10-CM

## 2021-01-31 DIAGNOSIS — Z8669 Personal history of other diseases of the nervous system and sense organs: Secondary | ICD-10-CM

## 2021-01-31 MED ORDER — PROMETHAZINE HCL 25 MG PO TABS: 25.0000 mg | ORAL_TABLET | Freq: Four times a day (QID) | ORAL | 1 refills | Status: DC | PRN

## 2021-01-31 MED ORDER — ONDANSETRON HCL 8 MG PO TABS: 8.0000 mg | ORAL_TABLET | Freq: Three times a day (TID) | ORAL | 0 refills | Status: DC | PRN

## 2021-01-31 NOTE — Progress Notes (Signed)
OB/Transfer Pt present for routine prenatal care. Pt c/o lower abd pain, unable to walk at times, mucous with light blood in it.

## 2021-01-31 NOTE — Progress Notes (Signed)
TRANSFER IN OB HISTORY AND PHYSICAL  SUBJECTIVE:       Mariah Richardson is a 35 y.o. [phone] female, Patient's last menstrual period was 09/12/2020., Estimated Date of Delivery: 06/19/21, [redacted]w[redacted]d, presents today for Transition of Prenatal Care. She is transferring her care from [facility].  Conception occurred through self insemination.  Complaints today include: abdominal discomfort due to fetal growth and movement. Also still noting nausea and vomiting despite recent anti-emetic use.       Gynecologic History Patient's last menstrual period was 09/12/2020. Normal Contraception: none Last Pap: 08/09/2019 reviewed in Care Everywhere (performed at [facility]). Results were: normal  Obstetric History OB History  Gravida Para Term Preterm AB Living  4 3 3     3   SAB IAB Ectopic Multiple Live Births        0 3    # Outcome Date GA Lbr Len/2nd Weight Sex Delivery Anes PTL Lv  4 Current           3 Term 01/05/18 [redacted]w[redacted]d / 00:46 6 lb 12.3 oz (3.07 kg) F Vag-Spont EPI  LIV     Complications: Pre-eclampsia  2 Term 02/10/12 [redacted]w[redacted]d  7 lb 7 oz (3.374 kg) M Vag-Spont   LIV  1 Term 05/10/06 [redacted]w[redacted]d  7 lb 10 oz (3.459 kg) F Vag-Spont   LIV     Complications: Preeclampsia    Past Medical History:  Diagnosis Date   Gastritis    Hypertension     Past Surgical History:  Procedure Laterality Date   APPENDECTOMY     CHOLECYSTECTOMY      Current Outpatient Medications on File Prior to Visit  Medication Sig Dispense Refill   acetaminophen (TYLENOL) 500 MG tablet Take 500 mg by mouth every 6 (six) hours as needed.     Doxylamine-Pyridoxine (DICLEGIS) 10-10 MG TBEC Take 2 tablets by mouth at bedtime. If symptoms persist, add one tablet in the morning and one in the afternoon 100 tablet 5   ondansetron (ZOFRAN) 8 MG tablet Take 1 tablet (8 mg total) by mouth every 8 (eight) hours as needed for nausea or vomiting. 20 tablet 0   Pediatric Multivit-Minerals-C (FLINTSTONES GUMMIES COMPLETE PO) Take by  mouth.     No current facility-administered medications on file prior to visit.    Allergies  Allergen Reactions   Amoxicillin    Latex    Penicillins     Social History   Socioeconomic History   Marital status: Single    Spouse name: Not on file   Number of children: Not on file   Years of education: Not on file   Highest education level: Not on file  Occupational History   Not on file  Tobacco Use   Smoking status: Never   Smokeless tobacco: Never  Vaping Use   Vaping Use: Never used  Substance and Sexual Activity   Alcohol use: No   Drug use: No   Sexual activity: Not Currently    Birth control/protection: None  Other Topics Concern   Not on file  Social History Narrative   Not on file   Social Determinants of Health   Financial Resource Strain: Not on file  Food Insecurity: Not on file  Transportation Needs: Not on file  Physical Activity: Not on file  Stress: Not on file  Social Connections: Not on file  Intimate Partner Violence: Not on file    Family History  Problem Relation Age of Onset   Diabetes Mother  Migraines Father    Diverticulitis Father    Cancer Maternal Grandmother    Stroke Maternal Grandmother    Heart disease Maternal Grandmother    Cancer Paternal Grandmother    Heart disease Paternal Grandfather    Stroke Paternal Grandfather     The following portions of the patient's history were reviewed and updated as appropriate: allergies, current medications, past OB history, past medical history, past surgical history, past family history, past social history, and problem list.    OBJECTIVE: Initial Physical Exam (New OB)  GENERAL APPEARANCE: alert, well appearing, in no apparent distress HEAD: normocephalic, atraumatic MOUTH: mucous membranes moist, pharynx normal without lesions THYROID: no thyromegaly or masses present BREASTS: not examined LUNGS: clear to auscultation, no wheezes, rales or rhonchi, symmetric air  entry HEART: regular rate and rhythm, no murmurs ABDOMEN: soft, nontender, nondistended, no abnormal masses, no epigastric pain. FHT 144 bpm. FH 20 cm.  EXTREMITIES: no redness or tenderness in the calves or thighs, no edema SKIN: normal coloration and turgor, no rashes NEUROLOGIC: alert, oriented, normal speech, no focal findings or movement disorder noted  PELVIC EXAM Deferred  ASSESSMENT: 1. Supervision of high risk pregnancy in second trimester   2. [redacted] weeks gestation of pregnancy   3. History of pre-eclampsia in prior pregnancy, currently pregnant   4. Nausea and vomiting during pregnancy   5. History of migraine   6. Anxiety   7. Biological false positive RPR test   8. Obesity affecting pregnancy in second trimester      PLAN: - Prenatal care explained, overview of the practice discussed.  Patient has had anatomy scan which was normal.  To continue routine prenatal care. - Patient advised to continue aspirin for high risk pregnancy - Has had baseline PIH labs performed for history of preeclampsia - Monitor BMI, if greater than 45 will need anesthesia consult.  Discussed need for antenatal testing beginning at 36 weeks and IOL in 39th week for obesity.  Hemoglobin A1c performed during pregnancy was normal (5.4). -Nausea and vomiting in pregnancy, patient has been on Zofran and Diclegis just with no resolution.  Has also been on Reglan in the past which did not help.  Has tried Phenergan in the past which worked for her.  Will prescribe Phenergan for the evenings, and patient can get refill of Zofran to take during the day. -History of migraines, has been evaluated by neurology in the past.  Currently on no medications at this time. -History of anxiety, currently asymptomatic, no medications at this time. -Biological false positive RPR test.  Patient notes that this happens happened with each pregnancy. - History of marijuana use, will need UDS in third trimester. -RTC in 4 weeks  for next visit.   Hildred Laser, MD [facility]

## 2021-01-31 NOTE — Patient Instructions (Signed)
Second Trimester of Pregnancy  The second trimester of pregnancy is from week 13 through week 27. This is also called months 4 through 6 of pregnancy. This is often the time when you feelyour best. During the second trimester: Morning sickness is less or has stopped. You may have more energy. You may feel hungry more often. At this time, your unborn baby (fetus) is growing very fast. At the end of the sixth month, the unborn baby may be up to 12 inches long and weigh about 1 pounds. You will likely start to feelthe baby move between 16 and 20 weeks of pregnancy. Body changes during your second trimester Your body continues to go through many changes during this time. The changesvary and generally return to normal after the baby is born. Physical changes You will gain more weight. You may start to get stretch marks on your hips, belly (abdomen), and breasts. Your breasts will grow and may hurt. Dark spots or blotches may develop on your face. A dark line from your belly button to the pubic area (linea nigra) may appear. You may have changes in your hair. Health changes You may have headaches. You may have heartburn. You may have trouble pooping (constipation). You may have hemorrhoids or swollen, bulging veins (varicose veins). Your gums may bleed. You may pee (urinate) more often. You may have back pain. Follow these instructions at home: Medicines Take over-the-counter and prescription medicines only as told by your doctor. Some medicines are not safe during pregnancy. Take a prenatal vitamin that contains at least 600 micrograms (mcg) of folic acid. Eating and drinking Eat healthy meals that include: Fresh fruits and vegetables. Whole grains. Good sources of protein, such as meat, eggs, or tofu. Low-fat dairy products. Avoid raw meat and unpasteurized juice, milk, and cheese. You may need to take these actions to prevent or treat trouble pooping: Drink enough fluids to keep  your pee (urine) pale yellow. Eat foods that are high in fiber. These include beans, whole grains, and fresh fruits and vegetables. Limit foods that are high in fat and sugar. These include fried or sweet foods. Activity Exercise only as told by your doctor. Most people can do their usual exercise during pregnancy. Try to exercise for 30 minutes at least 5 days a week. Stop exercising if you have pain or cramps in your belly or lower back. Do not exercise if it is too hot or too humid, or if you are in a place of great height (high altitude). Avoid heavy lifting. If you choose to, you may have sex unless your doctor tells you not to. Relieving pain and discomfort Wear a good support bra if your breasts are sore. Take warm water baths (sitz baths) to soothe pain or discomfort caused by hemorrhoids. Use hemorrhoid cream if your doctor approves. Rest with your legs raised (elevated) if you have leg cramps or low back pain. If you develop bulging veins in your legs: Wear support hose as told by your doctor. Raise your feet for 15 minutes, 3-4 times a day. Limit salt in your food. Safety Wear your seat belt at all times when you are in a car. Talk with your doctor if someone is hurting you or yelling at you a lot. Lifestyle Do not use hot tubs, steam rooms, or saunas. Do not douche. Do not use tampons or scented sanitary pads. Avoid cat litter boxes and soil used by cats. These carry germs that can harm your baby and can cause   a loss of your baby by miscarriage or stillbirth. Do not use herbal medicines, illegal drugs, or medicines that are not approved by your doctor. Do not drink alcohol. Do not smoke or use any products that contain nicotine or tobacco. If you need help quitting, ask your doctor. General instructions Keep all follow-up visits. This is important. Ask your doctor about local prenatal classes. Ask your doctor about the right foods to eat or for help finding a  counselor. Where to find more information American Pregnancy Association: americanpregnancy.org American College of Obstetricians and Gynecologists: www.acog.org Office on Women's Health: womenshealth.gov/pregnancy Contact a doctor if: You have a headache that does not go away when you take medicine. You have changes in how you see, or you see spots in front of your eyes. You have mild cramps, pressure, or pain in your lower belly. You continue to feel like you may vomit (nauseous), you vomit, or you have watery poop (diarrhea). You have bad-smelling fluid coming from your vagina. You have pain when you pee or your pee smells bad. You have very bad swelling of your face, hands, ankles, feet, or legs. You have a fever. Get help right away if: You are leaking fluid from your vagina. You have spotting or bleeding from your vagina. You have very bad belly cramping or pain. You have trouble breathing. You have chest pain. You faint. You have not felt your baby move for the time period told by your doctor. You have new or increased pain, swelling, or redness in an arm or leg. Summary The second trimester of pregnancy is from week 13 through week 27 (months 4 through 6). Eat healthy meals. Exercise as told by your doctor. Most people can do their usual exercise during pregnancy. Do not use herbal medicines, illegal drugs, or medicines that are not approved by your doctor. Do not drink alcohol. Call your doctor if you get sick or if you notice anything unusual about your pregnancy. This information is not intended to replace advice given to you by your health care provider. Make sure you discuss any questions you have with your healthcare provider. Document Revised: 12/15/2019 Document Reviewed: 10/21/2019 Elsevier Patient Education  2022 Else[employer] Inc. Common Medications Safe in Pregnancy  Acne:      Constipation:  Benzoyl Peroxide     Colace  Clindamycin      Dulcolax  Suppository  Topica Erythromycin     Fibercon  Salicylic Acid      Metamucil         Miralax AVOID:        Senakot   Accutane    Cough:  Retin-A       Cough Drops  Tetracycline      Phenergan w/ Codeine if Rx  Minocycline      Robitussin (Plain & DM)  Antibiotics:     Crabs/Lice:  Ceclor       RID  Cephalosporins    AVOID:  E-Mycins      Kwell  Keflex  Macrobid/Macrodantin   Diarrhea:  Penicillin      Kao-Pectate  Zithromax      Imodium AD         PUSH FLUIDS AVOID:       Cipro     Fever:  Tetracycline      Tylenol (Regular or Extra  Minocycline       Strength)  Levaquin      Extra Strength-Do not            Exceed 8 tabs/24 hrs Caffeine:        <200mg/day (equiv. To 1 cup of coffee or  approx. 3 12 oz sodas)         Gas: Cold/Hayfever:       Gas-X  Benadryl      Mylicon  Claritin       Phazyme  **Claritin-D        Chlor-Trimeton    Headaches:  Dimetapp      ASA-Free Excedrin  Drixoral-Non-Drowsy     Cold Compress  Mucinex (Guaifenasin)     Tylenol (Regular or Extra  Sudafed/Sudafed-12 Hour     Strength)  **Sudafed PE Pseudoephedrine   Tylenol Cold & Sinus     Vicks Vapor Rub  Zyrtec  **AVOID if Problems With Blood Pressure         Heartburn: Avoid lying down for at least 1 hour after meals  Aciphex      Maalox     Rash:  Milk of Magnesia     Benadryl    Mylanta       1% Hydrocortisone Cream  Pepcid  Pepcid Complete   Sleep Aids:  Prevacid      Ambien   Prilosec       Benadryl  Rolaids       Chamomile Tea  Tums (Limit 4/day)     Unisom         Tylenol PM         Warm milk-add vanilla or  Hemorrhoids:       Sugar for taste  Anusol/Anusol H.C.  (RX: Analapram 2.5%)  Sugar Substitutes:  Hydrocortisone OTC     Ok in moderation  Preparation H      Tucks        Vaseline lotion applied to tissue with wiping    Herpes:     Throat:  Acyclovir      Oragel  Famvir  Valtrex     Vaccines:         Flu Shot Leg  Cramps:       *Gardasil  Benadryl      Hepatitis A         Hepatitis B Nasal Spray:       Pneumovax  Saline Nasal Spray     Polio Booster         Tetanus Nausea:       Tuberculosis test or PPD  Vitamin B6 25 mg TID   AVOID:    Dramamine      *Gardasil  Emetrol       Live Poliovirus  Ginger Root 250 mg QID    MMR (measles, mumps &  High Complex Carbs @ Bedtime    rebella)  Sea Bands-Accupressure    Varicella (Chickenpox)  Unisom 1/2 tab TID     *No known complications           If received before Pain:         Known pregnancy;   Darvocet       Resume series after  Lortab        Delivery  Percocet    Yeast:   Tramadol      Femstat  Tylenol 3      Gyne-lotrimin  Ultram       Monistat  Vicodin           MISC:         All Sunscreens             Hair Coloring/highlights          Insect Repellant's          (Including DEET)         Mystic Tans  

## 2021-02-28 ENCOUNTER — Ambulatory Visit (INDEPENDENT_AMBULATORY_CARE_PROVIDER_SITE_OTHER): Payer: Medicaid Other | Admitting: Obstetrics and Gynecology

## 2021-02-28 ENCOUNTER — Other Ambulatory Visit: Payer: Self-pay

## 2021-02-28 ENCOUNTER — Encounter: Payer: Self-pay | Admitting: Obstetrics and Gynecology

## 2021-02-28 VITALS — BP 113/78 | HR 98 | Wt 236.3 lb

## 2021-02-28 DIAGNOSIS — O0942 Supervision of pregnancy with grand multiparity, second trimester: Secondary | ICD-10-CM

## 2021-02-28 DIAGNOSIS — Z3403 Encounter for supervision of normal first pregnancy, third trimester: Secondary | ICD-10-CM

## 2021-02-28 DIAGNOSIS — Z3A24 24 weeks gestation of pregnancy: Secondary | ICD-10-CM

## 2021-02-28 LAB — POCT URINALYSIS DIPSTICK OB
Bilirubin, UA: NEGATIVE
Blood, UA: NEGATIVE
Glucose, UA: NEGATIVE
Ketones, UA: NEGATIVE
Leukocytes, UA: NEGATIVE
Nitrite, UA: NEGATIVE
POC,PROTEIN,UA: NEGATIVE
Spec Grav, UA: 1.02 (ref 1.010–1.025)
Urobilinogen, UA: 0.2 E.U./dL
pH, UA: 6 (ref 5.0–8.0)

## 2021-02-28 NOTE — Progress Notes (Signed)
ROB: She said she feels miserable. She has no new concerns.

## 2021-02-28 NOTE — Progress Notes (Signed)
ROB: Complains of occasional headaches.  Discussed increased hydration and use of Claritin.  Patient taking ASA as directed.  Possible early delivery for PIH discussed.  1 hour GCT at next visit.

## 2021-03-27 ENCOUNTER — Other Ambulatory Visit: Payer: Medicaid Other

## 2021-03-27 ENCOUNTER — Ambulatory Visit (INDEPENDENT_AMBULATORY_CARE_PROVIDER_SITE_OTHER): Payer: Medicaid Other | Admitting: Obstetrics and Gynecology

## 2021-03-27 ENCOUNTER — Encounter: Payer: Self-pay | Admitting: Obstetrics and Gynecology

## 2021-03-27 ENCOUNTER — Other Ambulatory Visit: Payer: Self-pay

## 2021-03-27 VITALS — BP 123/64 | HR 96 | Wt 234.6 lb

## 2021-03-27 DIAGNOSIS — O09299 Supervision of pregnancy with other poor reproductive or obstetric history, unspecified trimester: Secondary | ICD-10-CM

## 2021-03-27 DIAGNOSIS — O99212 Obesity complicating pregnancy, second trimester: Secondary | ICD-10-CM

## 2021-03-27 DIAGNOSIS — O26849 Uterine size-date discrepancy, unspecified trimester: Secondary | ICD-10-CM

## 2021-03-27 DIAGNOSIS — Z3A28 28 weeks gestation of pregnancy: Secondary | ICD-10-CM

## 2021-03-27 DIAGNOSIS — R42 Dizziness and giddiness: Secondary | ICD-10-CM

## 2021-03-27 DIAGNOSIS — Z3483 Encounter for supervision of other normal pregnancy, third trimester: Secondary | ICD-10-CM

## 2021-03-27 DIAGNOSIS — K0889 Other specified disorders of teeth and supporting structures: Secondary | ICD-10-CM

## 2021-03-27 LAB — POCT URINALYSIS DIPSTICK OB
Bilirubin, UA: NEGATIVE
Blood, UA: NEGATIVE
Glucose, UA: NEGATIVE
Ketones, UA: NEGATIVE
Leukocytes, UA: NEGATIVE
Nitrite, UA: NEGATIVE
POC,PROTEIN,UA: NEGATIVE
Spec Grav, UA: 1.01 (ref 1.010–1.025)
Urobilinogen, UA: 0.2 E.U./dL
pH, UA: 6 (ref 5.0–8.0)

## 2021-03-27 MED ORDER — TETANUS-DIPHTH-ACELL PERTUSSIS 5-2.5-18.5 LF-MCG/0.5 IM SUSY
0.5000 mL | PREFILLED_SYRINGE | Freq: Once | INTRAMUSCULAR | Status: AC
  Administered 2021-03-27: 0.5 mL via INTRAMUSCULAR

## 2021-03-27 NOTE — Progress Notes (Signed)
   OB-Pt present for routine prenatal care. Pt stated fetal movement present; braxton hick contractions present; no vaginal bleeding and no changes in vaginal discharge.     Pt c/o lightheadedness/dizziness along with a lot of vaginal pain and pressure. Pt stated that she has been drinking plenty of fluids and eating regularly.                             Tdap/BTC completed.

## 2021-03-27 NOTE — Patient Instructions (Signed)
Third Trimester of Pregnancy The third trimester of pregnancy is from week 28 through week 68. This is also called months 7 through 9. This trimester is when your unborn baby (fetus) is growing very fast. At the end of the ninth month, the unborn baby is about 20 inches long. It weighs about 6-10 pounds. Body changes during your third trimester Your body continues to go through many changes during this time. The changes vary and generally return to normal after the baby is born. Physical changes Your weight will continue to increase. You may gain 25-35 pounds (11-16 kg) by the end of the pregnancy. If you are underweight, you may gain 28-40 lb (about 13-18 kg). If you are overweight, you may gain 15-25 lb (about 7-11 kg). You may start to get stretch marks on your hips, belly (abdomen), and breasts. Your breasts will continue to grow and may hurt. A yellow fluid (colostrum) may leak from your breasts. This is the first milk you are making for your baby. You may have changes in your hair. Your belly button may stick out. You may have more swelling in your hands, face, or ankles. Health changes You may have heartburn. You may have trouble pooping (constipation). You may get hemorrhoids. These are swollen veins in the butt that can itch or get painful. You may have swollen veins (varicose veins) in your legs. You may have more body aches in the pelvis, back, or thighs. You may have more tingling or numbness in your hands, arms, and legs. The skin on your belly may also feel numb. You may feel short of breath as your womb (uterus) gets bigger. Other changes You may pee (urinate) more often. You may have more problems sleeping. You may notice the unborn baby "dropping," or moving lower in your belly. You may have more discharge coming from your vagina. Your joints may feel loose, and you may have pain around your pelvic bone. Follow these instructions at home: Medicines Take over-the-counter  and prescription medicines only as told by your doctor. Some medicines are not safe during pregnancy. Take a prenatal vitamin that contains at least 600 micrograms (mcg) of folic acid. Eating and drinking Eat healthy meals that include: Fresh fruits and vegetables. Whole grains. Good sources of protein, such as meat, eggs, or tofu. Low-fat dairy products. Avoid raw meat and unpasteurized juice, milk, and cheese. These carry germs that can harm you and your baby. Eat 4 or 5 small meals rather than 3 large meals a day. You may need to take these actions to prevent or treat trouble pooping: Drink enough fluids to keep your pee (urine) pale yellow. Eat foods that are high in fiber. These include beans, whole grains, and fresh fruits and vegetables. Limit foods that are high in fat and sugar. These include fried or sweet foods. Activity Exercise only as told by your doctor. Stop exercising if you start to have cramps in your womb. Avoid heavy lifting. Do not exercise if it is too hot or too humid, or if you are in a place of great height (high altitude). If you choose to, you may have sex unless your doctor tells you not to. Relieving pain and discomfort Take breaks often, and rest with your legs raised (elevated) if you have leg cramps or low back pain. Take warm water baths (sitz baths) to soothe pain or discomfort caused by hemorrhoids. Use hemorrhoid cream if your doctor approves. Wear a good support bra if your breasts are tender.  If you develop bulging, swollen veins in your legs: Wear support hose as told by your doctor. Raise your feet for 15 minutes, 3-4 times a day. Limit salt in your food. Safety Talk to your doctor before traveling far distances. Do not use hot tubs, steam rooms, or saunas. Wear your seat belt at all times when you are in a car. Talk with your doctor if someone is hurting you or yelling at you a lot. Preparing for your baby's arrival To prepare for the arrival  of your baby: Take prenatal classes. Visit the hospital and tour the maternity area. Buy a rear-facing car seat. Learn how to install it in your car. Prepare the baby's room. Take out all pillows and stuffed animals from the baby's crib. General instructions Avoid cat litter boxes and soil used by cats. These carry germs that can cause harm to the baby and can cause a loss of your baby by miscarriage or stillbirth. Do not douche or use tampons. Do not use scented sanitary pads. Do not smoke or use any products that contain nicotine or tobacco. If you need help quitting, ask your doctor. Do not drink alcohol. Do not use herbal medicines, illegal drugs, or medicines that were not approved by your doctor. Chemicals in these products can affect your baby. Keep all follow-up visits. This is important. Where to find more information American Pregnancy Association: americanpregnancy.org [employer]: www.acog.org Office on Women's Health: KeywordPortfolios.com.br Contact a doctor if: You have a fever. You have mild cramps or pressure in your lower belly. You have a nagging pain in your belly area. You vomit, or you have watery poop (diarrhea). You have bad-smelling fluid coming from your vagina. You have pain when you pee, or your pee smells bad. You have a headache that does not go away when you take medicine. You have changes in how you see, or you see spots in front of your eyes. Get help right away if: Your water breaks. You have regular contractions that are less than 5 minutes apart. You are spotting or bleeding from your vagina. You have very bad belly cramps or pain. You have trouble breathing. You have chest pain. You faint. You have not felt the baby move for the amount of time told by your doctor. You have new or increased pain, swelling, or redness in an arm or leg. Summary The third trimester is from week 28 through week 40 (months 7  through 9). This is the time when your unborn baby is growing very fast. During this time, your discomfort may increase as you gain weight and as your baby grows. Get ready for your baby to arrive by taking prenatal classes, buying a rear-facing car seat, and preparing the baby's room. Get help right away if you are bleeding from your vagina, you have chest pain and trouble breathing, or you have not felt the baby move for the amount of time told by your doctor. This information is not intended to replace advice given to you by your health care provider. Make sure you discuss any questions you have with your health care provider. Document Revised: 12/15/2019 Document Reviewed: 10/21/2019 Elsevier Patient Education  [place]. Tdap (Tetanus, Diphtheria, Pertussis) Vaccine: What You Need to Know 1. Why get vaccinated? Tdap vaccine can prevent tetanus, diphtheria, and pertussis. Diphtheria and pertussis spread from person to person. Tetanus enters the body through cuts or wounds. TETANUS (T) causes painful stiffening of the muscles. Tetanus can  lead to serious health problems, including being unable to open the mouth, having trouble swallowing and breathing, or death. DIPHTHERIA (D) can lead to difficulty breathing, heart failure, paralysis, or death. PERTUSSIS (aP), also known as "whooping cough," can cause uncontrollable, violent coughing that makes it hard to breathe, eat, or drink. Pertussis can be extremely serious especially in babies and young children, causing pneumonia, convulsions, brain damage, or death. In teens and adults, it can cause weight loss, loss of bladder control, passing out, and rib fractures from severe coughing. 2. Tdap vaccine Tdap is only for children 7 years and older, adolescents, and adults.  Adolescents should receive a single dose of Tdap, preferably at age 62 or 76 years. Pregnant people should get a dose of Tdap during every pregnancy, preferably during the  early part of the third trimester, to help protect the newborn from pertussis. Infants are most at risk for severe, life-threatening complications from pertussis. Adults who have never received Tdap should get a dose of Tdap. Also, adults should receive a booster dose of either Tdap or Td (a different vaccine that protects against tetanus and diphtheria but not pertussis) every 10 years, or after 5 years in the case of a severe or dirty wound or burn. Tdap may be given at the same time as other vaccines. 3. Talk with your health care provider Tell your vaccine provider if the person getting the vaccine: Has had an allergic reaction after a previous dose of any vaccine that protects against tetanus, diphtheria, or pertussis, or has any severe, life-threatening allergies Has had a coma, decreased level of consciousness, or prolonged seizures within 7 days after a previous dose of any pertussis vaccine (DTP, DTaP, or Tdap) Has seizures or another nervous system problem Has ever had Guillain-Barr Syndrome (also called "GBS") Has had severe pain or swelling after a previous dose of any vaccine that protects against tetanus or diphtheria In some cases, your health care provider may decide to postpone Tdap vaccination until a future visit. People with minor illnesses, such as a cold, may be vaccinated. People who are moderately or severely ill should usually wait until they recover before getting Tdap vaccine.  Your health care provider can give you more information. 4. Risks of a vaccine reaction Pain, redness, or swelling where the shot was given, mild fever, headache, feeling tired, and nausea, vomiting, diarrhea, or stomachache sometimes happen after Tdap vaccination. People sometimes faint after medical procedures, including vaccination. Tell your provider if you feel dizzy or have vision changes or ringing in the ears.  As with any medicine, there is a very remote chance of a vaccine causing a  severe allergic reaction, other serious injury, or death. 5. What if there is a serious problem? An allergic reaction could occur after the vaccinated person leaves the clinic. If you see signs of a severe allergic reaction (hives, swelling of the face and throat, difficulty breathing, a fast heartbeat, dizziness, or weakness), call 9-1-1 and get the person to the nearest hospital. For other signs that concern you, call your health care provider.  Adverse reactions should be reported to the Vaccine Adverse Event Reporting System (VAERS). Your health care provider will usually file this report, or you can do it yourself. Visit the VAERS website at www.vaers.SamedayNews.es or call 250-636-1401. VAERS is only for reporting reactions, and VAERS staff members do not give medical advice. 6. The National Vaccine Injury Compensation Program The [employer] Vaccine Injury Compensation Program (VICP) is a Technical brewer that  was created to compensate people who may have been injured by certain vaccines. Claims regarding alleged injury or death due to vaccination have a time limit for filing, which may be as short as two years. Visit the VICP website at GoldCloset.com.ee or call 778-533-0530 to learn about the program and about filing a claim. 7. How can I learn more? Ask your health care provider. Call your local or state health department. Visit the website of the Food and Drug Administration (FDA) for vaccine package inserts and additional information at TraderRating.uy. Contact the Centers for Disease Control and Prevention (CDC): Call 606-201-8462 (1-800-CDC-INFO) or Visit CDC's website at http://hunter.com/. Vaccine Information Statement Tdap (Tetanus, Diphtheria, Pertussis) Vaccine (02/25/2020) This information is not intended to replace advice given to you by your health care provider. Make sure you discuss any questions you have with your health care  provider. Document Revised: 03/22/2020 Document Reviewed: 03/22/2020 Elsevier Patient Education  [place]. Common Medications Safe in Pregnancy  Acne:      Constipation:  Benzoyl Peroxide     Colace  Clindamycin      Dulcolax Suppository  Topica Erythromycin     Fibercon  Salicylic Acid      Metamucil         Miralax AVOID:        Senakot   Accutane    Cough:  Retin-A       Cough Drops  Tetracycline      Phenergan w/ Codeine if Rx  Minocycline      Robitussin (Plain & DM)  Antibiotics:     Crabs/Lice:  Ceclor       RID  Cephalosporins    AVOID:  E-Mycins      Kwell  Keflex  Macrobid/Macrodantin   Diarrhea:  Penicillin      Kao-Pectate  Zithromax      Imodium AD         PUSH FLUIDS AVOID:       Cipro     Fever:  Tetracycline      Tylenol (Regular or Extra  Minocycline       Strength)  Levaquin      Extra Strength-Do not          Exceed 8 tabs/24 hrs Caffeine:        '200mg'$ /day (equiv. To 1 cup of coffee or  approx. 3 12 oz sodas)         Gas: Cold/Hayfever:       Gas-X  Benadryl      Mylicon  Claritin       Phazyme  **Claritin-D        Chlor-Trimeton    Headaches:  Dimetapp      ASA-Free Excedrin  Drixoral-Non-Drowsy     Cold Compress  Mucinex (Guaifenasin)     Tylenol (Regular or Extra  Sudafed/Sudafed-12 Hour     Strength)  **Sudafed PE Pseudoephedrine   Tylenol Cold & Sinus     Vicks Vapor Rub  Zyrtec  **AVOID if Problems With Blood Pressure         Heartburn: Avoid lying down for at least 1 hour after meals  Aciphex      Maalox     Rash:  Milk of Magnesia     Benadryl    Mylanta       1% Hydrocortisone Cream  Pepcid  Pepcid Complete   Sleep Aids:  Prevacid      Ambien   Prilosec       Benadryl  Benadryl  Rolaids       Chamomile Tea  Tums (Limit 4/day)     Unisom         Tylenol PM         Warm milk-add vanilla or  Hemorrhoids:       Sugar for taste  Anusol/Anusol H.C.  (RX: Analapram 2.5%)  Sugar Substitutes:  Hydrocortisone OTC     Ok in  moderation  Preparation H      Tucks        Vaseline lotion applied to tissue with wiping    Herpes:     Throat:  Acyclovir      Oragel  Famvir  Valtrex     Vaccines:         Flu Shot Leg Cramps:       *Gardasil  Benadryl      Hepatitis A         Hepatitis B Nasal Spray:       Pneumovax  Saline Nasal Spray     Polio Booster         Tetanus Nausea:       Tuberculosis test or PPD  Vitamin B6 25 mg TID   AVOID:    Dramamine      *Gardasil  Emetrol       Live Poliovirus  Ginger Root 250 mg QID    MMR (measles, mumps &  High Complex Carbs @ Bedtime    rebella)  Sea Bands-Accupressure    Varicella (Chickenpox)  Unisom 1/2 tab TID     *No known complications           If received before Pain:         Known pregnancy;   Darvocet       Resume series after  Lortab        Delivery  Percocet    Yeast:   Tramadol      Femstat  Tylenol 3      Gyne-lotrimin  Ultram       Monistat  Vicodin           MISC:         All Sunscreens           Hair Coloring/highlights          Insect Repellant's          (Including DEET)         Mystic Tans  

## 2021-03-27 NOTE — Progress Notes (Signed)
ROB: Complains of feeling light-headed and dizzy. Notes that it happens sporadic, not necessarily with position changes.  Is hydrating well, will check iron levels.  For 28 week labs today.  Plans to  plans to breastfeed, desires Abstinence for contraception. For Tdap today, signed blood consent.  Discussed pain management in labor, desires epidural. Size>dates, will get growth scan at 32 weeks (also for obesity in pregnancy). Also desires letter for the dentist as she plans on having work done to pull a bad tooth on 9/13. Letter provided. RTC in 2 weeks.

## 2021-03-29 LAB — RPR, QUANT+TP ABS (REFLEX)
Rapid Plasma Reagin, Quant: 1:2 {titer} — ABNORMAL HIGH
T Pallidum Abs: NONREACTIVE

## 2021-03-29 LAB — RPR: RPR Ser Ql: REACTIVE — AB

## 2021-03-29 LAB — CBC WITH DIFF/PLATELET
Basophils Absolute: 0 10*3/uL (ref 0.0–0.2)
Basos: 0 %
EOS (ABSOLUTE): 0.1 10*3/uL (ref 0.0–0.4)
Eos: 1 %
Hematocrit: 34.2 % (ref 34.0–46.6)
Hemoglobin: 11.1 g/dL (ref 11.1–15.9)
Immature Grans (Abs): 0.1 10*3/uL (ref 0.0–0.1)
Immature Granulocytes: 1 %
Lymphocytes Absolute: 1.7 10*3/uL (ref 0.7–3.1)
Lymphs: 16 %
MCH: 27.3 pg (ref 26.6–33.0)
MCHC: 32.5 g/dL (ref 31.5–35.7)
MCV: 84 fL (ref 79–97)
Monocytes Absolute: 1.3 10*3/uL — ABNORMAL HIGH (ref 0.1–0.9)
Monocytes: 12 %
Neutrophils Absolute: 7.3 10*3/uL — ABNORMAL HIGH (ref 1.4–7.0)
Neutrophils: 70 %
Platelets: 235 10*3/uL (ref 150–450)
RBC: 4.06 x10E6/uL (ref 3.77–5.28)
RDW: 12.7 % (ref 11.7–15.4)
WBC: 10.6 10*3/uL (ref 3.4–10.8)

## 2021-03-29 LAB — GLUCOSE TOLERANCE, 1 HOUR: Glucose, 1Hr PP: 97 mg/dL (ref 65–199)

## 2021-04-06 ENCOUNTER — Other Ambulatory Visit: Payer: Medicaid Other

## 2021-04-12 ENCOUNTER — Encounter: Payer: Self-pay | Admitting: Obstetrics and Gynecology

## 2021-04-12 ENCOUNTER — Ambulatory Visit (INDEPENDENT_AMBULATORY_CARE_PROVIDER_SITE_OTHER): Payer: Medicaid Other | Admitting: Obstetrics and Gynecology

## 2021-04-12 ENCOUNTER — Other Ambulatory Visit: Payer: Medicaid Other

## 2021-04-12 ENCOUNTER — Other Ambulatory Visit: Payer: Self-pay

## 2021-04-12 ENCOUNTER — Ambulatory Visit (INDEPENDENT_AMBULATORY_CARE_PROVIDER_SITE_OTHER): Payer: Medicaid Other

## 2021-04-12 VITALS — BP 119/75 | HR 98 | Wt 233.5 lb

## 2021-04-12 DIAGNOSIS — O26849 Uterine size-date discrepancy, unspecified trimester: Secondary | ICD-10-CM

## 2021-04-12 DIAGNOSIS — Z3483 Encounter for supervision of other normal pregnancy, third trimester: Secondary | ICD-10-CM

## 2021-04-12 DIAGNOSIS — Z3A30 30 weeks gestation of pregnancy: Secondary | ICD-10-CM

## 2021-04-12 LAB — POCT URINALYSIS DIPSTICK OB
Bilirubin, UA: NEGATIVE
Blood, UA: NEGATIVE
Glucose, UA: NEGATIVE
Ketones, UA: NEGATIVE
Leukocytes, UA: NEGATIVE
Nitrite, UA: NEGATIVE
POC,PROTEIN,UA: NEGATIVE
Spec Grav, UA: 1.015 (ref 1.010–1.025)
Urobilinogen, UA: 0.2 E.U./dL
pH, UA: 7 (ref 5.0–8.0)

## 2021-04-12 NOTE — Progress Notes (Signed)
ROB: Says that she just feels tired.  Taking vitamins and aspirin as directed.  Still has occasional nausea vomiting.  Plan to begin NSTs at 34 weeks. (Elevated BMI)

## 2021-04-26 ENCOUNTER — Other Ambulatory Visit: Payer: Self-pay

## 2021-04-26 ENCOUNTER — Encounter: Payer: Self-pay | Admitting: Obstetrics and Gynecology

## 2021-04-26 ENCOUNTER — Ambulatory Visit (INDEPENDENT_AMBULATORY_CARE_PROVIDER_SITE_OTHER): Payer: Medicaid Other | Admitting: Obstetrics and Gynecology

## 2021-04-26 ENCOUNTER — Ambulatory Visit (INDEPENDENT_AMBULATORY_CARE_PROVIDER_SITE_OTHER): Payer: Medicaid Other

## 2021-04-26 VITALS — BP 115/76 | HR 106 | Wt 239.3 lb

## 2021-04-26 DIAGNOSIS — O26843 Uterine size-date discrepancy, third trimester: Secondary | ICD-10-CM

## 2021-04-26 DIAGNOSIS — Z3A32 32 weeks gestation of pregnancy: Secondary | ICD-10-CM

## 2021-04-26 DIAGNOSIS — O99212 Obesity complicating pregnancy, second trimester: Secondary | ICD-10-CM

## 2021-04-26 DIAGNOSIS — O09299 Supervision of pregnancy with other poor reproductive or obstetric history, unspecified trimester: Secondary | ICD-10-CM

## 2021-04-26 DIAGNOSIS — Z3483 Encounter for supervision of other normal pregnancy, third trimester: Secondary | ICD-10-CM

## 2021-04-26 LAB — POCT URINALYSIS DIPSTICK OB
Bilirubin, UA: NEGATIVE
Blood, UA: NEGATIVE
Glucose, UA: NEGATIVE
Ketones, UA: NEGATIVE
Leukocytes, UA: NEGATIVE
Nitrite, UA: NEGATIVE
POC,PROTEIN,UA: NEGATIVE
Spec Grav, UA: 1.02 (ref 1.010–1.025)
Urobilinogen, UA: 0.2 E.U./dL
pH, UA: 6 (ref 5.0–8.0)

## 2021-04-26 NOTE — Progress Notes (Signed)
ROB: Notes some irritability, people "getting on her nerves".  GAD-7 score today is 10. Discussed screening results. Discussed that due to increased BMI (currently 43), recommendations for antenatal testing beginning at 36 weeks, IOL by 40 weeks. Also discussed that if BMI crosses >45, will need Anesthesia consult. For growth scan today, also size> dates today. RTC in 2 weeks.

## 2021-04-26 NOTE — Progress Notes (Signed)
ROB: She is doing well, no concerns today. 

## 2021-05-01 ENCOUNTER — Telehealth: Payer: Self-pay | Admitting: Obstetrics and Gynecology

## 2021-05-01 NOTE — Telephone Encounter (Signed)
Pt called back - pt still feeling lightheaded, states a migraine since yesterday, now vomiting and liquid bowel movements. Pt denies fever and is trying to treat with tylenol. Please Advise.

## 2021-05-03 ENCOUNTER — Other Ambulatory Visit: Payer: Self-pay

## 2021-05-03 ENCOUNTER — Observation Stay
Admission: EM | Admit: 2021-05-03 | Discharge: 2021-05-03 | Disposition: A | Discharge status: Home / Self Care | Payer: Medicaid Other | Attending: Obstetrics and Gynecology | Admitting: Obstetrics and Gynecology

## 2021-05-03 ENCOUNTER — Encounter: Payer: Self-pay | Admitting: Obstetrics and Gynecology

## 2021-05-03 DIAGNOSIS — Z3A33 33 weeks gestation of pregnancy: Secondary | ICD-10-CM | POA: Insufficient documentation

## 2021-05-03 DIAGNOSIS — Z7982 Long term (current) use of aspirin: Secondary | ICD-10-CM | POA: Diagnosis not present

## 2021-05-03 DIAGNOSIS — O99211 Obesity complicating pregnancy, first trimester: Secondary | ICD-10-CM

## 2021-05-03 DIAGNOSIS — O0991 Supervision of high risk pregnancy, unspecified, first trimester: Secondary | ICD-10-CM

## 2021-05-03 DIAGNOSIS — O99213 Obesity complicating pregnancy, third trimester: Secondary | ICD-10-CM | POA: Insufficient documentation

## 2021-05-03 DIAGNOSIS — R109 Unspecified abdominal pain: Secondary | ICD-10-CM | POA: Diagnosis not present

## 2021-05-03 DIAGNOSIS — R55 Syncope and collapse: Secondary | ICD-10-CM | POA: Diagnosis present

## 2021-05-03 DIAGNOSIS — O99353 Diseases of the nervous system complicating pregnancy, third trimester: Secondary | ICD-10-CM | POA: Diagnosis present

## 2021-05-03 DIAGNOSIS — Z91040 Latex allergy status: Secondary | ICD-10-CM | POA: Insufficient documentation

## 2021-05-03 DIAGNOSIS — R569 Unspecified convulsions: Secondary | ICD-10-CM | POA: Diagnosis not present

## 2021-05-03 DIAGNOSIS — O26893 Other specified pregnancy related conditions, third trimester: Secondary | ICD-10-CM

## 2021-05-03 DIAGNOSIS — R102 Pelvic and perineal pain: Secondary | ICD-10-CM

## 2021-05-03 DIAGNOSIS — E669 Obesity, unspecified: Secondary | ICD-10-CM | POA: Diagnosis not present

## 2021-05-03 DIAGNOSIS — O10013 Pre-existing essential hypertension complicating pregnancy, third trimester: Secondary | ICD-10-CM | POA: Insufficient documentation

## 2021-05-03 DIAGNOSIS — Z01818 Encounter for other preprocedural examination: Secondary | ICD-10-CM | POA: Diagnosis not present

## 2021-05-03 LAB — COMPREHENSIVE METABOLIC PANEL
ALT: 17 U/L (ref 0–44)
AST: 15 U/L (ref 15–41)
Albumin: 2.8 g/dL — ABNORMAL LOW (ref 3.5–5.0)
Alkaline Phosphatase: 77 U/L (ref 38–126)
Anion gap: 8 (ref 5–15)
BUN: 7 mg/dL (ref 6–20)
CO2: 27 mmol/L (ref 22–32)
Calcium: 8.8 mg/dL — ABNORMAL LOW (ref 8.9–10.3)
Chloride: 102 mmol/L (ref 98–111)
Creatinine, Ser: 0.43 mg/dL — ABNORMAL LOW (ref 0.44–1.00)
GFR, Estimated: >60 mL/min (ref >60)
Glucose, Bld: 85 mg/dL (ref 70–99)
Potassium: 4.1 mmol/L (ref 3.5–5.1)
Sodium: 137 mmol/L (ref 135–145)
Total Bilirubin: 0.4 mg/dL (ref 0.3–1.2)
Total Protein: 6.8 g/dL (ref 6.5–8.1)

## 2021-05-03 LAB — CBC
HCT: 36 % (ref 36.0–46.0)
Hemoglobin: 11.5 g/dL — ABNORMAL LOW (ref 12.0–15.0)
MCH: 25.9 pg — ABNORMAL LOW (ref 26.0–34.0)
MCHC: 31.9 g/dL (ref 30.0–36.0)
MCV: 81.1 fL (ref 80.0–100.0)
Platelets: 245 10*3/uL (ref 150–400)
RBC: 4.44 MIL/uL (ref 3.87–5.11)
RDW: 14.3 % (ref 11.5–15.5)
WBC: 13.4 10*3/uL — ABNORMAL HIGH (ref 4.0–10.5)
nRBC: 0 % (ref 0.0–0.2)

## 2021-05-03 LAB — URINALYSIS, COMPLETE (UACMP) WITH MICROSCOPIC
Bilirubin Urine: NEGATIVE
Glucose, UA: NEGATIVE mg/dL
Hgb urine dipstick: NEGATIVE
Ketones, ur: NEGATIVE mg/dL
Nitrite: NEGATIVE
Protein, ur: NEGATIVE mg/dL
Specific Gravity, Urine: 1.003 — ABNORMAL LOW (ref 1.005–1.030)
pH: 6 (ref 5.0–8.0)

## 2021-05-03 MED ORDER — LACTATED RINGERS IV BOLUS: 500.0000 mL | Freq: Once | INTRAVENOUS | Status: DC

## 2021-05-03 MED ORDER — ACETAMINOPHEN-CODEINE #3 300-30 MG PO TABS
1.0000 | ORAL_TABLET | ORAL | Status: DC | PRN
  Administered 2021-05-03: 1 via ORAL
  Filled 2021-05-03: qty 1

## 2021-05-03 NOTE — Final Progress Note (Signed)
L&D OB Triage Note  HPI:  Mariah Richardson is a 35 y.o. [phone] female at [redacted]w[redacted]d. Estimated Date of Delivery: 06/19/21 who presents for complaints of syncopal episode today.  Notes that this is the second episode in a span of a week. Has never had episodes like this before. Notes feeling flushed. Drinks plenty of fluids and eat regularly. Notes that she does feel palpitations prior to an episode. Reports feelings of nausea afterwards and sometimes vomits.   She also complaints of pelvic pain, 6/10.  Has taken Tylenol at home without relief.  Pain has been ongoing for several weeks. Denies urinary symptoms and notes normal bowel movements. Denies leaking of fluids, vaginal bleeding/discharge, contractions, and notes good fetal movement.    OB History  Gravida Para Term Preterm AB Living  5 3 3   1 3   SAB IAB Ectopic Multiple Live Births        0 3    # Outcome Date GA Lbr Len/2nd Weight Sex Delivery Anes PTL Lv  5 Current           4 Term 01/05/18 [redacted]w[redacted]d / 00:46 3070 g F Vag-Spont EPI  LIV     Complications: Pre-eclampsia  3 Term 02/10/12 [redacted]w[redacted]d  3374 g M Vag-Spont   LIV  2 AB 2008          1 Term 05/10/06 [redacted]w[redacted]d  3459 g F Vag-Spont   LIV     Complications: Preeclampsia    Patient Active Problem List   Diagnosis Date Noted   Syncopal episodes 05/03/2021   Supervision of high risk pregnancy in first trimester 11/06/2020   Obesity affecting pregnancy in first trimester 11/06/2020   Chronic migraine 07/30/2019   History of marijuana use 06/30/2017   Anxiety 10/03/2015    Past Medical History:  Diagnosis Date   Gastritis    Hypertension     No current facility-administered medications on file prior to encounter.   Current Outpatient Medications on File Prior to Encounter  Medication Sig Dispense Refill   acetaminophen (TYLENOL) 500 MG tablet Take 500 mg by mouth every 6 (six) hours as needed.     aspirin 81 MG chewable tablet Chew by mouth daily.     Pediatric  Multivit-Minerals-C (FLINTSTONES GUMMIES COMPLETE PO) Take by mouth.      Allergies  Allergen Reactions   Amoxicillin    Latex    Penicillins      ROS:  Review of Systems - Negative except what is noted in HPI.    Physical Exam:  Blood pressure (!) 127/56, pulse (!) 111, temperature 98.4 F (36.9 C), temperature source Oral, resp. rate 15, last menstrual period 09/12/2020. General appearance: alert and no distress CVS exam: mild tachycardia, regular rhythm, normal S1, S2, no murmurs, rubs, clicks or gallops. Lungs: Normal respiratory effort,,clear to auscultation, no crackles or wheezes.  Abdomen: soft, non-tender; bowel sounds normal; no masses,  no organomegaly Pelvic: deferred Extremities: extremities normal, atraumatic, no cyanosis or edema   NST INTERPRETATION: Indications: rule out uterine contractions  Mode: External Baseline Rate (A): 140 bpm Variability: Moderate Accelerations: 15 x 15 Decelerations: None     Contraction Frequency (min): Rare  Impression: reactive   Labs:  Results for orders placed or performed during the hospital encounter of 05/03/21  CBC  Result Value Ref Range   WBC 13.4 (H) 4.0 - 10.5 K/uL   RBC 4.44 3.87 - 5.11 MIL/uL   Hemoglobin 11.5 (L) 12.0 - 15.0 g/dL  HCT 36.0 36.0 - 46.0 %   MCV 81.1 80.0 - 100.0 fL   MCH 25.9 (L) 26.0 - 34.0 pg   MCHC 31.9 30.0 - 36.0 g/dL   RDW 46.9 62.9 - 52.8 %   Platelets 245 150 - 400 K/uL   nRBC 0.0 0.0 - 0.2 %  Comprehensive metabolic panel  Result Value Ref Range   Sodium 137 135 - 145 mmol/L   Potassium 4.1 3.5 - 5.1 mmol/L   Chloride 102 98 - 111 mmol/L   CO2 27 22 - 32 mmol/L   Glucose, Bld 85 70 - 99 mg/dL   BUN 7 6 - 20 mg/dL   Creatinine, Ser 4.13 (L) 0.44 - 1.00 mg/dL   Calcium 8.8 (L) 8.9 - 10.3 mg/dL   Total Protein 6.8 6.5 - 8.1 g/dL   Albumin 2.8 (L) 3.5 - 5.0 g/dL   AST 15 15 - 41 U/L   ALT 17 0 - 44 U/L   Alkaline Phosphatase 77 38 - 126 U/L   Total Bilirubin 0.4 0.3 -  1.2 mg/dL   GFR, Estimated >24 >40 mL/min   Anion gap 8 5 - 15  Urinalysis, Complete w Microscopic Urine, Clean Catch  Result Value Ref Range   Color, Urine YELLOW (A) YELLOW   APPearance CLOUDY (A) CLEAR   Specific Gravity, Urine 1.003 (L) 1.005 - 1.030   pH 6.0 5.0 - 8.0   Glucose, UA NEGATIVE NEGATIVE mg/dL   Hgb urine dipstick NEGATIVE NEGATIVE   Bilirubin Urine NEGATIVE NEGATIVE   Ketones, ur NEGATIVE NEGATIVE mg/dL   Protein, ur NEGATIVE NEGATIVE mg/dL   Nitrite NEGATIVE NEGATIVE   Leukocytes,Ua TRACE (A) NEGATIVE   RBC / HPF 0-5 0 - 5 RBC/hpf   WBC, UA 21-50 0 - 5 WBC/hpf   Bacteria, UA MANY (A) NONE SEEN   Squamous Epithelial / LPF 0-5 0 - 5    EKG: normal except sinus tachycardia  Assessment:  35 y.o. [id number] at [redacted]w[redacted]d with:  1.  Syncopal episodes 2.  Pelvic pain   Plan:  1. Normal workup thus far. Discussed potential causes for syncopal episodes.  Would recommend evaluation by Cardiology to pursue further. Will place referral. Advised on fall precautions. 2. Pelvic pain in pregnancy, no relief with Tylenol at home. Given dose of Tylenol #3 with no improvement in symptoms. Patient desired to d/c home and sleep off medication.  Will also send home with abdominal binder for use as belly band.   Hildred Laser, MD [facility]

## 2021-05-03 NOTE — OB Triage Note (Signed)
Pt. presents to Labor & Delivery today with complaints of loss of consciousness, headache, dizziness, lightheadedness, and lower abdominal pain. Pt. states that all of these symptoms began on 10/07 and have been ongoing since that time. Today, the pt. reports that she was working from home when she began to feel lightheaded and dizzy. Her babysitter reports that she lost consciousness shortly thereafter, hitting her head on the floor. She rates her headache pain as 10/10 and constant. She rates her lower abdominal pain as 10/10, intermittent, and sharp/shooting. She states that she also lost consciousness on 10/7, but is not sure how she landed at that time. She denies vaginal bleeding, LOF, and ctx; reports positive fetal movement. External [place] and Toco applied Initial FHR 145bpm. All vital signs WNL. Will continue to assess.

## 2021-05-05 LAB — URINE CULTURE

## 2021-05-09 ENCOUNTER — Other Ambulatory Visit: Payer: Self-pay

## 2021-05-09 ENCOUNTER — Ambulatory Visit (INDEPENDENT_AMBULATORY_CARE_PROVIDER_SITE_OTHER): Payer: Medicaid Other | Admitting: Obstetrics and Gynecology

## 2021-05-09 ENCOUNTER — Encounter: Payer: Self-pay | Admitting: Obstetrics and Gynecology

## 2021-05-09 VITALS — BP 115/77 | HR 105 | Wt 241.3 lb

## 2021-05-09 DIAGNOSIS — Z3483 Encounter for supervision of other normal pregnancy, third trimester: Secondary | ICD-10-CM

## 2021-05-09 DIAGNOSIS — Z3A34 34 weeks gestation of pregnancy: Secondary | ICD-10-CM

## 2021-05-09 LAB — POCT URINALYSIS DIPSTICK OB
Bilirubin, UA: NEGATIVE
Blood, UA: NEGATIVE
Glucose, UA: NEGATIVE
Ketones, UA: NEGATIVE
Leukocytes, UA: NEGATIVE
Nitrite, UA: NEGATIVE
Spec Grav, UA: 1.025 (ref 1.010–1.025)
Urobilinogen, UA: 0.2 E.U./dL
pH, UA: 6 (ref 5.0–8.0)

## 2021-05-09 NOTE — Progress Notes (Signed)
ROB: Feels tired.  Is scheduled to see a cardiologist for occasional "passing out".  It has been happening less frequently but still happens.  Ultrasound results reviewed.  Cultures next visit.  NST next visit.  Taking aspirin as directed.

## 2021-05-09 NOTE — Progress Notes (Signed)
ROB: She feels very tired today, no new concerns.

## 2021-05-14 ENCOUNTER — Observation Stay
Admission: EM | Admit: 2021-05-14 | Discharge: 2021-05-14 | Disposition: A | Discharge status: Home / Self Care | Payer: Medicaid Other | Attending: Obstetrics and Gynecology | Admitting: Obstetrics and Gynecology

## 2021-05-14 DIAGNOSIS — O26893 Other specified pregnancy related conditions, third trimester: Secondary | ICD-10-CM | POA: Diagnosis present

## 2021-05-14 DIAGNOSIS — O219 Vomiting of pregnancy, unspecified: Secondary | ICD-10-CM | POA: Insufficient documentation

## 2021-05-14 DIAGNOSIS — R110 Nausea: Secondary | ICD-10-CM | POA: Diagnosis not present

## 2021-05-14 DIAGNOSIS — O368130 Decreased fetal movements, third trimester, not applicable or unspecified: Secondary | ICD-10-CM | POA: Diagnosis not present

## 2021-05-14 DIAGNOSIS — M549 Dorsalgia, unspecified: Secondary | ICD-10-CM | POA: Diagnosis not present

## 2021-05-14 DIAGNOSIS — Z3A34 34 weeks gestation of pregnancy: Secondary | ICD-10-CM | POA: Diagnosis not present

## 2021-05-14 DIAGNOSIS — R102 Pelvic and perineal pain unspecified side: Secondary | ICD-10-CM

## 2021-05-14 DIAGNOSIS — O0990 Supervision of high risk pregnancy, unspecified, unspecified trimester: Secondary | ICD-10-CM

## 2021-05-14 DIAGNOSIS — R1013 Epigastric pain: Secondary | ICD-10-CM | POA: Insufficient documentation

## 2021-05-14 DIAGNOSIS — O99213 Obesity complicating pregnancy, third trimester: Secondary | ICD-10-CM

## 2021-05-14 DIAGNOSIS — H538 Other visual disturbances: Secondary | ICD-10-CM | POA: Insufficient documentation

## 2021-05-14 DIAGNOSIS — O99891 Other specified diseases and conditions complicating pregnancy: Secondary | ICD-10-CM

## 2021-05-14 MED ORDER — PROMETHAZINE HCL 25 MG PO TABS: 25.0000 mg | ORAL_TABLET | Freq: Four times a day (QID) | ORAL | 0 refills | Status: DC | PRN

## 2021-05-14 MED ORDER — PROMETHAZINE HCL 25 MG/ML IJ SOLN
25.0000 mg | Freq: Once | INTRAMUSCULAR | Status: AC
  Administered 2021-05-14: 25 mg via INTRAMUSCULAR
  Filled 2021-05-14: qty 1

## 2021-05-14 MED ORDER — ONDANSETRON 4 MG PO TBDP
4.0000 mg | ORAL_TABLET | Freq: Once | ORAL | Status: AC
  Administered 2021-05-14: 4 mg via ORAL
  Filled 2021-05-14: qty 1

## 2021-05-14 NOTE — Telephone Encounter (Signed)
Patient states that the pain in her stomach, hips, legs and back makes it hard for her to get up and walk. She is having chest pain that makes it hard for her to breath. I advised patient to go to ED. She verbalized understanding and said she was going to go shortly.  Please advise.

## 2021-05-14 NOTE — Final Progress Note (Signed)
L&D OB Triage Note  Mariah Richardson is a 35 y.o. [id number] female at [redacted]w[redacted]d, EDD Estimated Date of Delivery: 06/19/21 who presented to triage for complaints of nausea, epigastric pain, blurred vision, and decreased fetal movement x 1  day. She also has a history of syncopal episodes beginning this month, pending evaluation by Cardiology as workup so far has been negative.  She was evaluated by the nurses with no significant findings. Vital signs stable. An NST was performed and has been reviewed by MD. She was treated with PO Zofran which initially did not help with her symptoms, followed by IM Phenergan which improved her nausea.  Patient then reported symptoms of 8/10 back pain, however declined treatment.    Physical Exam:  Vitals:   05/14/21 1641 05/14/21 1657 05/14/21 1712 05/14/21 1727  BP: 133/77 133/66 106/64 (!) 113/47  Pulse: (!) 101 93 91 90  Resp:      Temp:      TempSrc:       Remainder of physical exam deferred.   NST INTERPRETATION: Indications: decreased fetal movement  Mode: External Baseline Rate (A): 130 bpm Variability: Moderate Accelerations: 15 x 15 Decelerations: None     Contraction Frequency (min): U/I  Impression: reactive   Plan: NST performed was reviewed and was found to be reactive. She was discharged home with a prescription for Phenergan.  Will also refer to physical therapy as back pain (and pelvic pain), likely secondary to fetal positioning (has previously been worked up for other causes).  Encourage to utilize belly band. Continue routine prenatal care. Follow up with OB/GYN as previously scheduled. To f/u with Cardiology.     Hildred Laser, MD [facility]

## 2021-05-14 NOTE — Progress Notes (Signed)
Patient states zofran did not help. RN called Dr Valentino Saxon and received an order for IM phenergan 25 mg once.

## 2021-05-14 NOTE — OB Triage Note (Addendum)
Patient c/o passing out episodes that started back at the beginning of the month. Upper Epigastric pain and blurry vision with halos present. NVD present also She also mentions decreased fetal movement. Fetal Strip Cat 1

## 2021-05-15 ENCOUNTER — Ambulatory Visit: Payer: Medicaid Other | Attending: Obstetrics and Gynecology | Admitting: Physical Therapy

## 2021-05-15 ENCOUNTER — Encounter: Payer: Self-pay | Admitting: Physical Therapy

## 2021-05-15 ENCOUNTER — Other Ambulatory Visit: Payer: Self-pay

## 2021-05-15 VITALS — BP 126/87 | HR 88

## 2021-05-15 DIAGNOSIS — O26893 Other specified pregnancy related conditions, third trimester: Secondary | ICD-10-CM | POA: Diagnosis not present

## 2021-05-15 DIAGNOSIS — M549 Dorsalgia, unspecified: Secondary | ICD-10-CM | POA: Insufficient documentation

## 2021-05-15 DIAGNOSIS — R2689 Other abnormalities of gait and mobility: Secondary | ICD-10-CM | POA: Insufficient documentation

## 2021-05-15 DIAGNOSIS — O99891 Other specified diseases and conditions complicating pregnancy: Secondary | ICD-10-CM | POA: Diagnosis not present

## 2021-05-15 DIAGNOSIS — R102 Pelvic and perineal pain: Secondary | ICD-10-CM | POA: Insufficient documentation

## 2021-05-15 DIAGNOSIS — M62838 Other muscle spasm: Secondary | ICD-10-CM | POA: Diagnosis not present

## 2021-05-15 DIAGNOSIS — R279 Unspecified lack of coordination: Secondary | ICD-10-CM | POA: Insufficient documentation

## 2021-05-15 NOTE — Patient Instructions (Signed)
Body mechanics:   Proper body mechanics with getting out of a chair to decrease strain  on back &pelvic floor   Avoid holding your breath when Getting out of the chair:  Scoot to front part of chair chair Heels behind feet, feet are hip width apart, nose over toes  Inhale like you are smelling roses Exhale to stand   __    getting into/out of  bed:    Avoid straining pelvic floor, abdominal muscles , spine  Use log rolling technique instead of getting out of bed with your neck or the sit-up     Log rolling into and out of bed   Log rolling into and out of bed If getting out of bed on R side, Bent knees, scoot hips/ shoulder to L  Raise R arm completely overhead, rolling onto armpit  Then lower bent knees to bed to get into complete side lying position  Then drop legs off bed, and push up onto R elbow/forearm, and use L hand to push onto the bed  ROLL OVER , dig feet and elbows in and scoot hips under, move slowly a little at a time   ____________   Getting into the car, butt first and then push hands into seat or holding handles, lift one leg and knee knees together and move incrementally   Getting out, turn incrementally and then be at 90 deg to steering wheel,  exhale to pull up with hands on parts of the car    ___   Clam Shell 45 Degrees  Lying with hips and knees bent 45, one pillow between knees and ankles. Heel together, toes apart like ballerina,  Lift knee with exhale while pressing heels together. Be sure pelvis does not roll backward. Do not arch back. Do 20 times, each leg, 2 times per day.     Complimentary stretch: [employer] _ foot over _ thigh, opposite knee straight  3 breaths  * Keep pelvis levelled with tactile cue with hand under back of hips  * Slide the ankle of the supporting foot out to decrease the angle which can help level the pelvis

## 2021-05-16 NOTE — Therapy (Signed)
[facility] MAIN [facility] SERVICES [address] [place], [place], [address] Phone: [phone]   Fax:  [phone]  Physical Therapy Evaluation  Patient Details  Name: Mariah Richardson MRN: [id number] Date of Birth: 07-16-1986 Referring Provider (PT): Valentino Saxon MD   Encounter Date: 05/15/2021   PT End of Session - 05/15/21 1616     Visit Number 1    Number of Visits 6   Date for PT Re-Evaluation 06/27/21   PT Start Time 1603    PT Stop Time 1700    PT Time Calculation (min) 57 min             Past Medical History:  Diagnosis Date   Gastritis    Hypertension     Past Surgical History:  Procedure Laterality Date   APPENDECTOMY     CHOLECYSTECTOMY      Vitals:   05/15/21 1617  BP: 126/87  Pulse: 88      Subjective Assessment - 05/15/21 1610     Subjective 1) Pt has had LBP at 4 months of her pregnancy with her 4th child. Pt's due date is 11/22/ 22.   Pain travels down back of leg down RLE sometimes. LBP occurs anytime with getting out of car/ bed, lying in bed. Pt has to sit down to do chores.  Pt had LBP with all past pregnancies. 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 Pt feels pressure at the pelvic area.   2) Leakage with coughing, laughing, sneezing. Pt is able to make it to the bathroom on time.  3) Diarrhea dueing pregnancy but normal stools prior to pregnacy. Daily fluid intake: water 72 fl oz, 2 cups of coffee since getting pregnancy. Pt eats 3 meals  a day with snacks.    3) B hip pain limits her from lying on her sides. It is hard to sleep. Pt sits up on her bed on pillows but can't get a good position to fall asleep.    Pertinent History  Pt went to the ER yesterday due to SOB, pain located at low back and upper belly and lower belly and when getting out of bed, passing out.  Pt was put medications for NA and was referred to PT for LBP and pelvic pain.  3 vaginal deliveries. 1 abortion.     Patient Stated Goals Get comfortable and not hurting when walking or doing something                [facility] PT Assessment - 05/16/21 1113       Assessment   Medical Diagnosis Back pain affecting pregnancy in third trimester    Referring Provider (PT) Valentino Saxon MD      Precautions   Precautions None      Restrictions   Weight Bearing Restrictions No      Balance Screen   Has the patient fallen in the past 6 months No      AROM   Overall AROM Comments R sideflexion with LBP without radiating pain, WNL range of motion      Strength   Overall Strength Comments BLE 5/5      Palpation   SI assessment  L iliac crest higher, R shoulder higher, Post Tx: levelled pelvic girdle , R shoulder still higher      Ambulation/Gait   Gait velocity preTx: 0.8 m/s Post Tx: 1.07 m/s    Gait Comments decreased R  stance phase, slight L trunk shift                        Objective measurements completed on examination: See above findings.       [facility] Adult PT Treatment/Exercise - 05/15/21 1636       Therapeutic Activites    Other Therapeutic Activities discussed antaomy/ physiology to deep core, sitting posture, navoid standing with more weight on one leg to minimize uneven pelvic girdle      Neuro Re-ed    Neuro Re-ed Details  cued for sit to stand, getting out of bed, log rolling , getting out of car techniques      Manual Therapy   Manual therapy comments gentle rocking, low grade distraction RLE, gentle rocking to mobilize glut med/ flank to lower iliacc rest                          PT Long Term Goals - 05/16/21 1112       PT LONG TERM GOAL #1   Title Pt will demo levelled pelvic girdle and shoulders across 2 weeks in order to walk and progress to deep core exercises    Baseline L iliac crest higher/ shoulder    Time 2    Period Weeks    Status New    Target Date 05/30/21      PT LONG TERM GOAL #2   Title Pt will demo no LBP with R  lumbar sideflexion across 2 visits in order to return to walking, standing with longer endurance and to care for her children    Time 2    Period Weeks    Status New    Target Date 05/30/21      PT LONG TERM GOAL #3   Title Pt will be IND with deep core coordination/ strengthening HEP in order to promote pelvic girdle / lumbar stability    Baseline no IND    Time 4    Period Weeks    Status New    Target Date 06/13/21      PT LONG TERM GOAL #4   Title Pt will be IND with body mechanics with get in/out bed and car, log rolling, bending to minimize pain and to perform ADLs    Time 6    Period Weeks    Status New    Target Date 06/27/21      PT LONG TERM GOAL #5   Title Pt will report being able to sleep on her sides with < 50% less hip pain in order to improve sleep quality and health during her pregnancy    Time 6    Period Weeks    Status New    Target Date 06/27/21      Additional Long Term Goals   Additional Long Term Goals Yes      PT LONG TERM GOAL #6   Title Pt will increase her gait speed to > 1.0 m/s in order to ambulate safely and demo imrpove dpelvic girdle stability for ambulation    Baseline 0.8 m/s    Time 4    Period Weeks    Status New    Target Date 06/13/21                    Plan - 05/15/21 1616     Clinical Impression Statement  Pt is a  35  yo  who is due with her 4th child on 11/22/ 22. Pt c/o LBP, B hip pain, SUI, and pelvic pain which impact her ADLs and QOL.   Pt's musculoskeletal assessment revealed uneven pelvic girdle and shoulder height, pain with R sideflexion, dyscoordination and strength of pelvic floor mm, slowed gait, and poor body mechanics which places strain on the abdominal/pelvic floor mm.   These are deficits that indicate an ineffective intraabdominal pressure system associated with increased risk for pt's Sx.   Pt was provided education on etiology of Sx with anatomy, physiology explanation with images along with the  benefits of customized pelvic PT Tx based on pt's medical conditions and musculoskeletal deficits.  Explained the physiology of deep core mm coordination and roles of pelvic floor function in urination, defecation, sexual function, and postural control with deep core mm system.   Following Tx today which pt tolerated without complaints, pt demo'd equal alignment of pelvic girdle, increased gait speed. Pt reported decreased LBP from 7-8/10 to 2/10 after session.   Pt was educated on proper body mechanics especially with getting out of bed. This body mechanics education will hopefully help pt decrease her ER visits as pt was in the hospital yesterday due to SOB, pain located at low back and upper belly and lower belly  when getting out of bed and passing out. Physical therapist inquired the method pt used to get out of bed which was a sit up movement. Pt was educated on logrolling technique which will place less strain on her spine/ pelvic floor and abdomen and minimize breahholding.  Pt was also educated on body mechanics with sit-to-stand, getting in/out of her car, and rolling in bed which includes functional activities which she reported having pain with. Pt demo'd proper technique after education.  Pt benefits from skilled PT.     Personal Factors and Comorbidities Comorbidity 2    Examination-Activity Limitations Lift;Locomotion Level;Sleep;Continence;Stairs;Toileting;Stand;Bend    Stability/Clinical Decision Making Evolving/Moderate complexity    Clinical Decision Making Moderate    Rehab Potential Good    PT Frequency 1x / week    PT Duration 6 weeks   10   PT Treatment/Interventions Neuromuscular re-education;Moist Heat;Therapeutic activities;Therapeutic exercise;Patient/family education;Manual techniques;Stair training;Gait training;Scar mobilization;Taping;Energy conservation;Joint Manipulations    Consulted and Agree with Plan of Care Patient             Patient will benefit from  skilled therapeutic intervention in order to improve the following deficits and impairments:  Decreased activity tolerance, Decreased endurance, Decreased range of motion, Cardiopulmonary status limiting activity, Decreased strength, Decreased mobility, Decreased coordination, Abnormal gait, Improper body mechanics, Pain, Increased muscle spasms, Hypomobility, Difficulty walking, Decreased safety awareness, Hypermobility, Postural dysfunction  Visit Diagnosis: Other abnormalities of gait and mobility  Unspecified lack of coordination  Other muscle spasm  Pain of pelvic girdle     Problem List Patient Active Problem List   Diagnosis Date Noted   Indication for care in labor or delivery 05/14/2021   Back pain affecting pregnancy in third trimester 05/14/2021   Nausea 05/14/2021   Decreased fetal movements in third trimester    Syncopal episodes 05/03/2021   Pelvic pain in pregnancy, antepartum, third trimester 05/03/2021   [redacted] weeks gestation of pregnancy    Supervision of high risk pregnancy, antepartum 11/06/2020   Obesity in pregnancy, antepartum, third trimester 11/06/2020   Chronic migraine 07/30/2019   History of marijuana use 06/30/2017   Anxiety 10/03/2015    Mariane Masters, PT 05/16/2021, 11:30 AM  [facility] MAIN [facility] SERVICES [address] [place], [place], [address] Phone: [phone]   Fax:  [phone]  Name: Mariah Richardson MRN: [id number] Date of Birth: [birth year]

## 2021-05-23 ENCOUNTER — Ambulatory Visit: Payer: Medicaid Other | Admitting: Physical Therapy

## 2021-05-24 ENCOUNTER — Other Ambulatory Visit: Payer: Self-pay

## 2021-05-24 ENCOUNTER — Observation Stay
Admission: EM | Admit: 2021-05-24 | Discharge: 2021-05-24 | Disposition: A | Discharge status: Home / Self Care | Payer: Medicaid Other | Attending: Obstetrics and Gynecology | Admitting: Obstetrics and Gynecology

## 2021-05-24 ENCOUNTER — Encounter: Payer: Self-pay | Admitting: Obstetrics and Gynecology

## 2021-05-24 DIAGNOSIS — R110 Nausea: Secondary | ICD-10-CM

## 2021-05-24 DIAGNOSIS — O26893 Other specified pregnancy related conditions, third trimester: Principal | ICD-10-CM | POA: Insufficient documentation

## 2021-05-24 DIAGNOSIS — R102 Pelvic and perineal pain: Secondary | ICD-10-CM | POA: Insufficient documentation

## 2021-05-24 DIAGNOSIS — Z3A36 36 weeks gestation of pregnancy: Secondary | ICD-10-CM

## 2021-05-24 NOTE — OB Triage Note (Signed)
Pt arrived to unit with c/o intermittent contractions with varying strength an consistency that started around 2100 on 05/23/2021. Pt states that she feels fetal Movement and that she has had one episode of thin, leaky discharge through the night when getting up to void. Currently undergarments and perineum are dry. Denies any vaginal bleeding. Continue to assess.

## 2021-05-24 NOTE — Final Progress Note (Signed)
L&D OB Triage Note  Mariah Richardson is a 35 y.o. [id number] female at [redacted]w[redacted]d, EDD Estimated Date of Delivery: 06/19/21 who presented to triage for complaints of mild nausea, pelvic pressure.  She was evaluated by the nurses with no significant findings. Vital signs stable. An NST was performed and has been reviewed by MD.   NST INTERPRETATION: Indications: rule out uterine contractions  Mode: External Baseline Rate (A): 130 bpm Variability: Moderate Accelerations: 15 x 15 Decelerations: None     Contraction Frequency (min): UI  Impression: reactive   Plan: NST performed was reviewed and was found to be reactive. She was discharged home with bleeding/labor precautions.  Continue routine prenatal care. Follow up with OB/GYN as previously scheduled.     Hildred Laser, MD [facility]

## 2021-05-29 ENCOUNTER — Ambulatory Visit: Payer: Medicaid Other | Attending: Obstetrics and Gynecology | Admitting: Physical Therapy

## 2021-05-29 NOTE — Progress Notes (Signed)
   OB-Pt present for routine prenatal care. Pt stated fetal movement present; braxton hick contractions present; no vaginal bleeding and no changes in vaginal discharge.     Pt c/o lower/upper abd pain and pressure and vaginal pain/pressure.

## 2021-05-29 NOTE — Patient Instructions (Signed)

## 2021-05-30 ENCOUNTER — Other Ambulatory Visit: Payer: Self-pay

## 2021-05-30 ENCOUNTER — Ambulatory Visit (INDEPENDENT_AMBULATORY_CARE_PROVIDER_SITE_OTHER): Payer: Medicaid Other | Admitting: Obstetrics and Gynecology

## 2021-05-30 ENCOUNTER — Encounter: Payer: Medicaid Other | Admitting: Obstetrics and Gynecology

## 2021-05-30 ENCOUNTER — Encounter: Payer: Self-pay | Admitting: Obstetrics and Gynecology

## 2021-05-30 VITALS — BP 108/54 | HR 95 | Wt 247.7 lb

## 2021-05-30 DIAGNOSIS — Z3A37 37 weeks gestation of pregnancy: Secondary | ICD-10-CM

## 2021-05-30 DIAGNOSIS — O0990 Supervision of high risk pregnancy, unspecified, unspecified trimester: Secondary | ICD-10-CM

## 2021-05-30 DIAGNOSIS — O99213 Obesity complicating pregnancy, third trimester: Secondary | ICD-10-CM

## 2021-05-30 DIAGNOSIS — O26899 Other specified pregnancy related conditions, unspecified trimester: Secondary | ICD-10-CM

## 2021-05-30 DIAGNOSIS — R102 Pelvic and perineal pain: Secondary | ICD-10-CM

## 2021-05-30 LAB — POCT URINALYSIS DIPSTICK OB
Blood, UA: NEGATIVE
Glucose, UA: NEGATIVE
Ketones, UA: NEGATIVE
Leukocytes, UA: NEGATIVE
Nitrite, UA: NEGATIVE
Spec Grav, UA: 1.025 (ref 1.010–1.025)
Urobilinogen, UA: 0.2 E.U./dL
pH, UA: 6 (ref 5.0–8.0)

## 2021-05-30 LAB — OB RESULTS CONSOLE GC/CHLAMYDIA: Gonorrhea: NEGATIVE

## 2021-05-30 NOTE — Progress Notes (Signed)
ROB: Notes continued pelvic pain and vaginal pressure.  Desires to know if she can be induced early. Discussed that due to BMI she could be induced in 39th week, however could not be any earlier than this without other medical indications (DM, HTN, fetal distress).  Patient notes understanding. Ok with IOL at 39 weeks. Will schedule for 06/13/2021 (first available). Discussed need for COVID testing and visitor policy. 36 week cultures performed today. Had recent NST in triage 2 days ago so will not perform today.  RTC in 1 week, with NST.  Given labor precautions.

## 2021-06-01 LAB — STREP GP B NAA: Strep Gp B NAA: NEGATIVE

## 2021-06-02 LAB — GC/CHLAMYDIA PROBE AMP
Chlamydia trachomatis, NAA: NEGATIVE
Neisseria Gonorrhoeae by PCR: NEGATIVE

## 2021-06-06 ENCOUNTER — Encounter: Payer: Medicaid Other | Admitting: Obstetrics and Gynecology

## 2021-06-07 ENCOUNTER — Encounter: Payer: Medicaid Other | Admitting: Obstetrics and Gynecology

## 2021-06-07 DIAGNOSIS — Z3A38 38 weeks gestation of pregnancy: Secondary | ICD-10-CM

## 2021-06-07 DIAGNOSIS — Z3483 Encounter for supervision of other normal pregnancy, third trimester: Secondary | ICD-10-CM

## 2021-06-11 ENCOUNTER — Encounter: Payer: Self-pay | Admitting: Obstetrics and Gynecology

## 2021-06-11 ENCOUNTER — Ambulatory Visit (INDEPENDENT_AMBULATORY_CARE_PROVIDER_SITE_OTHER): Payer: Medicaid Other | Admitting: Obstetrics and Gynecology

## 2021-06-11 ENCOUNTER — Inpatient Hospital Stay: Payer: Medicaid Other | Admitting: Registered Nurse

## 2021-06-11 ENCOUNTER — Inpatient Hospital Stay
Admission: EM | Admit: 2021-06-11 | Discharge: 2021-06-13 | DRG: 807 | Disposition: A | Discharge status: Home / Self Care | Payer: Medicaid Other | Attending: Obstetrics and Gynecology | Admitting: Obstetrics and Gynecology

## 2021-06-11 ENCOUNTER — Other Ambulatory Visit: Payer: Self-pay

## 2021-06-11 VITALS — BP 109/53 | HR 107 | Wt 251.0 lb

## 2021-06-11 DIAGNOSIS — O368330 Maternal care for abnormalities of the fetal heart rate or rhythm, third trimester, not applicable or unspecified: Secondary | ICD-10-CM

## 2021-06-11 DIAGNOSIS — O26893 Other specified pregnancy related conditions, third trimester: Secondary | ICD-10-CM

## 2021-06-11 DIAGNOSIS — E669 Obesity, unspecified: Secondary | ICD-10-CM | POA: Diagnosis not present

## 2021-06-11 DIAGNOSIS — Z3A38 38 weeks gestation of pregnancy: Secondary | ICD-10-CM | POA: Diagnosis not present

## 2021-06-11 DIAGNOSIS — O99213 Obesity complicating pregnancy, third trimester: Secondary | ICD-10-CM

## 2021-06-11 DIAGNOSIS — R768 Other specified abnormal immunological findings in serum: Secondary | ICD-10-CM | POA: Diagnosis present

## 2021-06-11 DIAGNOSIS — R7989 Other specified abnormal findings of blood chemistry: Secondary | ICD-10-CM

## 2021-06-11 DIAGNOSIS — O99214 Obesity complicating childbirth: Principal | ICD-10-CM | POA: Diagnosis present

## 2021-06-11 DIAGNOSIS — O0990 Supervision of high risk pregnancy, unspecified, unspecified trimester: Secondary | ICD-10-CM | POA: Diagnosis not present

## 2021-06-11 DIAGNOSIS — O368390 Maternal care for abnormalities of the fetal heart rate or rhythm, unspecified trimester, not applicable or unspecified: Secondary | ICD-10-CM

## 2021-06-11 DIAGNOSIS — O4202 Full-term premature rupture of membranes, onset of labor within 24 hours of rupture: Secondary | ICD-10-CM | POA: Diagnosis not present

## 2021-06-11 DIAGNOSIS — Z20822 Contact with and (suspected) exposure to covid-19: Secondary | ICD-10-CM | POA: Diagnosis present

## 2021-06-11 DIAGNOSIS — R7689 Other specified abnormal immunological findings in serum: Secondary | ICD-10-CM | POA: Diagnosis present

## 2021-06-11 LAB — RESP PANEL BY RT-PCR (FLU A&B, COVID) ARPGX2
Influenza A by PCR: NEGATIVE
Influenza B by PCR: NEGATIVE
SARS Coronavirus 2 by RT PCR: NEGATIVE

## 2021-06-11 LAB — CBC
HCT: 38 % (ref 36.0–46.0)
Hemoglobin: 12.1 g/dL (ref 12.0–15.0)
MCH: 25.4 pg — ABNORMAL LOW (ref 26.0–34.0)
MCHC: 31.8 g/dL (ref 30.0–36.0)
MCV: 79.7 fL — ABNORMAL LOW (ref 80.0–100.0)
Platelets: 253 10*3/uL (ref 150–400)
RBC: 4.77 MIL/uL (ref 3.87–5.11)
RDW: 15.6 % — ABNORMAL HIGH (ref 11.5–15.5)
WBC: 14.6 10*3/uL — ABNORMAL HIGH (ref 4.0–10.5)
nRBC: 0 % (ref 0.0–0.2)

## 2021-06-11 LAB — POCT URINALYSIS DIPSTICK OB
Bilirubin, UA: NEGATIVE
Blood, UA: NEGATIVE
Glucose, UA: NEGATIVE
Ketones, UA: NEGATIVE
Leukocytes, UA: NEGATIVE
Nitrite, UA: NEGATIVE
POC,PROTEIN,UA: NEGATIVE
Spec Grav, UA: 1.01 (ref 1.010–1.025)
Urobilinogen, UA: 0.2 E.U./dL
pH, UA: 6 (ref 5.0–8.0)

## 2021-06-11 MED ORDER — ACETAMINOPHEN 325 MG PO TABS: 650.0000 mg | ORAL_TABLET | ORAL | Status: DC | PRN

## 2021-06-11 MED ORDER — EPHEDRINE 5 MG/ML INJ: 10.0000 mg | INTRAVENOUS | Status: DC | PRN

## 2021-06-11 MED ORDER — DIPHENHYDRAMINE HCL 50 MG/ML IJ SOLN: 12.5000 mg | INTRAMUSCULAR | Status: DC | PRN

## 2021-06-11 MED ORDER — IBUPROFEN 600 MG PO TABS
600.0000 mg | ORAL_TABLET | Freq: Four times a day (QID) | ORAL | Status: DC
  Administered 2021-06-11 – 2021-06-13 (×7): 600 mg via ORAL
  Filled 2021-06-11 (×7): qty 1

## 2021-06-11 MED ORDER — OXYTOCIN-SODIUM CHLORIDE 30-0.9 UT/500ML-% IV SOLN
1.0000 m[IU]/min | INTRAVENOUS | Status: DC
  Administered 2021-06-11: 2 m[IU]/min via INTRAVENOUS

## 2021-06-11 MED ORDER — BUTORPHANOL TARTRATE 1 MG/ML IJ SOLN: 1.0000 mg | INTRAMUSCULAR | Status: DC | PRN

## 2021-06-11 MED ORDER — PHENYLEPHRINE 40 MCG/ML (10ML) SYRINGE FOR IV PUSH (FOR BLOOD PRESSURE SUPPORT): 80.0000 ug | PREFILLED_SYRINGE | INTRAVENOUS | Status: DC | PRN

## 2021-06-11 MED ORDER — AMMONIA AROMATIC IN INHA
RESPIRATORY_TRACT | Status: AC
  Filled 2021-06-11: qty 10

## 2021-06-11 MED ORDER — OXYCODONE-ACETAMINOPHEN 5-325 MG PO TABS: 1.0000 | ORAL_TABLET | ORAL | Status: DC | PRN

## 2021-06-11 MED ORDER — BENZOCAINE-MENTHOL 20-0.5 % EX AERO
1.0000 "application " | INHALATION_SPRAY | CUTANEOUS | Status: DC | PRN
  Administered 2021-06-11: 1 via TOPICAL
  Filled 2021-06-11: qty 56

## 2021-06-11 MED ORDER — BUPIVACAINE HCL (PF) 0.25 % IJ SOLN
INTRAMUSCULAR | Status: DC | PRN
  Administered 2021-06-11 (×2): 4 mL via EPIDURAL

## 2021-06-11 MED ORDER — MISOPROSTOL 200 MCG PO TABS
ORAL_TABLET | ORAL | Status: AC
  Filled 2021-06-11: qty 4

## 2021-06-11 MED ORDER — OXYTOCIN BOLUS FROM INFUSION
333.0000 mL | Freq: Once | INTRAVENOUS | Status: AC
  Administered 2021-06-11: 333 mL via INTRAVENOUS

## 2021-06-11 MED ORDER — LACTATED RINGERS IV SOLN: 500.0000 mL | Freq: Once | INTRAVENOUS | Status: DC

## 2021-06-11 MED ORDER — LIDOCAINE-EPINEPHRINE (PF) 1.5 %-1:200000 IJ SOLN
INTRAMUSCULAR | Status: DC | PRN
  Administered 2021-06-11: 3 mL via PERINEURAL

## 2021-06-11 MED ORDER — SOD CITRATE-CITRIC ACID 500-334 MG/5ML PO SOLN: 30.0000 mL | ORAL | Status: DC | PRN

## 2021-06-11 MED ORDER — LACTATED RINGERS IV SOLN: INTRAVENOUS | Status: DC

## 2021-06-11 MED ORDER — ACETAMINOPHEN 325 MG PO TABS
650.0000 mg | ORAL_TABLET | ORAL | Status: DC | PRN
  Administered 2021-06-11: 650 mg via ORAL
  Filled 2021-06-11: qty 2

## 2021-06-11 MED ORDER — OXYTOCIN-SODIUM CHLORIDE 30-0.9 UT/500ML-% IV SOLN
2.5000 [IU]/h | INTRAVENOUS | Status: DC
  Filled 2021-06-11: qty 500

## 2021-06-11 MED ORDER — ONDANSETRON HCL 4 MG/2ML IJ SOLN
4.0000 mg | Freq: Four times a day (QID) | INTRAMUSCULAR | Status: DC | PRN
  Administered 2021-06-11: 4 mg via INTRAVENOUS
  Filled 2021-06-11: qty 2

## 2021-06-11 MED ORDER — FENTANYL-BUPIVACAINE-NACL 0.5-0.125-0.9 MG/250ML-% EP SOLN
EPIDURAL | Status: AC
  Filled 2021-06-11: qty 250

## 2021-06-11 MED ORDER — SODIUM CHLORIDE 0.9 % IV SOLN
INTRAVENOUS | Status: DC | PRN
  Administered 2021-06-11: 12 mL via EPIDURAL

## 2021-06-11 MED ORDER — TERBUTALINE SULFATE 1 MG/ML IJ SOLN: 0.2500 mg | Freq: Once | INTRAMUSCULAR | Status: DC | PRN

## 2021-06-11 MED ORDER — FENTANYL-BUPIVACAINE-NACL 0.5-0.125-0.9 MG/250ML-% EP SOLN: 12.0000 mL/h | EPIDURAL | Status: DC | PRN

## 2021-06-11 MED ORDER — LIDOCAINE HCL (PF) 1 % IJ SOLN
INTRAMUSCULAR | Status: DC | PRN
  Administered 2021-06-11: 2 mL
  Administered 2021-06-11: 3 mL

## 2021-06-11 MED ORDER — LACTATED RINGERS IV SOLN
500.0000 mL | INTRAVENOUS | Status: DC | PRN
  Administered 2021-06-11: 500 mL via INTRAVENOUS

## 2021-06-11 MED ORDER — OXYCODONE-ACETAMINOPHEN 5-325 MG PO TABS: 2.0000 | ORAL_TABLET | ORAL | Status: DC | PRN

## 2021-06-11 MED ORDER — LIDOCAINE HCL (PF) 1 % IJ SOLN
30.0000 mL | INTRAMUSCULAR | Status: DC | PRN
  Filled 2021-06-11: qty 30

## 2021-06-11 MED ORDER — OXYTOCIN 10 UNIT/ML IJ SOLN
INTRAMUSCULAR | Status: AC
  Filled 2021-06-11: qty 2

## 2021-06-11 NOTE — Progress Notes (Signed)
Intrapartum Progress Note  S: Patient without complaints. Is comfortable with epidural.  O: Blood pressure 130/64, pulse (!) 101, temperature 98.8 F (37.1 C), temperature source Oral, resp. rate 16, height 5\' 2"  (1.575 m), weight 113.9 kg, last menstrual period 09/12/2020, SpO2 100 %.  Gen App: NAD, comfortable Abdomen: soft, gravid FHT: baseline 145 bpm.  Accels present.  Decels present - late and variable decelerations present . moderate in degree variability.   Tocometer: contractions q 2 minutes Cervix: 9.5/100/0 Extremities: Nontender, no edema.  Pitocin: 4 mIU  Labs:  No new labs  Assessment:  1: SIUP at [redacted]w[redacted]d 2. Category II fetal tracing 3. Obesity in pregnancy  Plan:  1. Anticipate vaginal delivery soon.    [redacted]w[redacted]d, MD 06/11/2021 8:34 PM

## 2021-06-11 NOTE — Progress Notes (Signed)
Patient presents for ROB at [redacted]w[redacted]d. Patient complains of pressure on the upper belly, pelvic region and lumber region.

## 2021-06-11 NOTE — H&P (Addendum)
Obstetric History and Physical  Mariah Richardson is a 35 y.o. [phone] with IUP at [redacted]w[redacted]d presenting from the office for a non-reactive fetal tracing performed for obesity in pregnancy (BMI>40). Patient states she has been having  irregular contractions, minimal vaginal bleeding, intact membranes, with active fetal movement.  Current pregnancy conceived with home ICI.   Prenatal Course Source of Care: [facility] with onset of care at 20 weeks (transferred from [facility], onset of care at 7 weeks) Pregnancy complications or risks: Patient Active Problem List   Diagnosis Date Noted   Non-reassuring electronic fetal monitoring tracing 06/11/2021   Labor and delivery, indication for care 05/24/2021   Indication for care in labor or delivery 05/14/2021   Back pain affecting pregnancy in third trimester 05/14/2021   Nausea 05/14/2021   Decreased fetal movements in third trimester    Syncopal episodes 05/03/2021   Pelvic pain in pregnancy, antepartum, third trimester 05/03/2021   [redacted] weeks gestation of pregnancy    Supervision of high risk pregnancy, antepartum 11/06/2020   Obesity affecting pregnancy in third trimester 11/06/2020   Chronic migraine 07/30/2019   History of marijuana use 06/30/2017   Biological false positive RPR test 02/28/2017   Anxiety 10/03/2015   She plans to breastfeed She desires  abstinence  for postpartum contraception.   Prenatal labs and studies: ABO, Rh: --/--/B POS (11/21 1323) Antibody: POS (11/21 1323) Rubella: 4.29 (04/18 1453) RPR: Reactive (09/06 1057)  HBsAg: Negative (04/18 1453)  HIV: Non Reactive (04/18 1453)  EYC:XKGYJEHU/-- (11/09 1700) 1 hr Glucola  normal (108) Genetic screening normal Anatomy US normal   Past Medical History:  Diagnosis Date   Gastritis    Hypertension     Past Surgical History:  Procedure Laterality Date   APPENDECTOMY     CHOLECYSTECTOMY      OB History  Gravida Para Term Preterm AB Living  5  3 3   1 3   SAB IAB Ectopic Multiple Live Births        0 3    # Outcome Date GA Lbr Len/2nd Weight Sex Delivery Anes PTL Lv  5 Current           4 Term 01/05/18 [redacted]w[redacted]d / 00:46 3070 g F Vag-Spont EPI  LIV     Complications: Pre-eclampsia  3 Term 02/10/12 [redacted]w[redacted]d  3374 g M Vag-Spont EPI  LIV  2 AB 2008          1 Term 05/10/06 [redacted]w[redacted]d  3459 g F Vag-Spont EPI  LIV     Complications: Preeclampsia    Social History   Socioeconomic History   Marital status: Single    Spouse name: Not on file   Number of children: Not on file   Years of education: Not on file   Highest education level: Not on file  Occupational History   Not on file  Tobacco Use   Smoking status: Never   Smokeless tobacco: Never  Vaping Use   Vaping Use: Never used  Substance and Sexual Activity   Alcohol use: No   Drug use: No   Sexual activity: Not Currently    Birth control/protection: None  Other Topics Concern   Not on file  Social History Narrative   Not on file   Social Determinants of Health   Financial Resource Strain: Not on file  Food Insecurity: Not on file  Transportation Needs: Not on file  Physical Activity: Not on file  Stress: Not on file  Social Connections: Not on file    Family History  Problem Relation Age of Onset   Diabetes Mother    Migraines Father    Diverticulitis Father    Cancer Maternal Grandmother    Stroke Maternal Grandmother    Heart disease Maternal Grandmother    Cancer Paternal Grandmother    Heart disease Paternal Grandfather    Stroke Paternal Grandfather     Medications Prior to Admission  Medication Sig Dispense Refill Last Dose   acetaminophen (TYLENOL) 500 MG tablet Take 500 mg by mouth every 6 (six) hours as needed.   06/10/2021   aspirin 81 MG chewable tablet Chew by mouth daily.   06/10/2021   Pediatric Multivit-Minerals-C (FLINTSTONES GUMMIES COMPLETE PO) Take by mouth.   06/10/2021    Allergies  Allergen Reactions   Amoxicillin    Latex     Penicillins     Review of Systems: Negative except for what is mentioned in HPI.  Physical Exam: BP (!) 130/53 (BP Location: Left Arm)   Pulse 99   Temp 98.4 F (36.9 C) (Oral)   Resp 18   Ht 5\' 2"  (1.575 m)   Wt 113.9 kg   LMP 09/12/2020   BMI 45.91 kg/m  CONSTITUTIONAL: Well-developed, well-nourished female in no acute distress.  HENT:  Normocephalic, atraumatic, External right and left ear normal. Oropharynx is clear and moist EYES: Conjunctivae and EOM are normal. Pupils are equal, round, and reactive to light. No scleral icterus.  NECK: Normal range of motion, supple, no masses SKIN: Skin is warm and dry. No rash noted. Not diaphoretic. No erythema. No pallor. NEUROLOGIC: Alert and oriented to person, place, and time. Normal reflexes, muscle tone coordination. No cranial nerve deficit noted. PSYCHIATRIC: Normal mood and affect. Normal behavior. Normal judgment and thought content. CARDIOVASCULAR: Normal heart rate noted, regular rhythm RESPIRATORY: Effort and breath sounds normal, no problems with respiration noted ABDOMEN: Soft, nontender, nondistended, gravid. MUSCULOSKELETAL: Normal range of motion. No edema and no tenderness. 2+ distal pulses.  Cervical Exam: Dilatation 4 cm   Effacement 70%   Station -2  SROM'd at 3:15 pm. (Previously 2.5/50-60/-3 in office) Presentation: cephalic FHT:  Baseline rate 135 bpm   Variability moderate  Accelerations present (however infrequent)  Decelerations intermittent variables from baseline to 90s, occasional late deceleration (subtle) Contractions: Irregular, q 6-12 minutes   Pertinent Labs/Studies:   Results for orders placed or performed during the hospital encounter of 06/11/21 (from the past 24 hour(s))  CBC     Status: Abnormal   Collection Time: 06/11/21  1:23 PM  Result Value Ref Range   WBC 14.6 (H) 4.0 - 10.5 K/uL   RBC 4.77 3.87 - 5.11 MIL/uL   Hemoglobin 12.1 12.0 - 15.0 g/dL   HCT 06/13/21 16.1 - 09.6 %   MCV 79.7 (L)  80.0 - 100.0 fL   MCH 25.4 (L) 26.0 - 34.0 pg   MCHC 31.8 30.0 - 36.0 g/dL   RDW 04.5 (H) 40.9 - 81.1 %   Platelets 253 150 - 400 K/uL   nRBC 0.0 0.0 - 0.2 %  Type and screen [facility]     Status: None (Preliminary result)   Collection Time: 06/11/21  1:23 PM  Result Value Ref Range   ABO/RH(D) B POS    Antibody Screen POS    Sample Expiration      06/14/2021,2359 Performed at [facility], [address], [place], [place]    Antibody Identification  PENDING   Resp Panel by RT-PCR (Flu A&B, Covid) Nasopharyngeal Swab     Status: None   Collection Time: 06/11/21  1:44 PM   Specimen: Nasopharyngeal Swab; Nasopharyngeal(NP) swabs in vial transport medium  Result Value Ref Range   SARS Coronavirus 2 by RT PCR NEGATIVE NEGATIVE   Influenza A by PCR NEGATIVE NEGATIVE   Influenza B by PCR NEGATIVE NEGATIVE    Assessment : NEEMA BARREIRA is a 35 y.o. [phone] at [redacted]w[redacted]d being admitted for induction of labor due to non-reassuring fetal tracing, obesity in pregnancy, now with SROM and latent labor. History of false positive RPR.  Plan: Labor: Augmentation with Pitocin as needed, as per protocol. Analgesia as needed. FWB: Category II fetal heart tracing.  Continue to monitor closely and intervene as indicated. GBS negative Delivery plan: Hopeful for vaginal delivery   Hildred Laser, MD [facility]

## 2021-06-11 NOTE — Patient Instructions (Signed)
Signs and Symptoms of Labor ?Labor is the body's natural process of moving the baby and the placenta out of the uterus. The process of labor usually starts when the baby is full-term, between 39 and 41 weeks of pregnancy. ?Signs and symptoms that you are close to going into labor ?As your body prepares for labor and the birth of your baby, you may notice the following symptoms in the weeks and days before true labor starts: ?Passing a small amount of thick, bloody mucus from your vagina. This is called normal bloody show or losing your mucus plug. This may happen more than a week before labor begins, or right before labor begins, as the opening of the cervix starts to widen (dilate). For some women, the entire mucus plug passes at once. For others, pieces of the mucus plug may gradually pass over several days. ?Your baby moving (dropping) lower in your pelvis to get into position for birth (lightening). When this happens, you may feel more pressure on your bladder and pelvic bone and less pressure on your ribs. This may make it easier to breathe. It may also cause you to need to urinate more often and have problems with bowel movements. ?Having "practice contractions," also called Braxton Hicks contractions or false labor. These occur at irregular (unevenly spaced) intervals that are more than 10 minutes apart. False labor contractions are common after exercise or sexual activity. They will stop if you change position, rest, or drink fluids. These contractions are usually mild and do not get stronger over time. They may feel like: ?A backache or back pain. ?Mild cramps, similar to menstrual cramps. ?Tightening or pressure in your abdomen. ?Other early symptoms include: ?Nausea or loss of appetite. ?Diarrhea. ?Having a sudden burst of energy, or feeling very tired. ?Mood changes. ?Having trouble sleeping. ?Signs and symptoms that labor has begun ?Signs that you are in labor may include: ?Having contractions that come  at regular (evenly spaced) intervals and increase in intensity. This may feel like more intense tightening or pressure in your abdomen that moves to your back. ?Contractions may also feel like rhythmic pain in your upper thighs or back that comes and goes at regular intervals. ?If you are delivering for the first time, this change in intensity of contractions often occurs at a more gradual pace. ?If you have given birth before, you may notice a more rapid progression of contraction changes. ?Feeling pressure in the vaginal area. ?Your water breaking (rupture of membranes). This is when the sac of fluid that surrounds your baby breaks. Fluid leaking from your vagina may be clear or blood-tinged. Labor usually starts within 24 hours of your water breaking, but it may take longer to begin. ?Some people may feel a sudden gush of fluid; others may notice repeatedly damp underwear. ?Follow these instructions at home: ? ?When labor starts, or if your water breaks, call your health care provider or nurse care line. Based on your situation, they will determine when you should go in for an exam. ?During early labor, you may be able to rest and manage symptoms at home. Some strategies to try at home include: ?Breathing and relaxation techniques. ?Taking a warm bath or shower. ?Listening to music. ?Using a heating pad on the lower back for pain. If directed, apply heat to the area as often as told by your health care provider. Use the heat source that your health care provider recommends, such as a moist heat pack or a heating pad. ?Place a   towel between your skin and the heat source. ?Leave the heat on for 20-30 minutes. ?Remove the heat if your skin turns bright red. This is especially important if you are unable to feel pain, heat, or cold. You have a greater risk of getting burned. ?Contact a health care provider if: ?Your labor has started. ?Your water breaks. ?You have nausea, vomiting, or diarrhea. ?Get help right away  if: ?You have painful, regular contractions that are 5 minutes apart or less. ?Labor starts before you are [redacted] weeks along in your pregnancy. ?You have a fever. ?You have bright red blood coming from your vagina. ?You do not feel your baby moving. ?You have a severe headache with or without vision problems. ?You have chest pain or shortness of breath. ?These symptoms may represent a serious problem that is an emergency. Do not wait to see if the symptoms will go away. Get medical help right away. Call your local emergency services (911 in the [place] Do not drive yourself to the hospital. ?Summary ?Labor is your body's natural process of moving your baby and the placenta out of your uterus. ?The process of labor usually starts when your baby is full-term, between 39 and 40 weeks of pregnancy. ?When labor starts, or if your water breaks, call your health care provider or nurse care line. Based on your situation, they will determine when you should go in for an exam. ?This information is not intended to replace advice given to you by your health care provider. Make sure you discuss any questions you have with your health care provider. ?Document Revised: 11/21/2020 Document Reviewed: 11/21/2020 ?Elsevier Patient Education ? 2022 [employer] ? ?

## 2021-06-11 NOTE — Progress Notes (Signed)
ROB: Patient notes that she has been having some contractions. Scheduled for IOL in 2 days for BMI. NST performed today concerning.  Continue recommended antenatal testing and prenatal care. Possibly in latent labor. Given labor precautions.  To L&D for further evaluation.    NONSTRESS TEST INTERPRETATION  INDICATIONS: Obesity  FHR baseline: 140 bpm RESULTS:Non-reactive, despite use of acoustic stimulator, no accels COMMENTS: Variable deceleration from baseline down to 90s, occasional ctx   PLAN: 1. Will send to L&D for further monitoring and possible delivery. If no delivery, is scheduled for IOL in 2 days.

## 2021-06-11 NOTE — Anesthesia Preprocedure Evaluation (Signed)
Anesthesia Evaluation  Patient identified by MRN, date of birth, ID band Patient awake    Reviewed: Allergy & Precautions, H&P , NPO status , Patient's Chart, lab work & pertinent test results  Airway Mallampati: II       Dental no notable dental hx.    Pulmonary neg pulmonary ROS,           Cardiovascular hypertension,      Neuro/Psych  Headaches, Anxiety    GI/Hepatic Neg liver ROS, GERD  ,  Endo/Other  negative endocrine ROS  Renal/GU negative Renal ROS  negative genitourinary   Musculoskeletal   Abdominal   Peds  Hematology negative hematology ROS (+)   Anesthesia Other Findings   Reproductive/Obstetrics (+) Pregnancy                             Anesthesia Physical Anesthesia Plan  ASA: 2  Anesthesia Plan: Epidural   Post-op Pain Management:    Induction:   PONV Risk Score and Plan:   Airway Management Planned:   Additional Equipment:   Intra-op Plan:   Post-operative Plan:   Informed Consent: I have reviewed the patients History and Physical, chart, labs and discussed the procedure including the risks, benefits and alternatives for the proposed anesthesia with the patient or authorized representative who has indicated his/her understanding and acceptance.       Plan Discussed with: CRNA and Anesthesiologist  Anesthesia Plan Comments:         Anesthesia Quick Evaluation

## 2021-06-11 NOTE — Anesthesia Procedure Notes (Addendum)
Epidural Patient location during procedure: OB Start time: 06/11/2021 4:23 PM End time: 06/11/2021 4:33 PM  Staffing Anesthesiologist: Yevette Edwards, MD Resident/CRNA: Karoline Caldwell, CRNA Performed: resident/CRNA   Preanesthetic Checklist Completed: patient identified, IV checked, site marked, risks and benefits discussed, surgical consent, monitors and equipment checked, pre-op evaluation and timeout performed  Epidural Patient position: sitting Prep: ChloraPrep Patient monitoring: heart rate, continuous pulse ox and blood pressure Approach: midline Location: L3-L4 Injection technique: LOR saline  Needle:  Needle type: Tuohy  Needle gauge: 17 G Needle length: 9 cm and 9 Needle insertion depth: 8 cm Catheter type: closed end flexible Catheter size: 19 Gauge Catheter at skin depth: 12 cm Test dose: negative and 1.5% lidocaine with Epi 1:200 K  Assessment Sensory level: T10 Events: blood not aspirated, injection not painful, no injection resistance, no paresthesia and negative IV test  Additional Notes 2 attempt Pt. Evaluated and documentation done after procedure finished. Patient identified. Risks/Benefits/Options discussed with patient including but not limited to bleeding, infection, nerve damage, paralysis, failed block, incomplete pain control, headache, blood pressure changes, nausea, vomiting, reactions to medication both or allergic, itching and postpartum back pain. Confirmed with bedside nurse the patient's most recent platelet count. Confirmed with patient that they are not currently taking any anticoagulation, have any bleeding history or any family history of bleeding disorders. Patient expressed understanding and wished to proceed. All questions were answered. Sterile technique was used throughout the entire procedure. Please see nursing notes for vital signs. Test dose was given through epidural catheter and negative prior to continuing to dose epidural or start  infusion. Warning signs of high block given to the patient including shortness of breath, tingling/numbness in hands, complete motor block, or any concerning symptoms with instructions to call for help. Patient was given instructions on fall risk and not to get out of bed. All questions and concerns addressed with instructions to call with any issues or inadequate analgesia.    Patient tolerated the insertion well without immediate complications.Reason for block:procedure for pain

## 2021-06-11 NOTE — OB Triage Note (Signed)
Pt Mariah Richardson 35 y.o. presents to labor and delivery triage for non-reactive NST in the office with a deceleration as reported by Dr.Cherry . Pt is a [id number] at [redacted]w[redacted]d . Pt denies signs and symptons consistent with rupture of membranes or active vaginal bleeding. Pt denies contractions and states positive fetal movement. External FM and TOCO applied to non-tender abdomen and assessing. Initial FHR 150 . Vital signs obtained and within normal limits. Provider notified of pt.

## 2021-06-12 ENCOUNTER — Encounter: Payer: Medicaid Other | Admitting: Obstetrics and Gynecology

## 2021-06-12 LAB — RPR
RPR Ser Ql: REACTIVE — AB
RPR Titer: 1:2 {titer}

## 2021-06-12 MED ORDER — PRENATAL MULTIVITAMIN CH
1.0000 | ORAL_TABLET | Freq: Every day | ORAL | Status: DC
  Administered 2021-06-12 – 2021-06-13 (×2): 1 via ORAL
  Filled 2021-06-12 (×2): qty 1

## 2021-06-12 MED ORDER — OXYCODONE-ACETAMINOPHEN 5-325 MG PO TABS
1.0000 | ORAL_TABLET | Freq: Once | ORAL | Status: AC
  Administered 2021-06-12: 1 via ORAL
  Filled 2021-06-12: qty 1

## 2021-06-12 MED ORDER — DIPHENHYDRAMINE HCL 25 MG PO CAPS: 25.0000 mg | ORAL_CAPSULE | Freq: Four times a day (QID) | ORAL | Status: DC | PRN

## 2021-06-12 MED ORDER — COCONUT OIL OIL: 1.0000 "application " | TOPICAL_OIL | Status: DC | PRN

## 2021-06-12 MED ORDER — ONDANSETRON HCL 4 MG/2ML IJ SOLN: 4.0000 mg | INTRAMUSCULAR | Status: DC | PRN

## 2021-06-12 MED ORDER — IBUPROFEN 600 MG PO TABS: 600.0000 mg | ORAL_TABLET | Freq: Three times a day (TID) | ORAL | 1 refills | Status: DC | PRN

## 2021-06-12 MED ORDER — ZOLPIDEM TARTRATE 5 MG PO TABS: 5.0000 mg | ORAL_TABLET | Freq: Every evening | ORAL | Status: DC | PRN

## 2021-06-12 MED ORDER — WITCH HAZEL-GLYCERIN EX PADS: 1.0000 "application " | MEDICATED_PAD | CUTANEOUS | Status: DC | PRN

## 2021-06-12 MED ORDER — SIMETHICONE 80 MG PO CHEW: 80.0000 mg | CHEWABLE_TABLET | ORAL | Status: DC | PRN

## 2021-06-12 MED ORDER — DIBUCAINE (PERIANAL) 1 % EX OINT: 1.0000 "application " | TOPICAL_OINTMENT | CUTANEOUS | Status: DC | PRN

## 2021-06-12 MED ORDER — SENNOSIDES-DOCUSATE SODIUM 8.6-50 MG PO TABS
2.0000 | ORAL_TABLET | Freq: Every day | ORAL | Status: DC
  Administered 2021-06-12 – 2021-06-13 (×2): 2 via ORAL
  Filled 2021-06-12 (×2): qty 2

## 2021-06-12 MED ORDER — ONDANSETRON HCL 4 MG PO TABS: 4.0000 mg | ORAL_TABLET | ORAL | Status: DC | PRN

## 2021-06-12 MED ORDER — FUROSEMIDE 20 MG PO TABS
10.0000 mg | ORAL_TABLET | Freq: Once | ORAL | Status: AC
  Administered 2021-06-12: 10 mg via ORAL
  Filled 2021-06-12: qty 0.5

## 2021-06-12 NOTE — Lactation Note (Signed)
This note was copied from a baby's chart. Lactation Consultation Note  Patient Name: Mariah Richardson [id number]'[id number] Date: 06/12/2021 Reason for consult: Initial assessment;Early term 37-38.6wks Age:34 hours  Initial lactation visit. Mom is P4, SVD 12 hours ago. Mom has breastfeeding experience with her oldest, now 34yrs old, and trouble with breastfeeding with the last two. Mom reports a tongue tie in her now 33yr old creating breastfeeding difficulties.  There are documented feedings and 1 wet diaper since delivery. Mom was given a nipple shield in the early morning hours, mom said it was given to help baby latch better. Mom's nipples do evert with stimulation and pliable breast tissue; next feeding attempt will be with lactation support and without a shield to determine true need. Baby was circumcised this morning, which mom was educated may impact his desire to eat for the next few hours.  Encouraged early cues, feeding on demand, and frequent attempts throughout today.  Lactation will also observe baby's mouth when able to see if there is possible concern for oral restrictions.  Whiteboard updated with LC name/number; mom plans to call with next attempt.  Maternal Data Has patient been taught Hand Expression?: Yes Does the patient have breastfeeding experience prior to this delivery?: Yes How long did the patient breastfeed?: 35yrs (with first, tongue tie with last, no feeding w/ second)  Feeding Mother's Current Feeding Choice: Breast Milk  LATCH Score                    Lactation Tools Discussed/Used Tools:  (nipple shield given, may not be needed)  Interventions Interventions: Breast feeding basics reviewed;Hand express;Education  Discharge    Consult Status Consult Status: Follow-up Date: 06/12/21 Follow-up type: In-patient    Mariah Richardson 06/12/2021, 9:57 AM

## 2021-06-12 NOTE — Lactation Note (Signed)
This note was copied from a baby's chart. Lactation Consultation Note  Patient Name: Mariah Richardson GGYIR'S Date: 06/12/2021 Reason for consult: Follow-up assessment;Mother's request;Early term 37-38.6wks Age:34 hours  Lactation check-in. Mom reports an independent feed on R breast about 30 minutes ago for about 10 minutes. Baby appears content and calm, but mom requested help with positioning and latch on L breast (she feels this side is more difficult for her).   Support pillows added, baby brought to position in football hold on L breast. LC walked mom through sandwiching of breast tissue in shape of baby's smile behind the areola. Baby latched easily, rhythmic sucking pattern and swallows occasionally identified. Tips given for keeping baby awake at the breast while feeding. Anticipatory guidance given for cluster feeding overnight, and encouraged baby at breast with all cues and how this helps to build and establish supply.   LC also spoke with mom about not having a need at this time for a nipple shield. Baby has latched 3 different feedings today with transfer identified without it, no pain/discomfort, and round nipple post feedings. Mom verbalizes understanding.  Maternal Data Has patient been taught Hand Expression?: Yes Does the patient have breastfeeding experience prior to this delivery?: Yes  Feeding Mother's Current Feeding Choice: Breast Milk  LATCH Score Latch: Grasps breast easily, tongue down, lips flanged, rhythmical sucking.  Audible Swallowing: A few with stimulation  Type of Nipple: Everted at rest and after stimulation  Comfort (Breast/Nipple): Soft / non-tender  Hold (Positioning): Assistance needed to correctly position infant at breast and maintain latch.  LATCH Score: 8   Lactation Tools Discussed/Used Tools:  (no shield used)  Interventions Interventions: Assisted with latch;Support pillows;Position options;Education  Discharge    Consult  Status Consult Status: Follow-up Date: 06/13/21 Follow-up type: In-patient    Danford Bad 06/12/2021, 5:59 PM

## 2021-06-12 NOTE — Lactation Note (Signed)
This note was copied from a baby's chart. Lactation Consultation Note  Patient Name: Mariah Richardson CHENI'D Date: 06/12/2021 Reason for consult: Follow-up assessment;Mother's request;Early term 37-38.6wks Age:34 hours  Lactation called to bedside by mom to assist with first feeding post circumcision. Baby awake and alert. Mom reports that baby has not fed at all on the R breast, so we positioned support pillows and baby at R breast in football hold. LC sandwiched the breast tissue and brought baby in. Baby grasped the breast easily and maintained latch throughout feeding, rhythmic sucking pattern with swallows identified with deepening of the chin. Mom notes some initial discomfort that subsided within the first minute; we discussed transient nipple tenderness with breastfeeding and how to tell if the discomfort is from shallow/poor latch or just initial tenderness. Baby pulled away at the end of the feeding. Mom brought baby skin to skin on her chest. Encouraged to offer other breast if baby begins to cue again and to call for support if needed.  Maternal Data Has patient been taught Hand Expression?: Yes Does the patient have breastfeeding experience prior to this delivery?: Yes  Feeding Mother's Current Feeding Choice: Breast Milk  LATCH Score Latch: Grasps breast easily, tongue down, lips flanged, rhythmical sucking.  Audible Swallowing: A few with stimulation  Type of Nipple: Everted at rest and after stimulation (after stimulation)  Comfort (Breast/Nipple): Soft / non-tender  Hold (Positioning): Assistance needed to correctly position infant at breast and maintain latch.  LATCH Score: 8   Lactation Tools Discussed/Used Tools:  (no nipple shield used)  Interventions Interventions: Breast feeding basics reviewed;Assisted with latch;Hand express;Support pillows;Position options;Education  Discharge    Consult Status Consult Status: Follow-up Date:  06/12/21 Follow-up type: Call as needed    Danford Bad 06/12/2021, 1:41 PM

## 2021-06-12 NOTE — Anesthesia Postprocedure Evaluation (Signed)
Anesthesia Post Note  Patient: Mariah Richardson  Procedure(s) Performed: AN AD HOC LABOR EPIDURAL  Patient location during evaluation: Mother Baby Anesthesia Type: Epidural Level of consciousness: awake and alert Pain management: pain level controlled Vital Signs Assessment: post-procedure vital signs reviewed and stable Respiratory status: spontaneous breathing, nonlabored ventilation and respiratory function stable Cardiovascular status: stable Postop Assessment: no headache, no backache and epidural receding Anesthetic complications: no   No notable events documented.   Last Vitals:  Vitals:   06/12/21 0840 06/12/21 1122  BP: 126/78 119/60  Pulse: 96 90  Resp: 18 18  Temp: 36.6 C 36.7 C  SpO2: 96%     Last Pain:  Vitals:   06/12/21 1122  TempSrc: Oral  PainSc:                  [name]

## 2021-06-12 NOTE — Progress Notes (Signed)
Post Partum Day # 1, s/p SVD  Subjective: no complaints, up ad lib, and tolerating PO  Objective: Temp:  [98 F (36.7 C)-99.2 F (37.3 C)] 98 F (36.7 C) (11/22 0424) Pulse Rate:  [89-112] 98 (11/22 0424) Resp:  [14-18] 16 (11/22 0424) BP: (109-140)/(51-78) 124/72 (11/22 0424) SpO2:  [97 %-100 %] 100 % (11/21 2115) Weight:  [113.9 kg] 113.9 kg (11/21 1101)  Physical Exam:  General: alert and no distress  Lungs: clear to auscultation bilaterally Breasts: normal appearance, no masses or tenderness Heart: regular rate and rhythm, S1, S2 normal, no murmur, click, rub or gallop Abdomen: soft, non-tender; bowel sounds normal; no masses,  no organomegaly Pelvis: Lochia: appropriate, Uterine Fundus: firm Extremities: DVT Evaluation: No evidence of DVT seen on physical exam. Negative Homan's sign. No cords or calf tenderness. No significant calf/ankle edema.  Recent Labs    06/11/21 1323  HGB 12.1  HCT 38.0    Assessment/Plan: Breastfeeding, Lactation consult Circumcision prior to discharge  ontraception abstinence Plan for discharge tomorrow   LOS: 1 day   Hildred Laser, MD [facility] 06/12/2021 8:24 AM

## 2021-06-13 NOTE — Discharge Summary (Signed)
Postpartum Discharge Summary      Patient Name: Mariah Richardson DOB: [birth year] MRN: [id number]  Date of admission: 06/11/2021 Delivery date:06/11/2021  Delivering provider: Rubie Maid  Date of discharge: 06/13/2021  Admitting diagnosis: Non-reassuring electronic fetal monitoring tracing [O36.8390] Intrauterine pregnancy: [redacted]w[redacted]d    Secondary diagnosis:  Principal Problem:   Non-reassuring electronic fetal monitoring tracing Active Problems:   Biological false positive RPR test  Additional problems: None    Discharge diagnosis: Term Pregnancy Delivered                                              Post partum procedures: None Augmentation: Pitocin Complications: None  Hospital course: Induction of Labor With Vaginal Delivery   35y.o. yo [id number] admitted to the hospital 06/11/2021 for induction of labor.  Indication for induction:  Non-reassuring fetal tracing, latent labor .  Patient had an uncomplicated labor course as follows: Membrane Rupture Time/Date: 3:15 PM ,06/11/2021   Delivery Method:Vaginal, Spontaneous  Episiotomy: None  Lacerations:  None  Details of delivery can be found in separate delivery note.  Patient had a routine postpartum course. Patient is discharged home 06/16/21.  Newborn Data: Birth date:06/11/2021  Birth time:9:06 PM  Gender:Female  Living status:Living  Apgars:8 ,9  Weight:3490 g   Magnesium Sulfate received: No BMZ received: No Rhophylac:No MMR:No T-DaP:Given prenatally Flu: No (declined) Transfusion:No  Physical exam  Vitals:   06/12/21 1925 06/12/21 2347 06/13/21 0354 06/13/21 0807  BP: 120/73 (!) 107/50 (!) 122/58 116/64  Pulse: 99 93 91 83  Resp: 18 18 18 18   Temp: 98.3 F (36.8 C) 98.3 F (36.8 C) 98.1 F (36.7 C) 98.3 F (36.8 C)  TempSrc: Oral Oral Oral Oral  SpO2: 99% 100% 100% 99%  Weight:      Height:       General: alert and cooperative Lochia: appropriate Uterine Fundus: firm Incision:  N/A DVT Evaluation: No evidence of DVT seen on physical exam. Negative Homan's sign. No cords or calf tenderness. Labs: Lab Results  Component Value Date   WBC 14.6 (H) 06/11/2021   HGB 12.1 06/11/2021   HCT 38.0 06/11/2021   MCV 79.7 (L) 06/11/2021   PLT 253 06/11/2021   CMP Latest Ref Rng & Units 05/03/2021  Glucose 70 - 99 mg/dL 85  BUN 6 - 20 mg/dL 7  Creatinine 0.44 - 1.00 mg/dL 0.43(L)  Sodium 135 - 145 mmol/L 137  Potassium 3.5 - 5.1 mmol/L 4.1  Chloride 98 - 111 mmol/L 102  CO2 22 - 32 mmol/L 27  Calcium 8.9 - 10.3 mg/dL 8.8(L)  Total Protein 6.5 - 8.1 g/dL 6.8  Total Bilirubin 0.3 - 1.2 mg/dL 0.4  Alkaline Phos 38 - 126 U/L 77  AST 15 - 41 U/L 15  ALT 0 - 44 U/L 17   Edinburgh Score: Edinburgh Postnatal Depression Scale Screening Tool 06/12/2021  I have been able to laugh and see the funny side of things. 0  I have looked forward with enjoyment to things. 0  I have blamed myself unnecessarily when things went wrong. 0  I have been anxious or worried for no good reason. 0  I have felt scared or panicky for no good reason. 0  Things have been getting on top of me. 0  I have been so unhappy that I have  had difficulty sleeping. 0  I have felt sad or miserable. 0  I have been so unhappy that I have been crying. 0  The thought of harming myself has occurred to me. 0  Edinburgh Postnatal Depression Scale Total 0      After visit meds:  Allergies as of 06/13/2021       Reactions   Amoxicillin    Latex    Penicillins         Medication List     STOP taking these medications    acetaminophen 500 MG tablet Commonly known as: TYLENOL   aspirin 81 MG chewable tablet       TAKE these medications    FLINTSTONES GUMMIES COMPLETE PO Take by mouth.   ibuprofen 600 MG tablet Commonly known as: ADVIL Take 1 tablet (600 mg total) by mouth every 8 (eight) hours as needed.         Discharge home in stable condition Infant Feeding: Breast Infant  Disposition:home with mother Discharge instruction: per After Visit Summary and Postpartum booklet. Activity: Advance as tolerated. Pelvic rest for 6 weeks.  Diet: routine diet Anticipated Birth Control:  Abstinence Postpartum Appointment:6 weeks Additional Postpartum F/U: Postpartum Depression checkup Future Appointments:No future appointments. Follow up Visit:  Follow-up Information     Rubie Maid, MD Follow up.   Specialties: Obstetrics and Gynecology, Radiology Why: 2-3 week postpartum mood check (televisit) 6 week postpartum visit in office Contact information: [address] [place] [address] [phone]                  06/13/2021 Rubie Maid, MD [facility]

## 2021-06-13 NOTE — Discharge Instructions (Signed)

## 2021-06-13 NOTE — Progress Notes (Signed)
Post Partum Day # 2, s/p SVD  Subjective: no complaints, up ad lib, and tolerating PO.  Overnight patient noted an episode of blurred vision, upper abdominal pain, and leg swelling. Noted having a h/o pre-eclampsia in a previous pregnancy and was concerned. Was given a dose of Lasix and Percocet tablet. Feels much better this morning.  BPs remain wnl.   Objective: Vitals:   06/12/21 1925 06/12/21 2347 06/13/21 0354 06/13/21 0807  BP: 120/73 (!) 107/50 (!) 122/58 116/64  Pulse: 99 93 91 83  Resp: 18 18 18 18   Temp: 98.3 F (36.8 C) 98.3 F (36.8 C) 98.1 F (36.7 C) 98.3 F (36.8 C)  TempSrc: Oral Oral Oral Oral  SpO2: 99% 100% 100% 99%  Weight:      Height:        Physical Exam:  General: alert and no distress  Lungs: clear to auscultation bilaterally Breasts: normal appearance, no masses or tenderness Heart: regular rate and rhythm, S1, S2 normal, no murmur, click, rub or gallop Abdomen: soft, non-tender; bowel sounds normal; no masses,  no organomegaly Pelvis: Lochia: appropriate, Uterine Fundus: firm Extremities: DVT Evaluation: No evidence of DVT seen on physical exam. Negative Homan's sign. No cords or calf tenderness. No significant calf/ankle edema.  Recent Labs    06/11/21 1323  HGB 12.1  HCT 38.0     Assessment/Plan: Breastfeeding, s/p Lactation consult Circumcision done Contraception abstinence Plan for discharge today. Given PIH precautions.    LOS: 2 days   06/13/21, MD [facility] 06/13/2021 8:19 AM

## 2021-06-13 NOTE — Progress Notes (Signed)
Mother discharged.  Discharge instructions given.  Mother verbalizes understanding.  Transported by auxiliary.  

## 2021-06-13 NOTE — Lactation Note (Signed)
This note was copied from a baby's chart. Lactation Consultation Note  Patient Name: Mariah Richardson OBSJG'G Date: 06/13/2021 Reason for consult: Follow-up assessment;Mother's request;Difficult latch;Early term 37-38.6wks Age:34 hours  Lactation follow-up. Mom provided formula to baby throughout the night due to worries of elevated bilirubin levels. Mom thought that if baby had elevated levels she would have to leave him in SCN after her discharge.  Mom did say that she continued to attempt feeding/latching at breast prior to formula and also pumped each time baby fed from bottle to continue stimulation. Last feeding at 0730 of formula, baby awake and calm; he did latch at the breast and sustain latch however did not attempt to suckle, remained calm and disinterested. We did talk about him possibly still being full from last feeding, encouraged skin to skin and watching for early cues. We will continue to work on breastfeeding throughout the day.  Maternal Data Has patient been taught Hand Expression?: Yes Does the patient have breastfeeding experience prior to this delivery?: Yes How long did the patient breastfeed?: 41yrs with first child (minimal bf with other 2 children)  Feeding Mother's Current Feeding Choice: Breast Milk Nipple Type: Slow - flow  LATCH Score Latch: Repeated attempts needed to sustain latch, nipple held in mouth throughout feeding, stimulation needed to elicit sucking reflex.  Audible Swallowing: None  Type of Nipple: Everted at rest and after stimulation  Comfort (Breast/Nipple): Soft / non-tender  Hold (Positioning): Assistance needed to correctly position infant at breast and maintain latch.  LATCH Score: 6   Lactation Tools Discussed/Used    Interventions Interventions: Breast feeding basics reviewed;Assisted with latch;Hand express;Support pillows;Position options;Education  Discharge    Consult Status Consult Status: Follow-up Date:  06/13/21 Follow-up type: In-patient    Danford Bad 06/13/2021, 9:23 AM

## 2021-06-13 NOTE — Lactation Note (Signed)
This note was copied from a baby's chart. Lactation Consultation Note  Patient Name: Mariah Richardson PIRJJ'O Date: 06/13/2021 Reason for consult: Follow-up assessment;Early term 37-38.6wks Age:34 hours  Discharge orders given after bilirubin was re-checked this morning. LC and mom talked through game plan for continuing breastfeeding efforts. We reviewed positioning, latch, sandwiching of breast tissue, catching baby with early/calm cues for best latch. Reviewed difference between deep and shallow latch and how to determine transfer. Encouraged pump use post feeding trials to help build and then protect supply so that EBM can be used as needed for ongoing nutritional support. Outpatient lactation service information given to mom, mom encouraged to call for outpatient appointment and with all questions.   Maternal Data Has patient been taught Hand Expression?: Yes Does the patient have breastfeeding experience prior to this delivery?: Yes How long did the patient breastfeed?: 54yrs with first child (minimal bf with other 2 children)  Feeding Mother's Current Feeding Choice: Breast Milk  LATCH Score Latch: Repeated attempts needed to sustain latch, nipple held in mouth throughout feeding, stimulation needed to elicit sucking reflex.  Audible Swallowing: None  Type of Nipple: Everted at rest and after stimulation  Comfort (Breast/Nipple): Soft / non-tender  Hold (Positioning): Assistance needed to correctly position infant at breast and maintain latch.  LATCH Score: 6   Lactation Tools Discussed/Used    Interventions Interventions: Breast feeding basics reviewed;Education  Discharge Discharge Education: Engorgement and breast care;Warning signs for feeding baby;Outpatient recommendation Pump: Personal  Consult Status Consult Status: Complete Date: 06/13/21 Follow-up type: In-patient    Danford Bad 06/13/2021, 11:35 AM

## 2021-06-14 LAB — TYPE AND SCREEN
ABO/RH(D): B POS
Antibody Screen: POSITIVE
Unit division: 0
Unit division: 0

## 2021-06-14 LAB — BPAM RBC
Blood Product Expiration Date: 202212212359
Blood Product Expiration Date: 202212212359
Unit Type and Rh: 5100
Unit Type and Rh: 5100

## 2021-06-15 LAB — T.PALLIDUM AB, TOTAL: T Pallidum Abs: NONREACTIVE

## 2021-06-22 ENCOUNTER — Ambulatory Visit: Payer: Self-pay

## 2021-06-22 NOTE — Lactation Note (Addendum)
This note was copied from a baby's chart. Lactation Consultation Note  Patient Name: Mariah Richardson VVOHY'W Date: 06/22/2021   Age:34 days  Maternal Data  Mom pumping 3 oz every 2-3 hrs with Medela Advanced pump and style, she states it doesn't have a lot of suction, reviewed trouble shooting, she has tried all of this, may try increasing flange size and contacting insurance co. Or where she bought pump.  She did not bring her pump to the consult.    She breastfed her first child for 2 yrs.  Feeding  BAby has not latched to breast well since using formula to decrease bili in hospital, his wt was 7-11 at birth and 8-1 on 11-28, today wt is 8.7.4 or 3838 gm with diaper only, baby was fed at 10:30 am , 3 oz EBM, by bottle, attempted latch but baby just cried on left breast, mom's nipple sl flat, 24 mm nipple shield applied and baby latched well and would suck a few times swallow and pull off the breast, he did this several times over , mom switched him to right breast with 24 mm nipple shield in place, mom states she has more milk on this side, baby did the same thing on this side in cradle hold, kept pulling off, sometimes crying and then going back to the breast, suspect baby was used to flow from bottle, no tongue tie noted, mom's last child had a tongue tie and didn't breastfeed, Feeding ended as baby falling asleep at breast, milk in shaft of nipple shield at all times, post wt was 3858 gm or 8.8.1 lbs with a milk intake of 20cc  LATCH Score                    Lactation Tools Discussed/Used    Interventions  Recommended mom supplement after breastfeeding with 66 cc EBM or formula until baby begins taking more in at the breast.   Pump breasts after every breastfeeding session if possible to increase stimulation, use paced bottlefeeding when supplementing, mom was instructed in this. May try periodically to  latch baby without shield, but will be more successful probably when  supply increased.   May take 72hr or more to increase supply     Discharge    Consult Status  Call [facility] as needed for consult or questions, has appt on 12-5 with pediatrician.     Dyann Kief 06/22/2021, 3:50 PM

## 2021-06-25 ENCOUNTER — Other Ambulatory Visit: Payer: Medicaid Other

## 2021-06-26 ENCOUNTER — Other Ambulatory Visit: Payer: Medicaid Other

## 2021-07-03 ENCOUNTER — Telehealth (INDEPENDENT_AMBULATORY_CARE_PROVIDER_SITE_OTHER): Payer: Medicaid Other | Admitting: Obstetrics and Gynecology

## 2021-07-03 ENCOUNTER — Encounter: Payer: Self-pay | Admitting: Obstetrics and Gynecology

## 2021-07-03 VITALS — Ht 62.0 in

## 2021-07-03 DIAGNOSIS — Z1332 Encounter for screening for maternal depression: Secondary | ICD-10-CM

## 2021-07-03 NOTE — Patient Instructions (Signed)
Postpartum Care After Vaginal Delivery The following information offers guidance about how to care for yourself from the time you deliver your baby to 6-12 weeks after delivery (postpartum period). If you have problems or questions, contact your health care provider for more specific instructions. Follow these instructions at home: Vaginal bleeding It is normal to have vaginal bleeding (lochia) after delivery. Wear a sanitary pad for bleeding and discharge. During the first week after delivery, the amount and appearance of lochia is often similar to a menstrual period. Over the next few weeks, it will gradually decrease to a dry, yellow-brown discharge. For most women, lochia stops completely by 4-6 weeks after delivery, but can vary. Change your sanitary pads frequently. Watch for any changes in your flow, such as: A sudden increase in volume. A change in color. Large blood clots. If you pass a blood clot from your vagina, save it and call your health care provider. Do not flush blood clots down the toilet before talking with your health care provider. Do not use tampons or douches until your health care provider approves. If you are not breastfeeding, your period should return 6-8 weeks after delivery. If you are feeding your baby breast milk only, your period may not return until you stop breastfeeding. Perineal care  Keep the area between the vagina and the anus (perineum) clean and dry. Use medicated pads and pain-relieving sprays and creams as directed. If you had a surgical cut in the perineum (episiotomy) or a tear, check the area for signs of infection until you are healed. Check for: More redness, swelling, or pain. Fluid or blood coming from the cut or tear. Warmth. Pus or a bad smell. You may be given a squirt bottle to use instead of wiping to clean the perineum area after you use the bathroom. Pat the area gently to dry it. To relieve pain caused by an episiotomy, a tear, or  swollen veins in the anus (hemorrhoids), take a warm sitz bath 2-3 times a day. In a sitz bath, the warm water should only come up to your hips and cover your buttocks. Breast care In the first few days after delivery, your breasts may feel heavy, full, and uncomfortable (breast engorgement). Milk may also leak from your breasts. Ask your health care provider about ways to help relieve the discomfort. If you are breastfeeding: Wear a bra that supports your breasts and fits well. Use breast pads to absorb milk that leaks. Keep your nipples clean and dry. Apply creams and ointments as told. You may have uterine contractions every time you breastfeed for up to several weeks after delivery. This helps your uterus return to its normal size. If you have any problems with breastfeeding, notify your health care provider or lactation consultant. If you are not breastfeeding: Avoid touching your breasts. Do not squeeze out (express) milk. Doing this can make your breasts produce more milk. Wear a good-fitting bra and use cold packs to help with swelling. Intimacy and sexuality Ask your health care provider when you can engage in sexual activity. This may depend upon: Your risk of infection. How fast you are healing. Your comfort and desire to engage in sexual activity. You are able to get pregnant after delivery, even if you have not had your period. Talk with your health care provider about methods of birth control (contraception) or family planning if you desire future pregnancies. Medicines Take over-the-counter and prescription medicines only as told by your health care provider. Take an  over-the-counter stool softener to help ease bowel movements as told by your health care provider. If you were prescribed an antibiotic medicine, take it as told by your health care provider. Do not stop taking the antibiotic even if you start to feel better. Review all previous and current prescriptions to check for  possible transfer into breast milk. Activity Gradually return to your normal activities as told by your health care provider. Rest as much as possible. Nap while your baby is sleeping. Eating and drinking  Drink enough fluid to keep your urine pale yellow. To help prevent or relieve constipation, eat high-fiber foods every day. Choose healthy eating to support breastfeeding or weight loss goals. Take your prenatal vitamins until your health care provider tells you to stop. General tips/recommendations Do not use any products that contain nicotine or tobacco. These products include cigarettes, chewing tobacco, and vaping devices, such as e-cigarettes. If you need help quitting, ask your health care provider. Do not drink alcohol, especially if you are breastfeeding. Do not take medications or drugs that are not prescribed to you, especially if you are breastfeeding. Visit your health care provider for a postpartum checkup within the first 3-6 weeks after delivery. Complete a comprehensive postpartum visit no later than 12 weeks after delivery. Keep all follow-up visits for you and your baby. Contact a health care provider if: You feel unusually sad or worried. Your breasts become red, painful, or hard. You have a fever or other signs of an infection. You have bleeding that is soaking through one pad an hour or you have blood clots. You have a severe headache that doesn't go away or you have vision changes. You have nausea and vomiting and are unable to eat or drink anything for 24 hours. Get help right away if: You have chest pain or difficulty breathing. You have sudden, severe leg pain. You faint or have a seizure. You have thoughts about hurting yourself or your baby. If you ever feel like you may hurt yourself or others, or have thoughts about taking your own life, get help right away. Go to your nearest emergency department or: Call your local emergency services (911 in the  [place] The National Suicide Prevention Lifeline at 301-404-7403 or 988 in the [place] This suicide crisis helpline is open 24 hours a day. Text the Crisis Text Line at 786-357-1551 (in the [place] Summary The period of time after you deliver your newborn up to 6-12 weeks after delivery is called the postpartum period. Keep all follow-up visits for you and your baby. Review all previous and current prescriptions to check for possible transfer into breast milk. Contact a health care provider if you feel unusually sad or worried during the postpartum period. This information is not intended to replace advice given to you by your health care provider. Make sure you discuss any questions you have with your health care provider. Document Revised: 01/31/2021 Document Reviewed: 03/23/2020 Elsevier Patient Education  2022 [employer].

## 2021-07-03 NOTE — Progress Notes (Signed)
° ° ° °  Virtual Visit via Video Note  I connected with Luis Abed on 07/03/21 at  1:45 PM EST by a video enabled telemedicine application and verified that I am speaking with the correct person using two identifiers.  Location: Patient: Home Provider: Office   I discussed the limitations of evaluation and management by telemedicine and the availability of in person appointments. The patient expressed understanding and agreed to proceed.  History of Present Illness:   Mariah Richardson is a 35 y.o. [phone] female who presents for a postpartum visit. She is 2 week postpartum following a spontaneous vaginal delivery. The delivery was at 38.6 gestational weeks for non-reassuring fetal tracing.  Anesthesia: epidural. Postpartum course has been uncomplicated. Baby's course has been uncomplicated. Baby is feeding by both breast and bottle - Similac with Iron. Bleeding: light flow. Patient is not sexually active. Postpartum depression screening: negative.  EDPS score is 0.   She has a concern about the pain in her right leg that radiates in her pelvis, buttocks and right foot. Pain is worse when she is walking, it comes and goes.    Review of Systems Pertinent items noted in HPI and remainder of comprehensive ROS otherwise negative.    Observations/Objective: Height 5\' 2"  (1.575 m), last menstrual period 09/12/2020, currently breastfeeding.  Gen App: NAD HEENT: NCAT, eyes appear normal, normal nares and dentition.  Psych: normal mood and affect, normal speech  Edinburgh Postnatal Depression Scale Screening Tool 07/03/2021 06/12/2021  I have been able to laugh and see the funny side of things. 0 0  I have looked forward with enjoyment to things. 0 0  I have blamed myself unnecessarily when things went wrong. 0 0  I have been anxious or worried for no good reason. 0 0  I have felt scared or panicky for no good reason. 0 0  Things have been getting on top of me. 0 0  I have been so  unhappy that I have had difficulty sleeping. 0 0  I have felt sad or miserable. 0 0  I have been so unhappy that I have been crying. 0 0  The thought of harming myself has occurred to me. 0 0  Edinburgh Postnatal Depression Scale Total 0 0     Assessment and Plan: Screening for maternal depression - screen negative.  Postpartum state (s/p SVD) - doing well Lactating mother - breastfeeding and formula feeding. Just purchased a new pump.    Follow Up Instructions: RTC in 4 weeks for final postpartum visit.   I discussed the assessment and treatment plan with the patient. The patient was provided an opportunity to ask questions and all were answered. The patient agreed with the plan and demonstrated an understanding of the instructions.   The patient was advised to call back or seek an in-person evaluation if the symptoms worsen or if the condition fails to improve as anticipated.  I provided 8 minutes of virtual face-to-face time during this encounter.   06/14/2021, MD [facility]

## 2021-07-31 ENCOUNTER — Encounter: Payer: Self-pay | Admitting: Obstetrics and Gynecology

## 2021-07-31 ENCOUNTER — Ambulatory Visit: Payer: Medicaid Other | Admitting: Obstetrics and Gynecology

## 2021-07-31 ENCOUNTER — Other Ambulatory Visit (HOSPITAL_COMMUNITY)
Admission: RE | Admit: 2021-07-31 | Discharge: 2021-07-31 | Disposition: A | Discharge status: Home / Self Care | Payer: Medicaid Other | Source: Ambulatory Visit | Attending: Obstetrics and Gynecology | Admitting: Obstetrics and Gynecology

## 2021-07-31 ENCOUNTER — Other Ambulatory Visit: Payer: Self-pay

## 2021-07-31 DIAGNOSIS — Z124 Encounter for screening for malignant neoplasm of cervix: Secondary | ICD-10-CM | POA: Diagnosis not present

## 2021-07-31 DIAGNOSIS — O9270 Unspecified disorders of lactation: Secondary | ICD-10-CM | POA: Diagnosis not present

## 2021-07-31 MED ORDER — METOCLOPRAMIDE HCL 10 MG PO TABS: 10.0000 mg | ORAL_TABLET | Freq: Four times a day (QID) | ORAL | 0 refills | Status: DC

## 2021-07-31 NOTE — Patient Instructions (Signed)
Breastfeeding and Inducing Lactation The process of producing breast milk starts when you get pregnant. At this time, hormones in your body change to prepare your body to make breast milk. When your baby is born, your hormones send signals that tell your body to make more breast milk and to release your breast milk. In some cases, however, you may want to breastfeed even though you did not become pregnant and you did not give birth to your baby. In this case, your breast milk will need to be induced. Induced lactation is a process in which a woman who is not producing breast milk is made to produce it. Induced lactation may be done in cases of: Adoption. Having another woman give birth to your baby (surrogacy). Female same-sex couples who have a newborn. In these cases, one person may have given birth, and the other may want to make breast milk to help with feedings. You may also have induced lactation if you are restarting breastfeeding after stopping it for a period of time (relactation). Relactation is possible and often can be done without the use of medicines. Induced lactation is more likely to be successful in women who have been pregnant before. How does induced lactation work? Induced lactation reproduces the process that the body naturally goes through to make breast milk. To help make breast milk, you may need to take medicines and practice breast stimulation techniques. To do this, you will follow this schedule: 3-4 months before breastfeeding start date, begin taking medicines. Stop taking these medicines about 6 weeks before the planned breastfeeding date. About 6 weeks before the planned breastfeeding date, start doing breast stimulation techniques several times a day. Breast stimulation mimics a baby suckling at the breast. You can do this by: Gently rubbing and stretching your nipples by hand. Pumping your breasts using a hospital-grade double electric breast pump. You may need to  pump or rub both breasts several times throughout the day. For example, you may need to pump or rub at the same time every 3 hours for 20 minutes, for a total of 8 times a day. When your body is making milk and you start breastfeeding, your body will increase the amount of milk it makes. Your body does this naturally as milk is removed from your body, and as you sense changes in how your baby smells, sounds, and feels. How does induced lactation affect me? Induced lactation may cause you to experience some changes in your body, such as: Mild to moderate changes in your monthly periods (menstrual cycle). Some breast changes that include a feeling of fullness. Some changes in your breast shape. Milk leaking from your breasts at times. Will I make enough milk to feed my baby? Very few women can make all the milk their babies need. If you choose induced lactation, you may need to supplement feedings with donated breast milk or infant formula to make sure your baby gets enough nutrition. Supplemental nursing systems are available to give extra donated breast milk or formula at the breast while a baby nurses. These systems make sure that a baby gets enough nutrition during breastfeeding. Ask a breastfeeding specialist (lactationconsultant) for help finding and using this system. Babies younger than one month old usually root at and accept the breast when supplemental nursing systems are used. Rooting is when a baby turns his or her head and opens his or her mouth upon being stroked on the cheek or lips. Follow these instructions at home: Medicines Take over-the-counter  and prescription medicines only as told by your health care provider or trained lactation consultant. Always check with your health care provider before using any herbal medicines. General instructions If you need guidance, talk to your health care provider or lactation consultant. He or she may be able to help you start a milk supply and  advise you in making important decisions about feeding your baby. Keep all follow-up visits. This is important. Where to find more information [employer]: llli.org American Academy of Pediatrics: healthychildren.org Contact a health care provider if: Your breasts become swollen, red, and tender. You develop a fever. Your baby is older than 44 days old and he or she: Does not seem satisfied after feeding at the breast. Is not producing 5-6 wet diapers a day. Is not producing 3 stools a day. Is not gaining weight. Is very tired (lethargic) or very sleepy. Summary Induced lactation is a process in which a woman who is not producing breast milk is made to produce it. Pregnancy naturally prepares the breasts to make breast milk. Lactation is usually induced by taking medicines and practicing breast stimulation techniques. Very few women can make all the milk their babies need through inducing lactation. You may need to supplement feedings with donated breast milk or infant formula to make sure your baby gets enough nutrition. This information is not intended to replace advice given to you by your health care provider. Make sure you discuss any questions you have with your health care provider. Document Revised: 02/23/2020 Document Reviewed: 02/23/2020 Elsevier Patient Education  2022 [employer].

## 2021-07-31 NOTE — Progress Notes (Signed)
° °  OBSTETRICS POSTPARTUM CLINIC PROGRESS NOTE  Subjective:     Mariah Richardson is a 36 y.o. [id number] female who presents for a postpartum visit. She is 6 weeks postpartum following a spontaneous vaginal delivery. I have fully reviewed the prenatal and intrapartum course. The delivery was at 38.6 gestational weeks, induction for non-reassuring fetal tracing and PROM.  Anesthesia: epidural. Postpartum course has been well. Baby's course has been well. Baby is feeding by bottle - Similac Advance. Bleeding: patient has not not resumed menses, with No LMP recorded. Bowel function is normal. Bladder function is normal. Patient is not sexually active. Contraception method desired is abstinence. Postpartum depression screening: negative.  EDPS score is 0, negative.   Patient notes that she is disappointed about no longer being able to breastfeed.  Notes that her milk supply decreased and did not increase despite use of lactational supplements and increased pumping frequency. Was seen and evaluated by lactation consultant during that time.   The following portions of the patient's history were reviewed and updated as appropriate: allergies, current medications, past family history, past medical history, past social history, past surgical history, and problem list.  Review of Systems Pertinent items noted in HPI and remainder of comprehensive ROS otherwise negative.   Objective:    BP 115/74    Pulse 98    Ht 5\' 2"  (1.575 m)    Wt 228 lb (103.4 kg)    SpO2 98%    Breastfeeding No Comment: formula   BMI 41.70 kg/m   General:  alert and no distress   Breasts:  inspection negative, no nipple discharge or bleeding, no masses or nodularity palpable  Lungs: clear to auscultation bilaterally  Heart:  regular rate and rhythm, S1, S2 normal, no murmur, click, rub or gallop  Abdomen: soft, non-tender; bowel sounds normal; no masses,  no organomegaly.     Vulva:  normal  Vagina: normal vagina, no discharge,  exudate, lesion, or erythema  Cervix:  no cervical motion tenderness and no lesions  Corpus: normal size, contour, position, consistency, mobility, non-tender  Adnexa:  normal adnexa and no mass, fullness, tenderness  Rectal Exam: Not performed.         Labs:  Lab Results  Component Value Date   HGB 12.1 06/11/2021     Assessment:   1. Postpartum care following vaginal delivery   2. Lactation problem   3. Cervical cancer screening      Plan:   1. Contraception: abstinence 2. Discussed option of use of Reglan to help increase milk supply. Will give 2 week trial. If supply increases, can utilize for 4-6 months. Also encouraged to resume pumping q 2-3 hours and significantly increasing hydration.  3. Pap smear performed today for cervical screening.  4. Follow up in: 6  months for annual exam, or sooner  as needed.    Rubie Maid, MD [facility]

## 2021-08-08 ENCOUNTER — Encounter: Payer: Self-pay | Admitting: Obstetrics and Gynecology

## 2021-08-08 LAB — CYTOLOGY - PAP
Comment: NEGATIVE
Comment: NEGATIVE
Diagnosis: UNDETERMINED — AB
HPV 16: NEGATIVE
HPV 18 / 45: NEGATIVE
High risk HPV: POSITIVE — AB

## 2021-08-30 ENCOUNTER — Encounter: Payer: Medicaid Other | Admitting: Obstetrics and Gynecology

## 2021-08-31 ENCOUNTER — Ambulatory Visit (INDEPENDENT_AMBULATORY_CARE_PROVIDER_SITE_OTHER): Payer: Medicaid Other | Admitting: Obstetrics and Gynecology

## 2021-08-31 ENCOUNTER — Other Ambulatory Visit (HOSPITAL_COMMUNITY)
Admission: RE | Admit: 2021-08-31 | Discharge: 2021-08-31 | Disposition: A | Discharge status: Home / Self Care | Payer: Medicaid Other | Source: Ambulatory Visit | Attending: Obstetrics and Gynecology | Admitting: Obstetrics and Gynecology

## 2021-08-31 ENCOUNTER — Encounter: Payer: Self-pay | Admitting: Obstetrics and Gynecology

## 2021-08-31 ENCOUNTER — Other Ambulatory Visit: Payer: Self-pay

## 2021-08-31 VITALS — BP 114/60 | HR 79 | Ht 62.0 in | Wt 232.0 lb

## 2021-08-31 DIAGNOSIS — R87619 Unspecified abnormal cytological findings in specimens from cervix uteri: Secondary | ICD-10-CM | POA: Insufficient documentation

## 2021-08-31 DIAGNOSIS — Z3202 Encounter for pregnancy test, result negative: Secondary | ICD-10-CM

## 2021-08-31 LAB — POCT URINE PREGNANCY: Preg Test, Ur: NEGATIVE

## 2021-08-31 NOTE — Progress Notes (Signed)
° ° ° °  GYNECOLOGY OFFICE COLPOSCOPY PROCEDURE NOTE  36 y.o. 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 Discussed role for HPV in cervical dysplasia, need for surveillance.   Patient gave informed written consent, time out was performed. Urine pregnancy test was negative. Placed in lithotomy position. Cervix viewed with speculum and colposcope after application of acetic acid.   Colposcopy adequate? Yes, with use of endocervical speculum.  no visible lesions, no mosaicism, no punctation, and no abnormal vasculature; no biopsies obtained.  ECC specimen obtained. All specimens were labeled and sent to pathology.  Chaperone was present during entire procedure.  Patient was given post procedure instructions.  Will follow up pathology and manage accordingly; patient will be contacted with results and recommendations.  Routine preventative health maintenance measures emphasized.   Hildred Laser, MD [facility]

## 2021-08-31 NOTE — Progress Notes (Signed)
° °   Patient is a 36 y.o. [id number] female who presents for Colposcopy.

## 2021-08-31 NOTE — Addendum Note (Signed)
Addended by: Elta Guadeloupe on: 08/31/2021 11:16 AM   Modules accepted: Orders

## 2021-09-04 LAB — SURGICAL PATHOLOGY

## 2021-12-03 ENCOUNTER — Encounter: Payer: Self-pay | Admitting: Obstetrics and Gynecology

## 2021-12-20 ENCOUNTER — Encounter: Payer: Medicaid Other | Admitting: Obstetrics and Gynecology

## 2021-12-26 ENCOUNTER — Other Ambulatory Visit: Payer: Self-pay | Admitting: Advanced Practice Midwife

## 2021-12-26 ENCOUNTER — Encounter: Payer: Medicaid Other | Admitting: Obstetrics and Gynecology

## 2021-12-26 DIAGNOSIS — O219 Vomiting of pregnancy, unspecified: Secondary | ICD-10-CM

## 2022-01-29 DIAGNOSIS — M5431 Sciatica, right side: Secondary | ICD-10-CM | POA: Insufficient documentation

## 2022-02-05 ENCOUNTER — Encounter: Payer: Medicaid Other | Admitting: Obstetrics and Gynecology

## 2022-02-26 ENCOUNTER — Other Ambulatory Visit: Payer: Self-pay

## 2022-02-26 ENCOUNTER — Emergency Department: Payer: Medicaid Other

## 2022-02-26 ENCOUNTER — Encounter: Payer: Self-pay | Admitting: Emergency Medicine

## 2022-02-26 ENCOUNTER — Emergency Department
Admission: EM | Admit: 2022-02-26 | Discharge: 2022-02-26 | Disposition: A | Discharge status: Home / Self Care | Payer: Medicaid Other | Attending: Emergency Medicine | Admitting: Emergency Medicine

## 2022-02-26 DIAGNOSIS — R42 Dizziness and giddiness: Secondary | ICD-10-CM | POA: Diagnosis not present

## 2022-02-26 DIAGNOSIS — I10 Essential (primary) hypertension: Secondary | ICD-10-CM | POA: Insufficient documentation

## 2022-02-26 DIAGNOSIS — R519 Headache, unspecified: Secondary | ICD-10-CM | POA: Diagnosis not present

## 2022-02-26 DIAGNOSIS — H53149 Visual discomfort, unspecified: Secondary | ICD-10-CM | POA: Insufficient documentation

## 2022-02-26 MED ORDER — ONDANSETRON HCL 4 MG/2ML IJ SOLN
4.0000 mg | Freq: Once | INTRAMUSCULAR | Status: AC
  Administered 2022-02-26: 4 mg via INTRAVENOUS
  Filled 2022-02-26: qty 2

## 2022-02-26 MED ORDER — KETOROLAC TROMETHAMINE 15 MG/ML IJ SOLN
15.0000 mg | Freq: Once | INTRAMUSCULAR | Status: AC
  Administered 2022-02-26: 15 mg via INTRAVENOUS
  Filled 2022-02-26: qty 1

## 2022-02-26 MED ORDER — PROCHLORPERAZINE EDISYLATE 10 MG/2ML IJ SOLN
10.0000 mg | Freq: Once | INTRAMUSCULAR | Status: AC
  Administered 2022-02-26: 10 mg via INTRAVENOUS
  Filled 2022-02-26: qty 2

## 2022-02-26 MED ORDER — DEXAMETHASONE SODIUM PHOSPHATE 10 MG/ML IJ SOLN
10.0000 mg | Freq: Once | INTRAMUSCULAR | Status: AC
  Administered 2022-02-26: 10 mg via INTRAVENOUS
  Filled 2022-02-26: qty 1

## 2022-02-26 MED ORDER — SODIUM CHLORIDE 0.9 % IV BOLUS
1000.0000 mL | Freq: Once | INTRAVENOUS | Status: AC
  Administered 2022-02-26: 1000 mL via INTRAVENOUS

## 2022-02-26 NOTE — Discharge Instructions (Addendum)
-  Continue to take Tylenol and ibuprofen as needed.  -Please follow-up with the neurologist listed in these instructions if your headaches persist.  -Return to the emergency department anytime if you begin to experience any new or worsening symptoms.

## 2022-02-26 NOTE — ED Notes (Signed)
Pt A&O, IV removed, pt given discharge instructions, pt ambulating with steady gait. 

## 2022-02-26 NOTE — ED Triage Notes (Addendum)
Patient to ED for headache x1 month. Patient states pain has been worsening and has light sensitivity, N/V, and dizziness. No medications helping.

## 2022-02-26 NOTE — ED Provider Notes (Signed)
[facility] Provider Note    Event Date/Time   First MD Initiated Contact with Patient 02/26/22 1759     (approximate)   History   Chief Complaint Headache   HPI Mariah Richardson is a 36 y.o. female, history of anxiety, obesity, chronic migraines, marijuana use, presents emergency department for evaluation of headache x 1 month.  Reports global headache with photophobia, nausea, and intermittent dizziness.  She has reportedly been taken Tylenol and ibuprofen, however has not helping.  She states that she used to have certain migraine medications that she would take, however she has not taken them in a long time.  Denies fever/chills, chest pain, shortness of breath, neck pain, abdominal pain, flank pain, vision changes, hearing changes, rash/lesions, or numbness/tingling upper or lower extremities.  Denies any recent falls or injuries.  History Limitations: No limitations.        Physical Exam  Triage Vital Signs: ED Triage Vitals  Enc Vitals Group     BP 02/26/22 1711 133/86     Pulse Rate 02/26/22 1711 72     Resp 02/26/22 1711 18     Temp 02/26/22 1711 98.4 F (36.9 C)     Temp Source 02/26/22 1711 Oral     SpO2 02/26/22 1711 100 %     Weight 02/26/22 1712 239 lb (108.4 kg)     Height 02/26/22 1712 5\' 2"  (1.575 m)     Head Circumference --      Peak Flow --      Pain Score 02/26/22 1712 10     Pain Loc --      Pain Edu? --      Excl. in GC? --     Most recent vital signs: Vitals:   02/26/22 1711 02/26/22 2013  BP: 133/86 125/60  Pulse: 72 76  Resp: 18 18  Temp: 98.4 F (36.9 C) 98.2 F (36.8 C)  SpO2: 100% 97%    General: Awake, NAD.  Skin: Warm, dry. No rashes or lesions.  Eyes: PERRL. Conjunctivae normal.  EOMI. CV: Good peripheral perfusion.  Resp: Normal effort.  Abd: Soft, non-tender. No distention.  Neuro: At baseline. No gross neurological deficits.  No facial droop.  Cranial nerves II through XII intact.  5/5 strength  and sensation in upper and lower extremities.  Focused Exam: N/A.  Physical Exam    ED Results / Procedures / Treatments  Labs (all labs ordered are listed, but only abnormal results are displayed) Labs Reviewed  POC URINE PREG, ED     EKG N/A.   RADIOLOGY  ED Provider Interpretation: I personally viewed and interpreted the CT scan, no evidence of acute intracranial otherwise.  CT Head Wo Contrast  Result Date: 02/26/2022 CLINICAL DATA:  Headache, chronic, new features or increased frequency. Light sensitivity. Nausea and vomiting. Dizziness. EXAM: CT HEAD WITHOUT CONTRAST TECHNIQUE: Contiguous axial images were obtained from the base of the skull through the vertex without intravenous contrast. RADIATION DOSE REDUCTION: This exam was performed according to the departmental dose-optimization program which includes automated exposure control, adjustment of the mA and/or kV according to patient size and/or use of iterative reconstruction technique. COMPARISON:  07/14/2012 FINDINGS: Brain: The brain shows a normal appearance without evidence of malformation, atrophy, old or acute small or large vessel infarction, mass lesion, hemorrhage, hydrocephalus or extra-axial collection. Vascular: No hyperdense vessel. No evidence of atherosclerotic calcification. Skull: Normal.  No traumatic finding.  No focal bone lesion. Sinuses/Orbits: Sinuses are clear.  Orbits appear normal. Mastoids are clear. Other: None significant IMPRESSION: Normal head CT. Electronically Signed   By: Paulina Fusi M.D.   On: 02/26/2022 18:44    PROCEDURES:  Critical Care performed: N/A.  Procedures    MEDICATIONS ORDERED IN ED: Medications  ondansetron (ZOFRAN) injection 4 mg (4 mg Intravenous Given 02/26/22 1903)  sodium chloride 0.9 % bolus 1,000 mL (0 mLs Intravenous Stopped 02/26/22 2013)  dexamethasone (DECADRON) injection 10 mg (10 mg Intravenous Given 02/26/22 1904)  ketorolac (TORADOL) 15 MG/ML injection 15 mg  (15 mg Intravenous Given 02/26/22 1947)  prochlorperazine (COMPAZINE) injection 10 mg (10 mg Intravenous Given 02/26/22 1947)     IMPRESSION / MDM / ASSESSMENT AND PLAN / ED COURSE  I reviewed the triage vital signs and the nursing notes.                              Differential diagnosis includes, but is not limited to, tension headache, migraine headache, cluster headache.    Assessment/Plan Presentation consistent with migraine headache.  Head CT does not show any acute intracranial abnormalities.  Clinically she appears well.  Treated her here with IV fluids, ketorolac, Compazine, dexamethasone, and ondansetron.  She responded well to treatment.  Will plan to discharge her with a referral to neurology for chronic migraine management.    Provided the patient with anticipatory guidance, return precautions, and educational material. Encouraged the patient to return to the emergency department at any time if they begin to experience any new or worsening symptoms. Patient expressed understanding and agreed with the plan.   Patient's presentation is most consistent with acute complicated illness / injury requiring diagnostic workup.       FINAL CLINICAL IMPRESSION(S) / ED DIAGNOSES   Final diagnoses:  Acute nonintractable headache, unspecified headache type     Rx / DC Orders   ED Discharge Orders     None        Note:  This document was prepared using Dragon voice recognition software and may include unintentional dictation errors.   Varney Daily, PA 02/27/22 0102    Merwyn Katos, MD 02/28/22 [phone]

## 2022-02-26 NOTE — ED Provider Notes (Signed)
[facility] Provider Note    Event Date/Time   First MD Initiated Contact with Patient 02/26/22 1759     (approximate)   History   Headache   HPI  Mariah Richardson is a 36 y.o. female presents to the emergency department for treatment and evaluation of headache that has been present intermittently for the past month.  This episode of headache started 2 days ago and has not been relieved with the, or BC powders.  She is also complaining of photo and phonophobia.  She denies any head injury.  The left forehead and then radiates diffusely.  Past Medical History:  Diagnosis Date   Gastritis    Hypertension      Physical Exam   Triage Vital Signs: ED Triage Vitals  Enc Vitals Group     BP 02/26/22 1711 133/86     Pulse Rate 02/26/22 1711 72     Resp 02/26/22 1711 18     Temp 02/26/22 1711 98.4 F (36.9 C)     Temp Source 02/26/22 1711 Oral     SpO2 02/26/22 1711 100 %     Weight 02/26/22 1712 239 lb (108.4 kg)     Height 02/26/22 1712 5\' 2"  (1.575 m)     Head Circumference --      Peak Flow --      Pain Score 02/26/22 1712 10     Pain Loc --      Pain Edu? --      Excl. in GC? --     Most recent vital signs: Vitals:   02/26/22 1711  BP: 133/86  Pulse: 72  Resp: 18  Temp: 98.4 F (36.9 C)  SpO2: 100%    General: Awake, no distress.  CV:  Good peripheral perfusion.  Resp:  Normal effort.  Abd:  No distention.  Other:  Awake, alert, oriented.  No focal weakness.  Pupils equal, round, reactive, brisk   ED Results / Procedures / Treatments   Labs (all labs ordered are listed, but only abnormal results are displayed) Labs Reviewed  POC URINE PREG, ED     EKG  Not indicated.   RADIOLOGY  CT head negative for acute concerns.  I have independently reviewed and interpreted imaging as well as reviewed report from radiology.  PROCEDURES:  Critical Care performed: No  Procedures   MEDICATIONS ORDERED IN  ED:  Medications  ondansetron (ZOFRAN) injection 4 mg (4 mg Intravenous Given 02/26/22 1903)  sodium chloride 0.9 % bolus 1,000 mL (1,000 mLs Intravenous New Bag/Given 02/26/22 1903)  dexamethasone (DECADRON) injection 10 mg (10 mg Intravenous Given 02/26/22 1904)  ketorolac (TORADOL) 15 MG/ML injection 15 mg (15 mg Intravenous Given 02/26/22 1947)  prochlorperazine (COMPAZINE) injection 10 mg (10 mg Intravenous Given 02/26/22 1947)     IMPRESSION / MDM / ASSESSMENT AND PLAN / ED COURSE   I reviewed the triage vital signs and the nursing notes.  Differential diagnosis includes, but is not limited to: Migraine, intracranial pathology, tension headache.  Patient's presentation is most consistent with acute complicated illness / injury requiring diagnostic workup.  36 year old female presenting to the emergency department for treatment and evaluation of headache that has become recurrent over the past month.  See HPI for further details.  Because the headache is new in character we will get CT of her head.  She otherwise has no red flags.  Meds ordered.  Care transitioned to B. 20, PA-C who will evaluate after medications.  FINAL CLINICAL IMPRESSION(S) / ED DIAGNOSES   Final diagnoses:  Acute nonintractable headache, unspecified headache type     Rx / DC Orders   ED Discharge Orders     None        Note:  This document was prepared using Dragon voice recognition software and may include unintentional dictation errors.   Chinita Pester, FNP 02/26/22 1952    Merwyn Katos, MD 02/28/22 [phone]

## 2022-03-05 DIAGNOSIS — Z713 Dietary counseling and surveillance: Secondary | ICD-10-CM | POA: Diagnosis not present

## 2022-03-05 DIAGNOSIS — Z6841 Body Mass Index (BMI) 40.0 and over, adult: Secondary | ICD-10-CM | POA: Diagnosis not present

## 2022-03-05 DIAGNOSIS — G43009 Migraine without aura, not intractable, without status migrainosus: Secondary | ICD-10-CM | POA: Diagnosis not present

## 2022-03-05 DIAGNOSIS — N760 Acute vaginitis: Secondary | ICD-10-CM | POA: Diagnosis not present

## 2022-03-06 DIAGNOSIS — N760 Acute vaginitis: Secondary | ICD-10-CM | POA: Diagnosis not present

## 2022-03-08 DIAGNOSIS — E66813 Obesity, class 3: Secondary | ICD-10-CM | POA: Insufficient documentation

## 2022-03-11 ENCOUNTER — Telehealth: Payer: Medicaid Other | Admitting: Physician Assistant

## 2022-03-11 DIAGNOSIS — R3989 Other symptoms and signs involving the genitourinary system: Secondary | ICD-10-CM

## 2022-03-11 DIAGNOSIS — Z202 Contact with and (suspected) exposure to infections with a predominantly sexual mode of transmission: Secondary | ICD-10-CM | POA: Diagnosis not present

## 2022-03-11 MED ORDER — SULFAMETHOXAZOLE-TRIMETHOPRIM 800-160 MG PO TABS: 1.0000 | ORAL_TABLET | Freq: Two times a day (BID) | ORAL | 0 refills | Status: DC

## 2022-03-11 NOTE — Progress Notes (Signed)
Virtual Visit Consent   Mariah Richardson, you are scheduled for a virtual visit with a [facility] provider today. Just as with appointments in the office, your consent must be obtained to participate. Your consent will be active for this visit and any virtual visit you may have with one of our providers in the next 365 days. If you have a MyChart account, a copy of this consent can be sent to you electronically.  As this is a virtual visit, video technology does not allow for your provider to perform a traditional examination. This may limit your provider's ability to fully assess your condition. If your provider identifies any concerns that need to be evaluated in person or the need to arrange testing (such as labs, EKG, etc.), we will make arrangements to do so. Although advances in technology are sophisticated, we cannot ensure that it will always work on either your end or our end. If the connection with a video visit is poor, the visit may have to be switched to a telephone visit. With either a video or telephone visit, we are not always able to ensure that we have a secure connection.  By engaging in this virtual visit, you consent to the provision of healthcare and authorize for your insurance to be billed (if applicable) for the services provided during this visit. Depending on your insurance coverage, you may receive a charge related to this service.  I need to obtain your verbal consent now. Are you willing to proceed with your visit today? ZOIEE WIMMER has provided verbal consent on 03/11/2022 for a virtual visit (video or telephone). Margaretann Loveless, PA-C  Date: 03/11/2022 2:23 PM  Virtual Visit via Video Note   I, Margaretann Loveless, connected with  Mariah Richardson  ([id number], Apr 23, 1986) on 03/11/22 at  2:15 PM EDT by a video-enabled telemedicine application and verified that I am speaking with the correct person using two identifiers.  Location: Patient: Virtual Visit  Location Patient: Home Provider: Virtual Visit Location Provider: Home Office   I discussed the limitations of evaluation and management by telemedicine and the availability of in person appointments. The patient expressed understanding and agreed to proceed.    History of Present Illness: Mariah Richardson is a 36 y.o. who identifies as a female who was assigned female at birth, and is being seen today for RLQ abd pain and back pain.   HPI: Abdominal Pain This is a new problem. The current episode started 1 to 4 weeks ago (Was treated for BV but did not have symptom improvement and feels she may have a UTI). The onset quality is sudden. The problem occurs constantly. The problem has been unchanged. The pain is located in the RLQ. The quality of the pain is aching. The abdominal pain radiates to the back. Associated symptoms include dysuria, frequency and nausea. Pertinent negatives include no constipation, diarrhea, fever, hematochezia, hematuria, melena or vomiting. Associated symptoms comments: Urine hesitancy.    She does mention at the end of the visit that she may have been exposed to chlamydia as well. Unsure if this is related.   Problems:  Patient Active Problem List   Diagnosis Date Noted   Non-reassuring electronic fetal monitoring tracing 06/11/2021   Labor and delivery, indication for care 05/24/2021   Indication for care in labor or delivery 05/14/2021   Back pain affecting pregnancy in third trimester 05/14/2021   Nausea 05/14/2021   Decreased fetal movements in third trimester  Syncopal episodes 05/03/2021   Pelvic pain in pregnancy, antepartum, third trimester 05/03/2021   [redacted] weeks gestation of pregnancy    Supervision of high risk pregnancy, antepartum 11/06/2020   Obesity affecting pregnancy in third trimester 11/06/2020   Chronic migraine 07/30/2019   History of marijuana use 06/30/2017   Biological false positive RPR test 02/28/2017   Anxiety 10/03/2015     Allergies:  Allergies  Allergen Reactions   Amoxicillin    Latex    Penicillins    Medications:  Current Outpatient Medications:    sulfamethoxazole-trimethoprim (BACTRIM DS) 800-160 MG tablet, Take 1 tablet by mouth 2 (two) times daily., Disp: 10 tablet, Rfl: 0   ibuprofen (ADVIL) 600 MG tablet, Take 1 tablet (600 mg total) by mouth every 8 (eight) hours as needed., Disp: 60 tablet, Rfl: 1   metoCLOPramide (REGLAN) 10 MG tablet, Take 1 tablet (10 mg total) by mouth 4 (four) times daily for 14 days., Disp: 56 tablet, Rfl: 0  Observations/Objective: Patient is well-developed, well-nourished in no acute distress.  Resting comfortably at home.  Head is normocephalic, atraumatic.  No labored breathing.  Speech is clear and coherent with logical content.  Patient is alert and oriented at baseline.    Assessment and Plan: 1. Suspected UTI - sulfamethoxazole-trimethoprim (BACTRIM DS) 800-160 MG tablet; Take 1 tablet by mouth 2 (two) times daily.  Dispense: 10 tablet; Refill: 0  2. Exposure to chlamydia  - Worsening symptoms.  - Will treat empirically with Bactrim - May use AZO for bladder spasms - Continue to push fluids.  - Advised we could not treat for any STI without testing. She voiced understanding and agrees to go to a local UC or health department for testing - Seek in person evaluation for urine culture if symptoms do not improve or if they worsen.    Follow Up Instructions: I discussed the assessment and treatment plan with the patient. The patient was provided an opportunity to ask questions and all were answered. The patient agreed with the plan and demonstrated an understanding of the instructions.  A copy of instructions were sent to the patient via MyChart unless otherwise noted below.    The patient was advised to call back or seek an in-person evaluation if the symptoms worsen or if the condition fails to improve as anticipated.  Time:  I spent 10 minutes with  the patient via telehealth technology discussing the above problems/concerns.    Margaretann Loveless, PA-C

## 2022-03-11 NOTE — Patient Instructions (Signed)
Mariah Richardson, thank you for joining Margaretann Loveless, PA-C for today's virtual visit.  While this provider is not your primary care provider (PCP), if your PCP is located in our provider database this encounter information will be shared with them immediately following your visit.  Consent: (Patient) Mariah Richardson provided verbal consent for this virtual visit at the beginning of the encounter.  Current Medications:  Current Outpatient Medications:    sulfamethoxazole-trimethoprim (BACTRIM DS) 800-160 MG tablet, Take 1 tablet by mouth 2 (two) times daily., Disp: 10 tablet, Rfl: 0   ibuprofen (ADVIL) 600 MG tablet, Take 1 tablet (600 mg total) by mouth every 8 (eight) hours as needed., Disp: 60 tablet, Rfl: 1   metoCLOPramide (REGLAN) 10 MG tablet, Take 1 tablet (10 mg total) by mouth 4 (four) times daily for 14 days., Disp: 56 tablet, Rfl: 0   Medications ordered in this encounter:  Meds ordered this encounter  Medications   sulfamethoxazole-trimethoprim (BACTRIM DS) 800-160 MG tablet    Sig: Take 1 tablet by mouth 2 (two) times daily.    Dispense:  10 tablet    Refill:  0    Order Specific Question:   Supervising Provider    Answer:   Hyacinth Meeker, BRIAN [[id number]]     *If you need refills on other medications prior to your next appointment, please contact your pharmacy*  Follow-Up: Call back or seek an in-person evaluation if the symptoms worsen or if the condition fails to improve as anticipated.  Other Instructions Urinary Tract Infection, Adult A urinary tract infection (UTI) is an infection of any part of the urinary tract. The urinary tract includes: The kidneys. The ureters. The bladder. The urethra. These organs make, store, and get rid of pee (urine) in the body. What are the causes? This infection is caused by germs (bacteria) in your genital area. These germs grow and cause swelling (inflammation) of your urinary tract. What increases the risk? The following  factors may make you more likely to develop this condition: Using a small, thin tube (catheter) to drain pee. Not being able to control when you pee or poop (incontinence). Being female. If you are female, these things can increase the risk: Using these methods to prevent pregnancy: A medicine that kills sperm (spermicide). A device that blocks sperm (diaphragm). Having low levels of a female hormone (estrogen). Being pregnant. You are more likely to develop this condition if: You have genes that add to your risk. You are sexually active. You take antibiotic medicines. You have trouble peeing because of: A prostate that is bigger than normal, if you are female. A blockage in the part of your body that drains pee from the bladder. A kidney stone. A nerve condition that affects your bladder. Not getting enough to drink. Not peeing often enough. You have other conditions, such as: Diabetes. A weak disease-fighting system (immune system). Sickle cell disease. Gout. Injury of the spine. What are the signs or symptoms? Symptoms of this condition include: Needing to pee right away. Peeing small amounts often. Pain or burning when peeing. Blood in the pee. Pee that smells bad or not like normal. Trouble peeing. Pee that is cloudy. Fluid coming from the vagina, if you are female. Pain in the belly or lower back. Other symptoms include: Vomiting. Not feeling hungry. Feeling mixed up (confused). This may be the first symptom in older adults. Being tired and grouchy (irritable). A fever. Watery poop (diarrhea). How is this treated? Taking antibiotic medicine.  Taking other medicines. Drinking enough water. In some cases, you may need to see a specialist. Follow these instructions at home:  Medicines Take over-the-counter and prescription medicines only as told by your doctor. If you were prescribed an antibiotic medicine, take it as told by your doctor. Do not stop taking it  even if you start to feel better. General instructions Make sure you: Pee until your bladder is empty. Do not hold pee for a long time. Empty your bladder after sex. Wipe from front to back after peeing or pooping if you are a female. Use each tissue one time when you wipe. Drink enough fluid to keep your pee pale yellow. Keep all follow-up visits. Contact a doctor if: You do not get better after 1-2 days. Your symptoms go away and then come back. Get help right away if: You have very bad back pain. You have very bad pain in your lower belly. You have a fever. You have chills. You feeling like you will vomit or you vomit. Summary A urinary tract infection (UTI) is an infection of any part of the urinary tract. This condition is caused by germs in your genital area. There are many risk factors for a UTI. Treatment includes antibiotic medicines. Drink enough fluid to keep your pee pale yellow. This information is not intended to replace advice given to you by your health care provider. Make sure you discuss any questions you have with your health care provider. Document Revised: 02/18/2020 Document Reviewed: 02/18/2020 Elsevier Patient Education  2023 Elsevier Inc.    If you have been instructed to have an in-person evaluation today at a local Urgent Care facility, please use the link below. It will take you to a list of all of our available [facility], including address, phone number and hours of operation. Please do not delay care.  [facility]  If you or a family member do not have a primary care provider, use the link below to schedule a visit and establish care. When you choose a [facility] primary care physician or advanced practice provider, you gain a long-term partner in health. Find a Primary Care Provider  Learn more about [facility]'s in-office and virtual care options: [facility] - Get Care Now

## 2022-03-12 ENCOUNTER — Ambulatory Visit
Admission: RE | Admit: 2022-03-12 | Discharge: 2022-03-12 | Disposition: A | Discharge status: Home / Self Care | Payer: Medicaid Other | Source: Ambulatory Visit | Attending: Emergency Medicine | Admitting: Emergency Medicine

## 2022-03-12 VITALS — BP 110/76 | HR 94 | Temp 98.4°F | Resp 18 | Ht 62.0 in | Wt 239.0 lb

## 2022-03-12 DIAGNOSIS — Z113 Encounter for screening for infections with a predominantly sexual mode of transmission: Secondary | ICD-10-CM | POA: Diagnosis not present

## 2022-03-12 DIAGNOSIS — R1030 Lower abdominal pain, unspecified: Secondary | ICD-10-CM

## 2022-03-12 DIAGNOSIS — M545 Low back pain, unspecified: Secondary | ICD-10-CM | POA: Diagnosis not present

## 2022-03-12 DIAGNOSIS — R300 Dysuria: Secondary | ICD-10-CM

## 2022-03-12 LAB — POCT URINALYSIS DIP (MANUAL ENTRY)
Bilirubin, UA: NEGATIVE
Blood, UA: NEGATIVE
Glucose, UA: NEGATIVE mg/dL
Ketones, POC UA: NEGATIVE mg/dL
Nitrite, UA: NEGATIVE
Protein Ur, POC: NEGATIVE mg/dL
Spec Grav, UA: 1.015 (ref 1.010–1.025)
Urobilinogen, UA: 0.2 E.U./dL
pH, UA: 6.5 (ref 5.0–8.0)

## 2022-03-12 LAB — POCT URINE PREGNANCY: Preg Test, Ur: NEGATIVE

## 2022-03-12 NOTE — Discharge Instructions (Addendum)
Go to the emergency department if you have worsening abdominal pain or other concerning symptoms.  The urine culture is pending.  Continue taking the Bactrim DS as directed.  Your STD tests are pending.  If your test results are positive, we will call you.  You and your sexual partner(s) may require treatment at that time.  Do not have sexual activity for at least 7 days.    Follow up with your primary care provider.

## 2022-03-12 NOTE — ED Provider Notes (Signed)
Mariah Richardson    CSN: [id number] Arrival date & time: 03/12/22  0818      History   Chief Complaint Chief Complaint  Patient presents with   SEXUALLY TRANSMITTED DISEASE    Std full panel and uti - Entered by patient    HPI Mariah Richardson is a 36 y.o. female.  Patient presents with 1 week history of dysuria, lower abdominal pain, right lower back pain, white vaginal discharge.  No fever, chills, rash, hematuria, pelvic pain, or other symptoms.  Patient had a telemedicine visit with [facility] on 03/05/2022; she was treated with metronidazole on 03/06/2022 for bacterial vaginitis.  She had a video visit yesterday with [facility] and was treated with Bactrim DS for suspected UTI; she was instructed to be seen in person for possible exposure to chlamydia.  She is here today for STD testing.  Her medical history includes hypertension, chronic migraine, syncopal episodes, gastritis.  The history is provided by the patient and medical records.    Past Medical History:  Diagnosis Date   Gastritis    Hypertension     Patient Active Problem List   Diagnosis Date Noted   Non-reassuring electronic fetal monitoring tracing 06/11/2021   Labor and delivery, indication for care 05/24/2021   Indication for care in labor or delivery 05/14/2021   Back pain affecting pregnancy in third trimester 05/14/2021   Nausea 05/14/2021   Decreased fetal movements in third trimester    Syncopal episodes 05/03/2021   Pelvic pain in pregnancy, antepartum, third trimester 05/03/2021   [redacted] weeks gestation of pregnancy    Supervision of high risk pregnancy, antepartum 11/06/2020   Obesity affecting pregnancy in third trimester 11/06/2020   Chronic migraine 07/30/2019   History of marijuana use 06/30/2017   Biological false positive RPR test 02/28/2017   Anxiety 10/03/2015    Past Surgical History:  Procedure Laterality Date   APPENDECTOMY     CHOLECYSTECTOMY      OB History     Gravida  5    Para  4   Term  4   Preterm      AB  1   Living  4      SAB      IAB      Ectopic      Multiple  0   Live Births  4            Home Medications    Prior to Admission medications   Medication Sig Start Date End Date Taking? Authorizing Provider  ibuprofen (ADVIL) 600 MG tablet Take 1 tablet (600 mg total) by mouth every 8 (eight) hours as needed. 06/12/21   Hildred Laser, MD  metoCLOPramide (REGLAN) 10 MG tablet Take 1 tablet (10 mg total) by mouth 4 (four) times daily for 14 days. 07/31/21 08/14/21  Hildred Laser, MD  sulfamethoxazole-trimethoprim (BACTRIM DS) 800-160 MG tablet Take 1 tablet by mouth 2 (two) times daily. 03/11/22   Margaretann Loveless, PA-C    Family History Family History  Problem Relation Age of Onset   Diabetes Mother    Migraines Father    Diverticulitis Father    Cancer Maternal Grandmother    Stroke Maternal Grandmother    Heart disease Maternal Grandmother    Cancer Paternal Grandmother    Heart disease Paternal Grandfather    Stroke Paternal Grandfather     Social History Social History   Tobacco Use   Smoking status: Never   Smokeless tobacco:  Never  Vaping Use   Vaping Use: Never used  Substance Use Topics   Alcohol use: No   Drug use: No     Allergies   Amoxicillin, Latex, and Penicillins   Review of Systems Review of Systems  Constitutional:  Negative for chills and fever.  Gastrointestinal:  Positive for abdominal pain. Negative for constipation, diarrhea and vomiting.  Genitourinary:  Positive for dysuria and vaginal discharge. Negative for flank pain, hematuria and pelvic pain.  Musculoskeletal:  Positive for back pain.  Skin:  Negative for color change and rash.  All other systems reviewed and are negative.    Physical Exam Triage Vital Signs ED Triage Vitals  Enc Vitals Group     BP      Pulse      Resp      Temp      Temp src      SpO2      Weight      Height      Head Circumference      Peak  Flow      Pain Score      Pain Loc      Pain Edu?      Excl. in GC?    No data found.  Updated Vital Signs BP 110/76   Pulse 94   Temp 98.4 F (36.9 C)   Resp 18   Ht 5\' 2"  (1.575 m)   Wt 239 lb (108.4 kg)   LMP 02/11/2022   SpO2 97%   BMI 43.71 kg/m   Visual Acuity Right Eye Distance:   Left Eye Distance:   Bilateral Distance:    Right Eye Near:   Left Eye Near:    Bilateral Near:     Physical Exam Vitals and nursing note reviewed.  Constitutional:      General: She is not in acute distress.    Appearance: Normal appearance. She is well-developed. She is not ill-appearing.  HENT:     Mouth/Throat:     Mouth: Mucous membranes are moist.  Cardiovascular:     Rate and Rhythm: Normal rate and regular rhythm.     Heart sounds: Normal heart sounds.  Pulmonary:     Effort: Pulmonary effort is normal. No respiratory distress.     Breath sounds: Normal breath sounds.  Abdominal:     General: Bowel sounds are normal.     Palpations: Abdomen is soft.     Tenderness: There is no abdominal tenderness. There is no right CVA tenderness, left CVA tenderness, guarding or rebound.  Musculoskeletal:     Cervical back: Neck supple.  Skin:    General: Skin is warm and dry.  Neurological:     Mental Status: She is alert.  Psychiatric:        Mood and Affect: Mood normal.        Behavior: Behavior normal.      UC Treatments / Results  Labs (all labs ordered are listed, but only abnormal results are displayed) Labs Reviewed  POCT URINALYSIS DIP (MANUAL ENTRY) - Abnormal; Notable for the following components:      Result Value   Leukocytes, UA Trace (*)    All other components within normal limits  URINE CULTURE  RPR  HIV ANTIBODY (ROUTINE TESTING W REFLEX)  POCT URINE PREGNANCY  CERVICOVAGINAL ANCILLARY ONLY    EKG   Radiology No results found.  Procedures Procedures (including critical care time)  Medications Ordered in UC Medications - No data  to  display  Initial Impression / Assessment and Plan / UC Course  I have reviewed the triage vital signs and the nursing notes.  Pertinent labs & imaging results that were available during my care of the patient were reviewed by me and considered in my medical decision making (see chart for details).    STD testing, dysuria, lower abdominal pain, low back pain.  Urine culture pending; patient is currently on Bactrim.  Patient obtained vaginal self swab for testing.  HIV and RPR pending.  Discussed that we will call if test results are positive.  Discussed that she may require treatment at that time.  Discussed that sexual partner(s) may also require treatment.  Instructed patient to abstain from sexual activity for at least 7 days.  Instructed her to follow-up with her PCP.  ED precautions discussed for abdominal pain or other concerning symptoms.  Patient agrees to plan of care.   Final Clinical Impressions(s) / UC Diagnoses   Final diagnoses:  Screening for STD (sexually transmitted disease)  Dysuria  Lower abdominal pain  Acute right-sided low back pain without sciatica     Discharge Instructions      Go to the emergency department if you have worsening abdominal pain or other concerning symptoms.  The urine culture is pending.  Continue taking the Bactrim DS as directed.  Your STD tests are pending.  If your test results are positive, we will call you.  You and your sexual partner(s) may require treatment at that time.  Do not have sexual activity for at least 7 days.    Follow up with your primary care provider.        ED Prescriptions   None    PDMP not reviewed this encounter.   Mickie Bail, NP 03/12/22 [phone]

## 2022-03-12 NOTE — ED Triage Notes (Signed)
Patient to Urgent Care for STD testing. Reports that she is currently being treated with bactrim for a possible UTI, may also have been exposed to chlamydia. States that she was recently treated for BV but that her symptoms have not improved.   Reports constant burning sensation, pain on her right side and lower back. White vaginal discharge. Symptoms present for x1 week.

## 2022-03-13 LAB — CERVICOVAGINAL ANCILLARY ONLY
Bacterial Vaginitis (gardnerella): NEGATIVE
Candida Glabrata: NEGATIVE
Candida Vaginitis: NEGATIVE
Chlamydia: NEGATIVE
Comment: NEGATIVE
Comment: NEGATIVE
Comment: NEGATIVE
Comment: NEGATIVE
Comment: NEGATIVE
Comment: NORMAL
Neisseria Gonorrhea: NEGATIVE
Trichomonas: NEGATIVE

## 2022-03-13 LAB — URINE CULTURE: Culture: 20000 — AB

## 2022-03-14 LAB — RPR, QUANT+TP ABS (REFLEX)
Rapid Plasma Reagin, Quant: 1:2 {titer} — ABNORMAL HIGH
T Pallidum Abs: NONREACTIVE

## 2022-03-14 LAB — HIV ANTIBODY (ROUTINE TESTING W REFLEX): HIV Screen 4th Generation wRfx: NONREACTIVE

## 2022-03-14 LAB — RPR: RPR Ser Ql: REACTIVE — AB

## 2022-05-02 ENCOUNTER — Encounter: Payer: Self-pay | Admitting: Obstetrics and Gynecology

## 2022-05-04 ENCOUNTER — Ambulatory Visit: Payer: Self-pay

## 2022-05-05 ENCOUNTER — Inpatient Hospital Stay
Admission: RE | Admit: 2022-05-05 | Discharge: 2022-05-05 | Disposition: A | Discharge status: Home / Self Care | Payer: Medicaid Other | Source: Ambulatory Visit

## 2022-05-05 ENCOUNTER — Ambulatory Visit
Admission: EM | Admit: 2022-05-05 | Discharge: 2022-05-05 | Disposition: A | Discharge status: Home / Self Care | Payer: Medicaid Other | Attending: Urgent Care | Admitting: Urgent Care

## 2022-05-05 DIAGNOSIS — Z113 Encounter for screening for infections with a predominantly sexual mode of transmission: Secondary | ICD-10-CM | POA: Diagnosis not present

## 2022-05-05 DIAGNOSIS — M795 Residual foreign body in soft tissue: Secondary | ICD-10-CM | POA: Insufficient documentation

## 2022-05-05 DIAGNOSIS — N898 Other specified noninflammatory disorders of vagina: Secondary | ICD-10-CM | POA: Diagnosis not present
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
## 2022-05-05 NOTE — Discharge Instructions (Addendum)
Follow up here or with your primary care provider if your symptoms are worsening or not improving with treatment.     
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
## 2022-05-05 NOTE — ED Notes (Signed)
Unable to obtain blood work. Provider aware and notified

## 2022-05-06 LAB — CERVICOVAGINAL ANCILLARY ONLY
Bacterial Vaginitis (gardnerella): NEGATIVE
Candida Glabrata: NEGATIVE
Candida Vaginitis: POSITIVE — AB
Chlamydia: NEGATIVE
Comment: NEGATIVE
Comment: NEGATIVE
Comment: NEGATIVE
Comment: NEGATIVE
Comment: NEGATIVE
Comment: NORMAL
Neisseria Gonorrhea: NEGATIVE
Trichomonas: NEGATIVE

## 2022-05-07 ENCOUNTER — Telehealth: Payer: Medicaid Other | Admitting: Family Medicine

## 2022-05-07 DIAGNOSIS — B3731 Acute candidiasis of vulva and vagina: Secondary | ICD-10-CM

## 2022-05-07 MED ORDER — FLUCONAZOLE 150 MG PO TABS: 150.0000 mg | ORAL_TABLET | ORAL | 0 refills | Status: DC

## 2022-05-07 NOTE — Progress Notes (Signed)
Virtual Visit Consent   [name], you are scheduled for a virtual visit with a [facility] provider today. Just as with appointments in the office, your consent must be obtained to participate. Your consent will be active for this visit and any virtual visit you may have with one of our providers in the next 365 days. If you have a MyChart account, a copy of this consent can be sent to you electronically.  As this is a virtual visit, video technology does not allow for your provider to perform a traditional examination. This may limit your provider's ability to fully assess your condition. If your provider identifies any concerns that need to be evaluated in person or the need to arrange testing (such as labs, EKG, etc.), we will make arrangements to do so. Although advances in technology are sophisticated, we cannot ensure that it will always work on either your end or our end. If the connection with a video visit is poor, the visit may have to be switched to a telephone visit. With either a video or telephone visit, we are not always able to ensure that we have a secure connection.  By engaging in this virtual visit, you consent to the provision of healthcare and authorize for your insurance to be billed (if applicable) for the services provided during this visit. Depending on your insurance coverage, you may receive a charge related to this service.  I need to obtain your verbal consent now. Are you willing to proceed with your visit today? Mariah Richardson has provided verbal consent on 05/07/2022 for a virtual visit (video or telephone). Perlie Mayo, NP  Date: 05/07/2022 9:23 AM  Virtual Visit via Video Note   I, Perlie Mayo, connected with  Mariah Richardson  ([id number], [birth year]) on 05/07/22 at  9:30 AM EDT by a video-enabled telemedicine application and verified that I am speaking with the correct person using two identifiers.  Location: Patient: Virtual Visit Location  Patient: Home Provider: Virtual Visit Location Provider: Home Office   I discussed the limitations of evaluation and management by telemedicine and the availability of in person appointments. The patient expressed understanding and agreed to proceed.    History of Present Illness: Mariah Richardson is a 36 y.o. who identifies as a female who was assigned female at birth, and is being seen today for yeast infection that was found through STD testing at the local UC on 05/05/2022-she reports calling for treatment but has not had a response back, she is ok to proceed with this appt so you can get the help she needs. She denies others issues or symptoms outside of the vaginal itching and white creamy discharge. Denies abdominal pain, UTI symptoms or back pain, fevers, chills, n/v   Problems:  Patient Active Problem List   Diagnosis Date Noted   Sciatica of right side 01/29/2022   Non-reassuring electronic fetal monitoring tracing 06/11/2021   Labor and delivery, indication for care 05/24/2021   Indication for care in labor or delivery 05/14/2021   Back pain affecting pregnancy in third trimester 05/14/2021   Nausea 05/14/2021   Decreased fetal movements in third trimester    Syncopal episodes 05/03/2021   Pelvic pain in pregnancy, antepartum, third trimester 05/03/2021   [redacted] weeks gestation of pregnancy    Supervision of high risk pregnancy, antepartum 11/06/2020   Obesity affecting pregnancy in third trimester 11/06/2020   Acute vaginitis 06/09/2020   Chronic migraine 07/30/2019   Weight loss  counseling, encounter for 02/02/2019   History of marijuana use 06/30/2017   Biological false positive RPR test 02/28/2017   Anxiety 10/03/2015    Allergies:  Allergies  Allergen Reactions   Amoxicillin    Latex    Penicillins    Medications:  Current Outpatient Medications:    fluconazole (DIFLUCAN) 150 MG tablet, Take 150 mg by mouth every 3 (three) days., Disp: , Rfl:    ibuprofen (ADVIL)  600 MG tablet, Take 1 tablet (600 mg total) by mouth every 8 (eight) hours as needed., Disp: 60 tablet, Rfl: 1   metoCLOPramide (REGLAN) 10 MG tablet, Take 1 tablet (10 mg total) by mouth 4 (four) times daily for 14 days., Disp: 56 tablet, Rfl: 0   metroNIDAZOLE (FLAGYL) 500 MG tablet, Take 500 mg by mouth 2 (two) times daily., Disp: , Rfl:    ondansetron (ZOFRAN) 8 MG tablet, Take 8 mg by mouth every 8 (eight) hours as needed., Disp: , Rfl:    sulfamethoxazole-trimethoprim (BACTRIM DS) 800-160 MG tablet, Take 1 tablet by mouth 2 (two) times daily., Disp: 10 tablet, Rfl: 0  Observations/Objective: Patient is well-developed, well-nourished in no acute distress.  Resting comfortably  at home.  Head is normocephalic, atraumatic.  No labored breathing.  Speech is clear and coherent with logical content.  Patient is alert and oriented at baseline.    Assessment and Plan:  1. Yeast vaginitis  - fluconazole (DIFLUCAN) 150 MG tablet; Take 1 tablet (150 mg total) by mouth every 3 (three) days.  Dispense: 2 tablet; Refill: 0   -results noted in Epic, pt tried to call UC back to get treatment without response, is ok to have another visit for treatment needs. -discussed boric acid supp as well for on going needs    Reviewed side effects, risks and benefits of medication.   Patient acknowledged agreement and understanding of the plan.  Past Medical, Surgical, Social History, Allergies, and Medications have been Reviewed.   Follow Up Instructions: I discussed the assessment and treatment plan with the patient. The patient was provided an opportunity to ask questions and all were answered. The patient agreed with the plan and demonstrated an understanding of the instructions.  A copy of instructions were sent to the patient via MyChart unless otherwise noted below.     The patient was advised to call back or seek an in-person evaluation if the symptoms worsen or if the condition fails to improve  as anticipated.  Time:  I spent 8 minutes with the patient via telehealth technology discussing the above problems/concerns.    Perlie Mayo, NP

## 2022-05-07 NOTE — Patient Instructions (Signed)
Mariah Richardson, thank you for joining Perlie Mayo, NP for today's virtual visit.  While this provider is not your primary care provider (PCP), if your PCP is located in our provider database this encounter information will be shared with them immediately following your visit.   [facility] account gives you access to today's visit and all your visits, tests, and labs performed at [facility] " click here if you don't have a [facility] account or go to [email or web address]  Consent: (Patient) Mariah Richardson provided verbal consent for this virtual visit at the beginning of the encounter.  Current Medications:  Current Outpatient Medications:    fluconazole (DIFLUCAN) 150 MG tablet, Take 1 tablet (150 mg total) by mouth every 3 (three) days., Disp: 2 tablet, Rfl: 0   ibuprofen (ADVIL) 600 MG tablet, Take 1 tablet (600 mg total) by mouth every 8 (eight) hours as needed., Disp: 60 tablet, Rfl: 1   metoCLOPramide (REGLAN) 10 MG tablet, Take 1 tablet (10 mg total) by mouth 4 (four) times daily for 14 days., Disp: 56 tablet, Rfl: 0   metroNIDAZOLE (FLAGYL) 500 MG tablet, Take 500 mg by mouth 2 (two) times daily., Disp: , Rfl:    ondansetron (ZOFRAN) 8 MG tablet, Take 8 mg by mouth every 8 (eight) hours as needed., Disp: , Rfl:    sulfamethoxazole-trimethoprim (BACTRIM DS) 800-160 MG tablet, Take 1 tablet by mouth 2 (two) times daily., Disp: 10 tablet, Rfl: 0   Medications ordered in this encounter:  Meds ordered this encounter  Medications   fluconazole (DIFLUCAN) 150 MG tablet    Sig: Take 1 tablet (150 mg total) by mouth every 3 (three) days.    Dispense:  2 tablet    Refill:  0    Order Specific Question:   Supervising Provider    Answer:   Chase Picket [id number]     *If you need refills on other medications prior to your next appointment, please contact your pharmacy*  Follow-Up: Call back or seek an in-person evaluation if the symptoms  worsen or if the condition fails to improve as anticipated.  [place] [phone]  Other Instructions Boric Acid Vaginal Suppositories What is this medication? BORIC ACID (BOHR ik AS id) may support vaginal health. It may relieve the symptoms of a yeast infection, such as itching, burning, and odor. This medicine may be used for other purposes; ask your health care provider or pharmacist if you have questions. COMMON BRAND NAME(S): AZO Boric Acid with Aloe Vera, Hylafem What should I tell my care team before I take this medication? They need to know if you have any of these conditions: Diabetes Frequent infections HIV or AIDS Immune system problems An unusual or allergic reaction to boric acid, other medications, foods, dyes, or preservatives Pregnant or trying to get pregnant Breast-feeding How should I use this medication? This medication is for use in the vagina. Do not take by mouth. Follow the directions on the prescription label. Read package directions carefully before using. Wash hands before and after use. Use this medication at bedtime, unless otherwise directed by your care team. Do not use your medication more often than directed. Do not stop using this medication except on your care team's advice. Talk to your care team about the use of this medication in children. This medication is not approved for use in children. Overdosage: If you think you have taken too much of this medicine contact a  poison control center or emergency room at once. NOTE: This medicine is only for you. Do not share this medicine with others. What if I miss a dose? If you miss a dose, use it as soon as you can. If it is almost time for your next dose, use only that dose. Do not use double or extra doses. What may interact with this medication? Interactions are not expected. Do not use any other vaginal products without telling your care team. This list may not describe all possible  interactions. Give your health care provider a list of all the medicines, herbs, non-prescription drugs, or dietary supplements you use. Also tell them if you smoke, drink alcohol, or use illegal drugs. Some items may interact with your medicine. What should I watch for while using this medication? Tell your care team if your symptoms do not start to get better within a few days. It is better not to have sex until you have finished your treatment. This medication may cause condoms, diaphragms, and spermicides to not work as well. Do not rely on any of these methods to prevent sexually transmitted infections (STIs) or pregnancy while you are using this medication. Vaginal medications may come out of the vagina during treatment. To keep the medication from getting on your clothing, wear a panty liner. The use of tampons is not recommended. To help clear up the infection, wear freshly washed cotton, not synthetic, underwear. What side effects may I notice from receiving this medication? Side effects that you should report to your care team as soon as possible: Allergic reactions--skin rash, itching, hives, swelling of the face, lips, tongue, or throat Unusual vaginal discharge, itching, or odor Side effects that usually do not require medical attention (report to your care team if they continue or are bothersome): Vaginal irritation at the application site This list may not describe all possible side effects. Call your doctor for medical advice about side effects. You may report side effects to FDA at 1-800-FDA-1088. Where should I keep my medication? Keep out of the reach of children and pets. Store in a cool, dry place between 15 and 30 degrees C (59 and 86 degrees F). Keep away from sunlight. Throw away any unused medication after the expiration date. NOTE: This sheet is a summary. It may not cover all possible information. If you have questions about this medicine, talk to your doctor, pharmacist, or  health care provider.  2023 Elsevier/Gold Standard (2021-05-01 00:00:00)   If you have been instructed to have an in-person evaluation today at a local Urgent Care facility, please use the link below. It will take you to a list of all of our available [facility], including address, phone number and hours of operation. Please do not delay care.  [facility]  If you or a family member do not have a primary care provider, use the link below to schedule a visit and establish care. When you choose a [facility] primary care physician or advanced practice provider, you gain a long-term partner in health. Find a Primary Care Provider  Learn more about [facility]'s in-office and virtual care options:  - Get Care Now

## 2022-06-12 IMAGING — CR DG CHEST 2V
1 series · 2 of 2 positions shown · non-contrast
Comparison: 02/27/2016

CLINICAL DATA: Chest pain

EXAM:
CHEST - 2 VIEW

[Series 1: dg chest 2 view · 0.14mm/px · 2 of 2 slices shown]
[im 1/2]
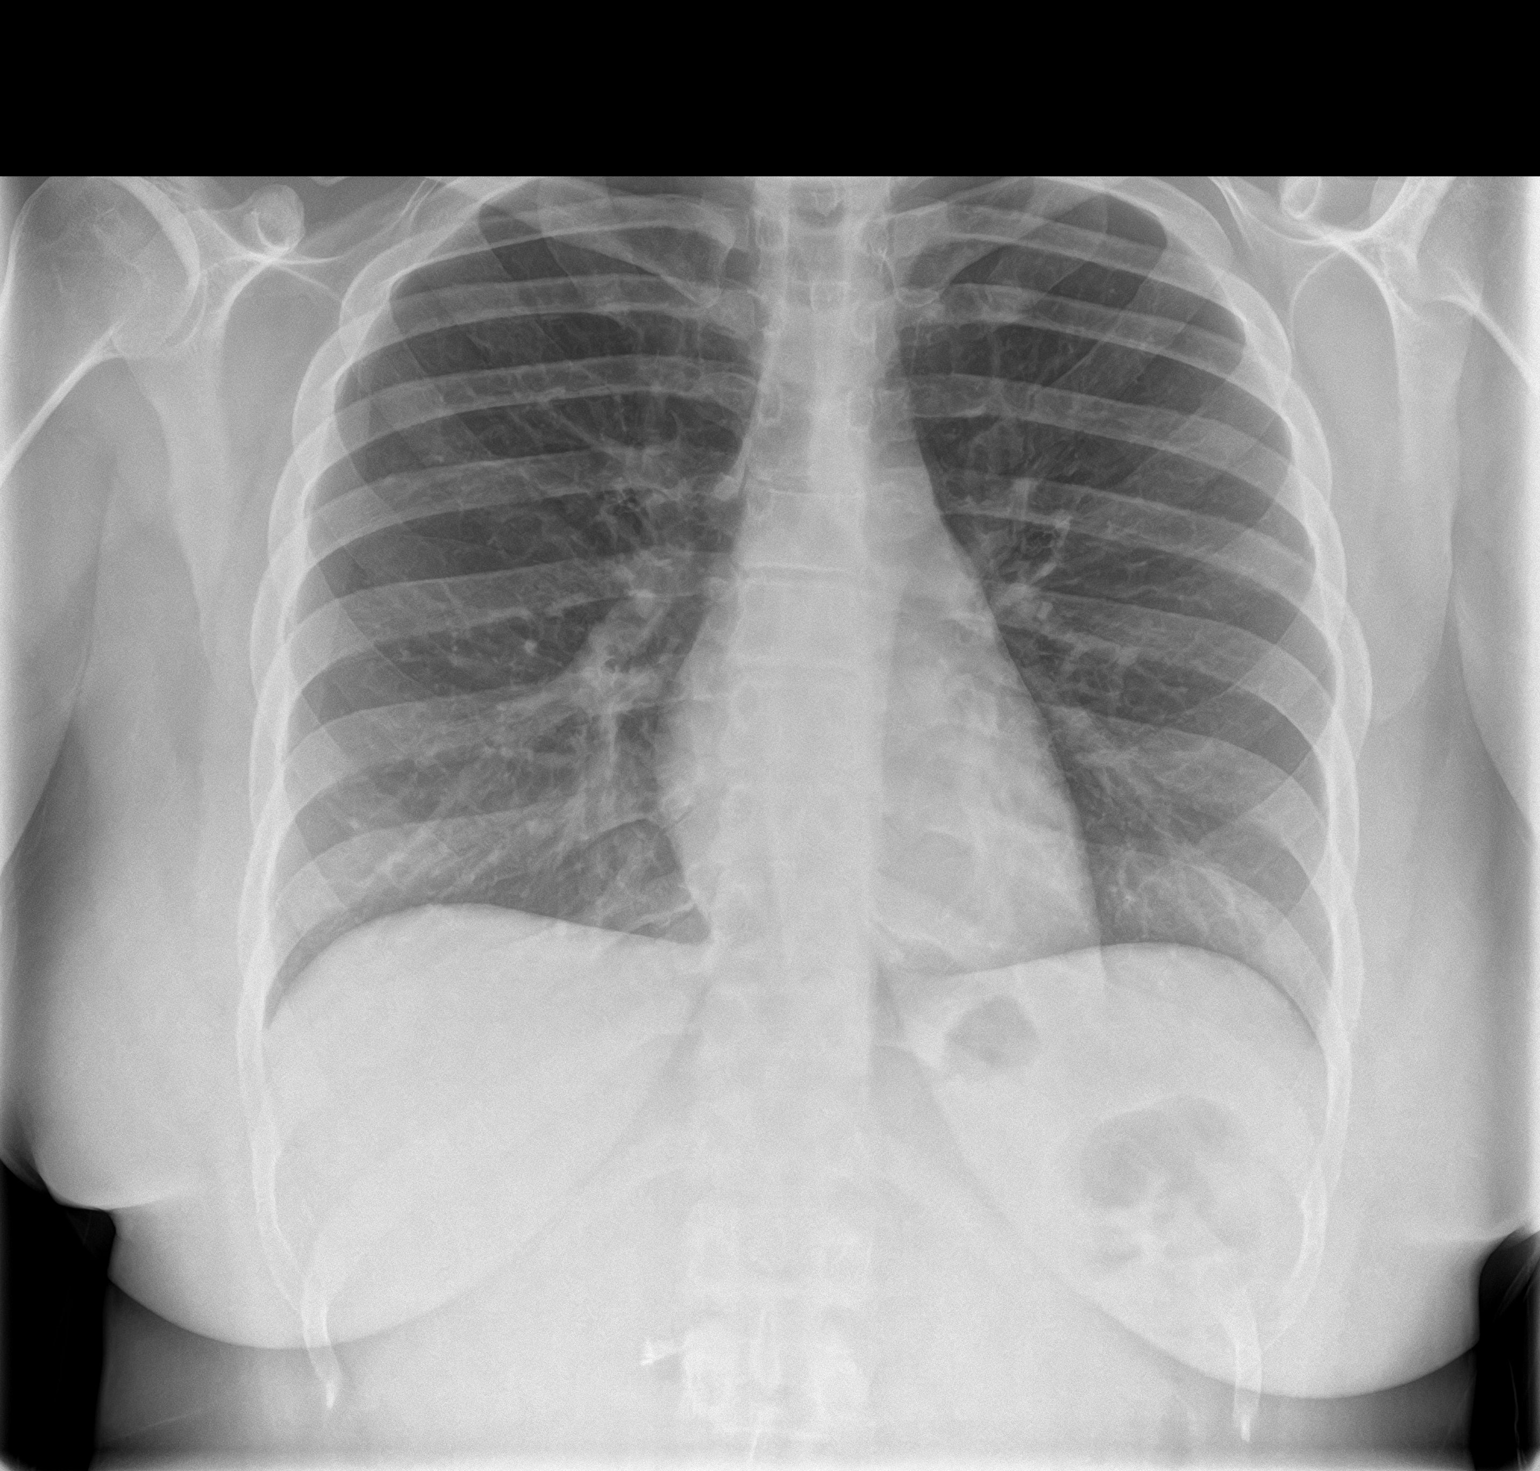
[im 2/2]
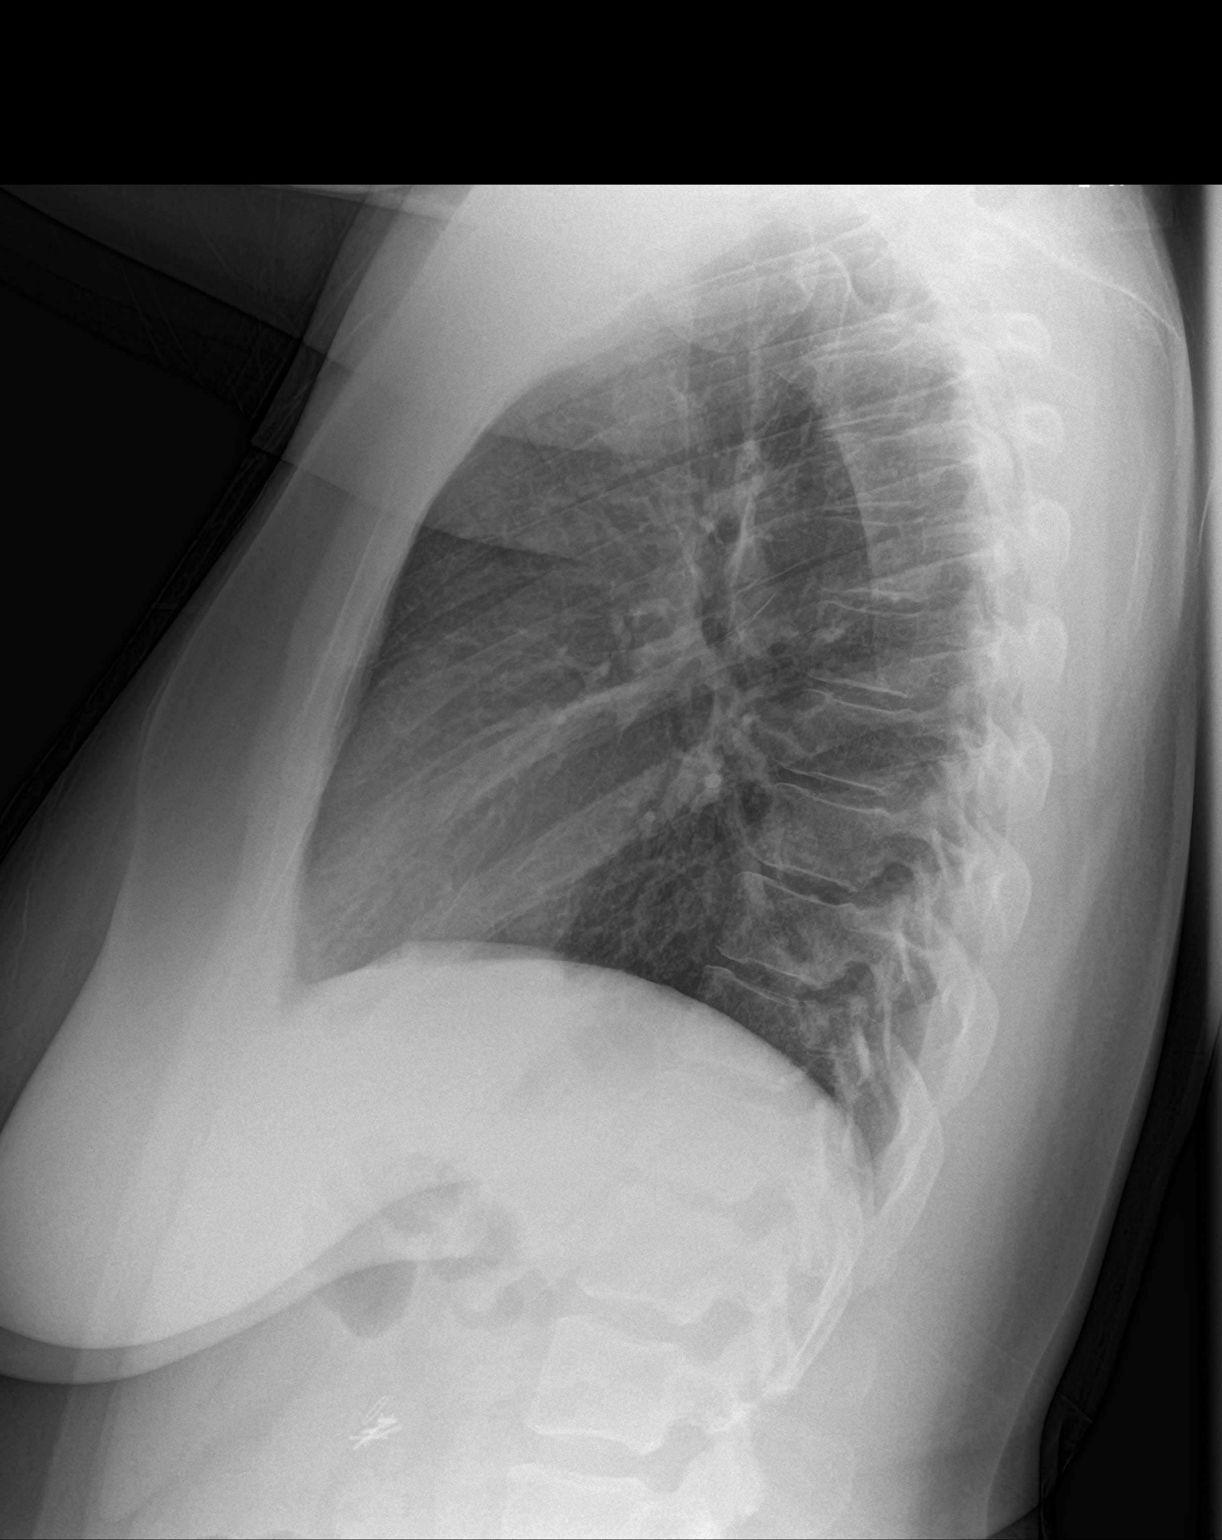

[2 of 2 positions shown; findings below may reference images not displayed]

FINDINGS: The heart size and mediastinal contours are within normal limits.
Both lungs are clear. The visualized skeletal structures are
unremarkable.
IMPRESSION: No acute abnormality of the lungs.

## 2022-07-09 IMAGING — CR DG CHEST 2V
1 series · 2 of 2 positions shown · non-contrast
Comparison: Radiograph 06/11/2020

CLINICAL DATA: Generalized body aches, chills for 2 days, nasal
congestion, cough

EXAM:
CHEST - 2 VIEW

[Series 1: dg chest 2 view · 0.14mm/px · 2 of 2 slices shown]
[im 1/2]
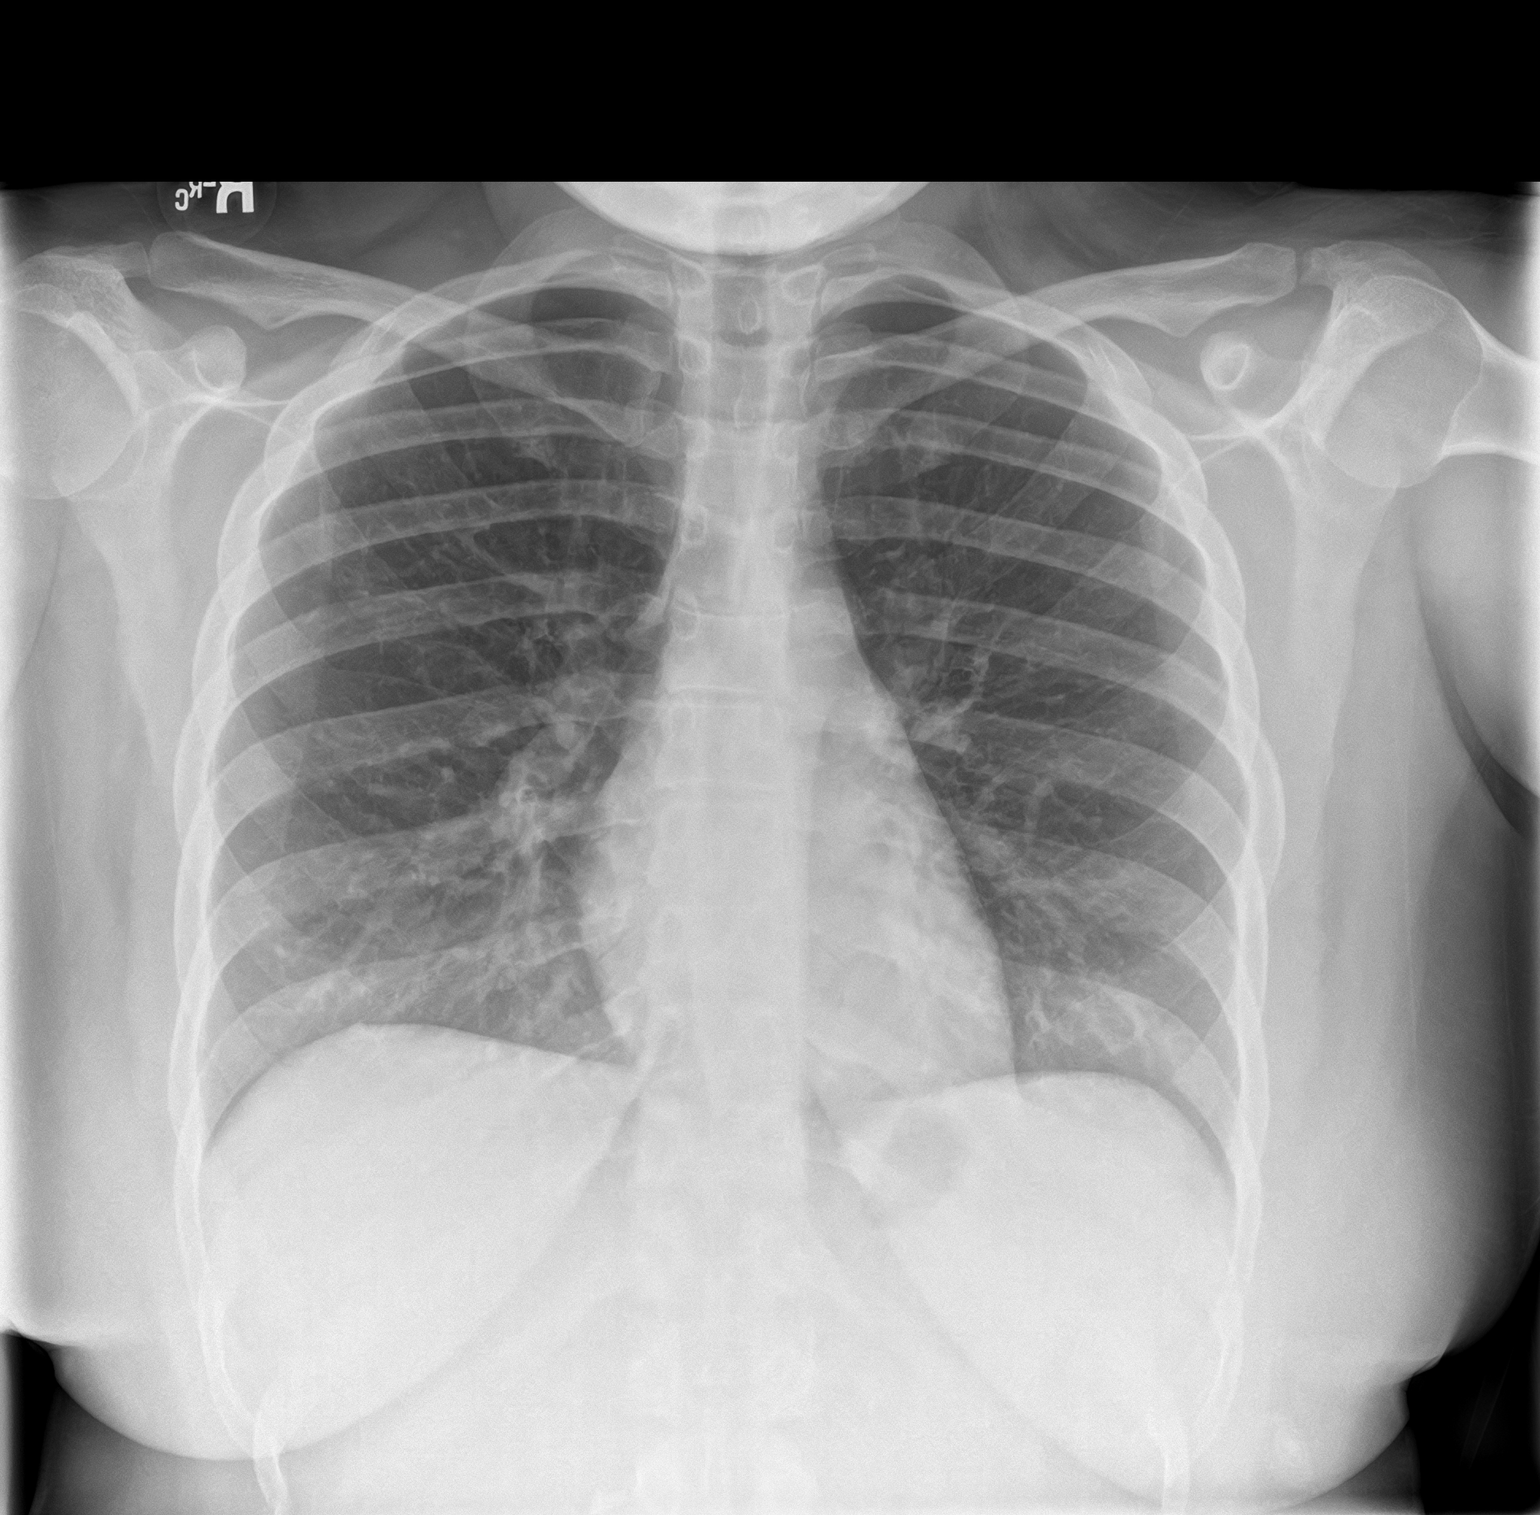
[im 2/2]
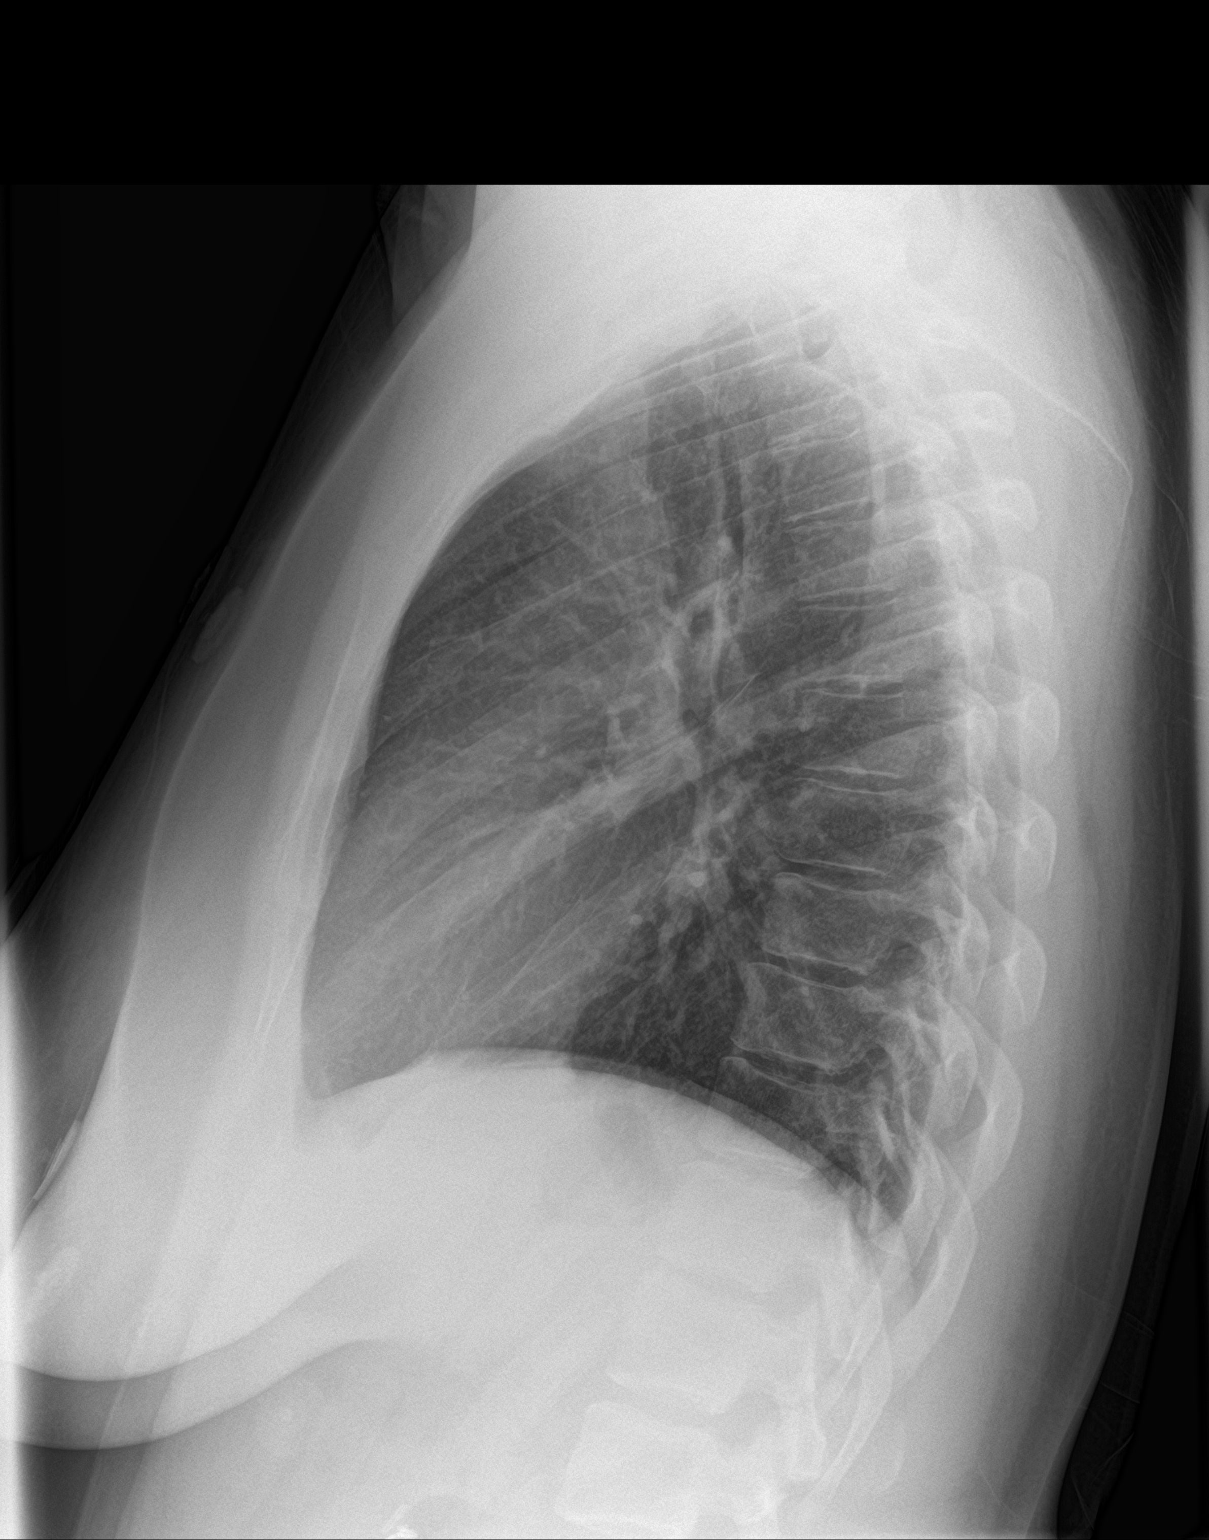

[2 of 2 positions shown; findings below may reference images not displayed]

FINDINGS: Accounting for low volumes and body habitus, the lungs are clear. No
consolidation, features of edema, pneumothorax, or effusion.
Pulmonary vascularity is normally distributed. The cardiomediastinal
contours are unremarkable. No acute osseous or soft tissue
abnormality.
IMPRESSION: No acute cardiopulmonary abnormality.

## 2022-07-23 ENCOUNTER — Telehealth: Payer: Medicaid Other | Admitting: Physician Assistant

## 2022-07-23 DIAGNOSIS — N760 Acute vaginitis: Secondary | ICD-10-CM | POA: Diagnosis not present

## 2022-07-23 MED ORDER — FLUCONAZOLE 150 MG PO TABS: 150.0000 mg | ORAL_TABLET | ORAL | 0 refills | Status: DC

## 2022-07-23 MED ORDER — METRONIDAZOLE 500 MG PO TABS: 500.0000 mg | ORAL_TABLET | Freq: Two times a day (BID) | ORAL | 0 refills | Status: DC

## 2022-07-23 NOTE — Patient Instructions (Signed)
  Mariah Richardson, thank you for joining [name], PA-C for today's virtual visit.  While this provider is not your primary care provider (PCP), if your PCP is located in our provider database this encounter information will be shared with them immediately following your visit.   [facility] account gives you access to today's visit and all your visits, tests, and labs performed at [facility] " click here if you don't have a [facility] account or go to [email or web address]  Consent: (Patient) Mariah Richardson provided verbal consent for this virtual visit at the beginning of the encounter.  Current Medications:  Current Outpatient Medications:    fluconazole (DIFLUCAN) 150 MG tablet, Take 1 tablet (150 mg total) by mouth every 3 (three) days., Disp: 2 tablet, Rfl: 0   ibuprofen (ADVIL) 600 MG tablet, Take 1 tablet (600 mg total) by mouth every 8 (eight) hours as needed., Disp: 60 tablet, Rfl: 1   metoCLOPramide (REGLAN) 10 MG tablet, Take 1 tablet (10 mg total) by mouth 4 (four) times daily for 14 days., Disp: 56 tablet, Rfl: 0   metroNIDAZOLE (FLAGYL) 500 MG tablet, Take 500 mg by mouth 2 (two) times daily., Disp: , Rfl:    ondansetron (ZOFRAN) 8 MG tablet, Take 8 mg by mouth every 8 (eight) hours as needed., Disp: , Rfl:    sulfamethoxazole-trimethoprim (BACTRIM DS) 800-160 MG tablet, Take 1 tablet by mouth 2 (two) times daily., Disp: 10 tablet, Rfl: 0   Medications ordered in this encounter:  No orders of the defined types were placed in this encounter.    *If you need refills on other medications prior to your next appointment, please contact your pharmacy*  Follow-Up: Call back or seek an in-person evaluation if the symptoms worsen or if the condition fails to improve as anticipated.  [facility] [phone]  Other Instructions Please take medications as directed. If symptoms or not resolving or you note any new or  worsening symptoms despite treatment, please seek an in-person evaluation.   If you have been instructed to have an in-person evaluation today at a local Urgent Care facility, please use the link below. It will take you to a list of all of our available [facility], including address, phone number and hours of operation. Please do not delay care.  [facility]  If you or a family member do not have a primary care provider, use the link below to schedule a visit and establish care. When you choose a [facility] primary care physician or advanced practice provider, you gain a long-term partner in health. Find a Primary Care Provider  Learn more about 's in-office and virtual care options: [facility]

## 2022-07-23 NOTE — Progress Notes (Signed)
Virtual Visit Consent   [name], you are scheduled for a virtual visit with a [facility] provider today. Just as with appointments in the office, your consent must be obtained to participate. Your consent will be active for this visit and any virtual visit you may have with one of our providers in the next 365 days. If you have a MyChart account, a copy of this consent can be sent to you electronically.  As this is a virtual visit, video technology does not allow for your provider to perform a traditional examination. This may limit your provider's ability to fully assess your condition. If your provider identifies any concerns that need to be evaluated in person or the need to arrange testing (such as labs, EKG, etc.), we will make arrangements to do so. Although advances in technology are sophisticated, we cannot ensure that it will always work on either your end or our end. If the connection with a video visit is poor, the visit may have to be switched to a telephone visit. With either a video or telephone visit, we are not always able to ensure that we have a secure connection.  By engaging in this virtual visit, you consent to the provision of healthcare and authorize for your insurance to be billed (if applicable) for the services provided during this visit. Depending on your insurance coverage, you may receive a charge related to this service.  I need to obtain your verbal consent now. Are you willing to proceed with your visit today? Mariah Richardson has provided verbal consent on 07/23/2022 for a virtual visit (video or telephone). Mariah Richardson, [place]  Date: 07/23/2022 6:36 PM  Virtual Visit via Video Note   I, Mariah Richardson, connected with  KASY IANNACONE  ([id number], 11/17/1985) on 07/23/22 at  6:15 PM EST by a video-enabled telemedicine application and verified that I am speaking with the correct person using two identifiers.  Location: Patient: Virtual Visit  Location Patient: Home Provider: Virtual Visit Location Provider: Home Office   I discussed the limitations of evaluation and management by telemedicine and the availability of in person appointments. The patient expressed understanding and agreed to proceed.    History of Present Illness: Mariah Richardson is a 37 y.o. who identifies as a female who was assigned female at birth, and is being seen today for possible yeast/bv.  Notes that she has a history of getting BV occasionally after her menstrual period.  Oftentimes with treatment this leads to a yeast infection.  Notes her last menstrual period ended on 1224.  Since then has been noticing some vaginal itching and foul-smelling discharge.  Is now also noticing a change in discharge that is reminiscent of prior yeast infections.  Is not sexually active so denies concern for STI. HPI: HPI  Problems:  Patient Active Problem List   Diagnosis Date Noted   Sciatica of right side 01/29/2022   Non-reassuring electronic fetal monitoring tracing 06/11/2021   Labor and delivery, indication for care 05/24/2021   Indication for care in labor or delivery 05/14/2021   Back pain affecting pregnancy in third trimester 05/14/2021   Nausea 05/14/2021   Decreased fetal movements in third trimester    Syncopal episodes 05/03/2021   Pelvic pain in pregnancy, antepartum, third trimester 05/03/2021   [redacted] weeks gestation of pregnancy    Supervision of high risk pregnancy, antepartum 11/06/2020   Obesity affecting pregnancy in third trimester 11/06/2020   Acute vaginitis 06/09/2020  Chronic migraine 07/30/2019   Weight loss counseling, encounter for 02/02/2019   History of marijuana use 06/30/2017   Biological false positive RPR test 02/28/2017   Anxiety 10/03/2015    Allergies:  Allergies  Allergen Reactions   Amoxicillin    Latex    Penicillins    Medications: No current outpatient medications on file.  Observations/Objective: Patient is  well-developed, well-nourished in no acute distress.  Resting comfortably at home.  Head is normocephalic, atraumatic.  No labored breathing. Speech is clear and coherent with logical content.  Patient is alert and oriented at baseline.   Assessment and Plan: 1. Acute vaginitis  Suspect combination of yeast and BV.  Will start empiric treatment with Flagyl and Diflucan.  If not fully resolving or anything new or worsening despite treatment, she will require an in person evaluation.  Patient is aware of this.  Follow Up Instructions: I discussed the assessment and treatment plan with the patient. The patient was provided an opportunity to ask questions and all were answered. The patient agreed with the plan and demonstrated an understanding of the instructions.  A copy of instructions were sent to the patient via MyChart unless otherwise noted below.   The patient was advised to call back or seek an in-person evaluation if the symptoms worsen or if the condition fails to improve as anticipated.  Time:  I spent 10 minutes with the patient via telehealth technology discussing the above problems/concerns.    Mariah Rio, PA-C

## 2022-07-29 ENCOUNTER — Ambulatory Visit: Admit: 2022-07-29 | Discharge status: Against Medical Advice | Payer: Medicaid Other

## 2022-07-30 ENCOUNTER — Ambulatory Visit
Admission: RE | Admit: 2022-07-30 | Discharge: 2022-07-30 | Disposition: A | Discharge status: Home / Self Care | Payer: Medicaid Other | Source: Ambulatory Visit | Attending: Emergency Medicine | Admitting: Emergency Medicine

## 2022-07-30 VITALS — BP 123/80 | HR 96 | Temp 98.8°F | Resp 17 | Ht 61.0 in | Wt 235.0 lb

## 2022-07-30 DIAGNOSIS — Z113 Encounter for screening for infections with a predominantly sexual mode of transmission: Secondary | ICD-10-CM | POA: Diagnosis not present

## 2022-07-30 DIAGNOSIS — N898 Other specified noninflammatory disorders of vagina: Secondary | ICD-10-CM

## 2022-07-30 LAB — POCT URINE PREGNANCY: Preg Test, Ur: NEGATIVE

## 2022-07-30 LAB — POCT URINALYSIS DIP (MANUAL ENTRY)
Bilirubin, UA: NEGATIVE
Blood, UA: NEGATIVE
Glucose, UA: NEGATIVE mg/dL
Ketones, POC UA: NEGATIVE mg/dL
Leukocytes, UA: NEGATIVE
Nitrite, UA: NEGATIVE
Protein Ur, POC: NEGATIVE mg/dL
Spec Grav, UA: 1.02 (ref 1.010–1.025)
Urobilinogen, UA: 0.2 E.U./dL
pH, UA: 6 (ref 5.0–8.0)

## 2022-07-30 NOTE — ED Provider Notes (Signed)
[name]    CSN: [id number] Arrival date & time: 07/30/22  0857      History   Chief Complaint Chief Complaint  Patient presents with   SEXUALLY TRANSMITTED DISEASE    Full panel check up - Entered by patient    HPI Mariah Richardson is a 37 y.o. female.  Patient presents with 2 week history of vaginal discharge.  She reports history of recurrent bacterial vaginitis and yeast vaginitis.  She requests STD testing, including HIV test.  She denies fever, rash, abdominal pain, dysuria, pelvic pain, or other symptoms.  Her medical history includes biological false positive RPR test.  Patient had e-visit on 07/23/2022; diagnosed with vaginitis; treated with Diflucan.     The history is provided by the patient and medical records.    Past Medical History:  Diagnosis Date   Gastritis    Hypertension     Patient Active Problem List   Diagnosis Date Noted   Sciatica of right side 01/29/2022   Non-reassuring electronic fetal monitoring tracing 06/11/2021   Labor and delivery, indication for care 05/24/2021   Indication for care in labor or delivery 05/14/2021   Back pain affecting pregnancy in third trimester 05/14/2021   Nausea 05/14/2021   Decreased fetal movements in third trimester    Syncopal episodes 05/03/2021   Pelvic pain in pregnancy, antepartum, third trimester 05/03/2021   [redacted] weeks gestation of pregnancy    Supervision of high risk pregnancy, antepartum 11/06/2020   Obesity affecting pregnancy in third trimester 11/06/2020   Acute vaginitis 06/09/2020   Chronic migraine 07/30/2019   Weight loss counseling, encounter for 02/02/2019   History of marijuana use 06/30/2017   Biological false positive RPR test 02/28/2017   Anxiety 10/03/2015    Past Surgical History:  Procedure Laterality Date   APPENDECTOMY     CHOLECYSTECTOMY      OB History     Gravida  5   Para  4   Term  4   Preterm      AB  1   Living  4      SAB      IAB       Ectopic      Multiple  0   Live Births  4            Home Medications    Prior to Admission medications   Medication Sig Start Date End Date Taking? Authorizing Provider  fluconazole (DIFLUCAN) 150 MG tablet Take 1 tablet (150 mg total) by mouth every 3 (three) days. 07/23/22   Waldon Merl, PA-C  metroNIDAZOLE (FLAGYL) 500 MG tablet Take 1 tablet (500 mg total) by mouth 2 (two) times daily. 07/23/22   Waldon Merl, PA-C    Family History Family History  Problem Relation Age of Onset   Diabetes Mother    Migraines Father    Diverticulitis Father    Cancer Maternal Grandmother    Stroke Maternal Grandmother    Heart disease Maternal Grandmother    Cancer Paternal Grandmother    Heart disease Paternal Grandfather    Stroke Paternal Grandfather     Social History Social History   Tobacco Use   Smoking status: Never   Smokeless tobacco: Never  Vaping Use   Vaping Use: Never used  Substance Use Topics   Alcohol use: No   Drug use: No     Allergies   Amoxicillin, Latex, and Penicillins   Review of Systems Review  of Systems  Constitutional:  Negative for chills and fever.  Gastrointestinal:  Negative for abdominal pain, constipation, diarrhea, nausea and vomiting.  Genitourinary:  Positive for vaginal discharge. Negative for dysuria, flank pain, hematuria and pelvic pain.  Skin:  Negative for color change and rash.  All other systems reviewed and are negative.    Physical Exam Triage Vital Signs ED Triage Vitals  Enc Vitals Group     BP      Pulse      Resp      Temp      Temp src      SpO2      Weight      Height      Head Circumference      Peak Flow      Pain Score      Pain Loc      Pain Edu?      Excl. in [place]?    No data found.  Updated Vital Signs BP 123/80   Pulse 96   Temp 98.8 F (37.1 C)   Resp 17   Ht 5\' 1"  (1.549 m)   Wt 235 lb (106.6 kg)   LMP 07/13/2022   SpO2 98%   BMI 44.40 kg/m   Visual Acuity Right Eye  Distance:   Left Eye Distance:   Bilateral Distance:    Right Eye Near:   Left Eye Near:    Bilateral Near:     Physical Exam Vitals and nursing note reviewed.  Constitutional:      General: She is not in acute distress.    Appearance: She is well-developed. She is not ill-appearing.  HENT:     Mouth/Throat:     Mouth: Mucous membranes are moist.  Cardiovascular:     Rate and Rhythm: Normal rate and regular rhythm.     Heart sounds: Normal heart sounds.  Pulmonary:     Effort: Pulmonary effort is normal. No respiratory distress.     Breath sounds: Normal breath sounds.  Abdominal:     General: Bowel sounds are normal.     Palpations: Abdomen is soft.     Tenderness: There is no abdominal tenderness. There is no right CVA tenderness, left CVA tenderness, guarding or rebound.  Musculoskeletal:     Cervical back: Neck supple.  Skin:    General: Skin is warm and dry.  Neurological:     Mental Status: She is alert.  Psychiatric:        Mood and Affect: Mood normal.        Behavior: Behavior normal.      UC Treatments / Results  Labs (all labs ordered are listed, but only abnormal results are displayed) Labs Reviewed  HIV ANTIBODY (ROUTINE TESTING W REFLEX)  RPR  POCT URINALYSIS DIP (MANUAL ENTRY)  POCT URINE PREGNANCY  CERVICOVAGINAL ANCILLARY ONLY    EKG   Radiology No results found.  Procedures Procedures (including critical care time)  Medications Ordered in UC Medications - No data to display  Initial Impression / Assessment and Plan / UC Course  I have reviewed the triage vital signs and the nursing notes.  Pertinent labs & imaging results that were available during my care of the patient were reviewed by me and considered in my medical decision making (see chart for details).    STD screening, vaginal discharge.  Patient obtained vaginal self swab for testing.  HIV and RPR pending per patient request.  Discussed that we will call if  test results  are positive.  Discussed that she may require treatment at that time.  Discussed that sexual partner(s) may also require treatment.  Instructed patient to abstain from sexual activity for at least 7 days.  Instructed her to follow-up with her PCP or gynecologist if her symptoms are not improving.  Patient agrees to plan of care.   Final Clinical Impressions(s) / UC Diagnoses   Final diagnoses:  Screening for STD (sexually transmitted disease)  Vaginal discharge     Discharge Instructions      Your tests are pending.  If your test results are positive, we will call you.  You and your sexual partner(s) may require treatment at that time.  Do not have sexual activity for at least 7 days.    Follow up with your primary care provider if your symptoms are not improving.        ED Prescriptions   None    PDMP not reviewed this encounter.   Sharion Balloon, NP 07/30/22 [phone]

## 2022-07-30 NOTE — ED Triage Notes (Addendum)
Patient to Urgent Care with complaints of vaginal discharge. Reports recurrent yeast and BV, treated via e-visit w/ diflucan and flagyl but symptoms did not resolve. Symptoms started two weeks ago.  Requests STD testing via swab and blood work.

## 2022-07-30 NOTE — Discharge Instructions (Signed)
Your tests are pending.  If your test results are positive, we will call you.  You and your sexual partner(s) may require treatment at that time.  Do not have sexual activity for at least 7 days.    Follow up with your primary care provider if your symptoms are not improving.    

## 2022-07-31 LAB — CERVICOVAGINAL ANCILLARY ONLY
Bacterial Vaginitis (gardnerella): NEGATIVE
Candida Glabrata: NEGATIVE
Candida Vaginitis: NEGATIVE
Chlamydia: NEGATIVE
Comment: NEGATIVE
Comment: NEGATIVE
Comment: NEGATIVE
Comment: NEGATIVE
Comment: NEGATIVE
Comment: NORMAL
Neisseria Gonorrhea: NEGATIVE
Trichomonas: NEGATIVE

## 2022-08-01 LAB — RPR: RPR Ser Ql: REACTIVE — AB

## 2022-08-01 LAB — RPR, QUANT+TP ABS (REFLEX)
Rapid Plasma Reagin, Quant: 1:2 {titer} — ABNORMAL HIGH
T Pallidum Abs: NONREACTIVE

## 2022-08-01 LAB — HIV ANTIBODY (ROUTINE TESTING W REFLEX): HIV Screen 4th Generation wRfx: NONREACTIVE

## 2022-08-19 NOTE — Progress Notes (Unsigned)
    GYNECOLOGY PROGRESS NOTE  Subjective:    Patient ID: Mariah Richardson, female    DOB: 10-15-1985, 37 y.o.   MRN: [id number]  HPI  Patient is a 37 y.o. [id number] female who presents for screening for STD's. She currently does not have any symptoms. She denies discharge, itching, or irritation. She would like to be screened to ensure she does not have any STDs.  Notes that she has been on and off again with intimacy with current partner for the past several years.  Last intercourse was Sunday.   The following portions of the patient's history were reviewed and updated as appropriate: allergies, current medications, past family history, past medical history, past social history, past surgical history, and problem list.  Review of Systems Pertinent items are noted in HPI.   Objective:   Blood pressure 116/62, pulse 92, height 5\' 2"  (1.575 m), weight 238 lb 4.8 oz (108.1 kg), last menstrual period 07/13/2022, not currently breastfeeding.  Body mass index is 43.59 kg/m. General appearance: alert, cooperative, and no distress Pelvic: patient allowed to perform self-swab.   Assessment:   1. Screen for STD (sexually transmitted disease)     Plan:   Encouraged safe sex practices.  STD screening performed today, including HSV and HIV.  Patient overdue for routine preventative maintenance, will schedule for annual exam in the next 2-3 months.  Pap smear was abnormal, ASCUS HR HPV+ on 07/23/2021, had colposcopy on 08/31/21 with normal ECC. Due for repeat this year .    Rubie Maid, MD [facility]

## 2022-08-21 ENCOUNTER — Ambulatory Visit (INDEPENDENT_AMBULATORY_CARE_PROVIDER_SITE_OTHER): Payer: Medicaid Other | Admitting: Obstetrics and Gynecology

## 2022-08-21 ENCOUNTER — Encounter: Payer: Self-pay | Admitting: Obstetrics and Gynecology

## 2022-08-21 VITALS — BP 116/62 | HR 92 | Ht 62.0 in | Wt 238.3 lb

## 2022-08-21 DIAGNOSIS — Z113 Encounter for screening for infections with a predominantly sexual mode of transmission: Secondary | ICD-10-CM

## 2022-08-23 ENCOUNTER — Encounter: Payer: Self-pay | Admitting: Obstetrics and Gynecology

## 2022-08-23 LAB — HEPATITIS B SURFACE ANTIGEN: Hepatitis B Surface Ag: NEGATIVE

## 2022-08-23 LAB — HIV ANTIBODY (ROUTINE TESTING W REFLEX): HIV Screen 4th Generation wRfx: NONREACTIVE

## 2022-08-23 LAB — HEPATITIS C ANTIBODY: Hep C Virus Ab: NONREACTIVE

## 2022-08-23 LAB — RPR, QUANT+TP ABS (REFLEX)
Rapid Plasma Reagin, Quant: 1:2 {titer} — ABNORMAL HIGH
T Pallidum Abs: NONREACTIVE

## 2022-08-23 LAB — HSV 1 AND 2 AB, IGG
HSV 1 Glycoprotein G Ab, IgG: 13.7 index — ABNORMAL HIGH (ref 0.00–0.90)
HSV 2 IgG, Type Spec: <0.91 index (ref 0.00–0.90)

## 2022-08-23 LAB — RPR: RPR Ser Ql: REACTIVE — AB

## 2022-08-25 ENCOUNTER — Ambulatory Visit: Payer: Self-pay

## 2022-08-25 LAB — NUSWAB VAGINITIS PLUS (VG+)
Candida albicans, NAA: NEGATIVE
Candida glabrata, NAA: NEGATIVE
Chlamydia trachomatis, NAA: NEGATIVE
Neisseria gonorrhoeae, NAA: NEGATIVE
Trich vag by NAA: NEGATIVE

## 2022-08-26 ENCOUNTER — Encounter: Payer: Self-pay | Admitting: Obstetrics and Gynecology

## 2022-08-27 ENCOUNTER — Ambulatory Visit
Admission: RE | Admit: 2022-08-27 | Discharge: 2022-08-27 | Disposition: A | Discharge status: Home / Self Care | Payer: Medicaid Other | Source: Ambulatory Visit | Attending: Emergency Medicine | Admitting: Emergency Medicine

## 2022-08-27 VITALS — BP 123/75 | HR 89 | Temp 99.0°F | Resp 18

## 2022-08-27 DIAGNOSIS — Z113 Encounter for screening for infections with a predominantly sexual mode of transmission: Secondary | ICD-10-CM | POA: Insufficient documentation

## 2022-08-27 DIAGNOSIS — R300 Dysuria: Secondary | ICD-10-CM | POA: Insufficient documentation

## 2022-08-27 DIAGNOSIS — N898 Other specified noninflammatory disorders of vagina: Secondary | ICD-10-CM | POA: Insufficient documentation

## 2022-08-27 DIAGNOSIS — Z3202 Encounter for pregnancy test, result negative: Secondary | ICD-10-CM | POA: Diagnosis not present

## 2022-08-27 LAB — POCT URINALYSIS DIP (MANUAL ENTRY)
Bilirubin, UA: NEGATIVE
Blood, UA: NEGATIVE
Glucose, UA: NEGATIVE mg/dL
Ketones, POC UA: NEGATIVE mg/dL
Leukocytes, UA: NEGATIVE
Nitrite, UA: NEGATIVE
Spec Grav, UA: 1.025 (ref 1.010–1.025)
Urobilinogen, UA: 0.2 E.U./dL
pH, UA: 6 (ref 5.0–8.0)

## 2022-08-27 LAB — POCT URINE PREGNANCY: Preg Test, Ur: NEGATIVE

## 2022-08-27 NOTE — ED Provider Notes (Signed)
Roderic Palau    CSN: [id number] Arrival date & time: 08/27/22  1711      History   Chief Complaint Chief Complaint  Patient presents with   Urinary Frequency    Possibly UTI - Entered by patient    HPI Mariah Richardson is a 37 y.o. female.   Patient presents with dysuria, urinary frequency, suprapubic pain, lower back pain, vaginal discharge x 1 week.  She denies fever, chills, rash, hematuria, nausea, vomiting, diarrhea, pelvic pain, or other symptoms.  No treatment at home.  Her medical history includes biological false positive RPR test.   The history is provided by the patient and medical records.    Past Medical History:  Diagnosis Date   Gastritis    Hypertension     Patient Active Problem List   Diagnosis Date Noted   Sciatica of right side 01/29/2022   Non-reassuring electronic fetal monitoring tracing 06/11/2021   Labor and delivery, indication for care 05/24/2021   Indication for care in labor or delivery 05/14/2021   Back pain affecting pregnancy in third trimester 05/14/2021   Nausea 05/14/2021   Decreased fetal movements in third trimester    Syncopal episodes 05/03/2021   Pelvic pain in pregnancy, antepartum, third trimester 05/03/2021   [redacted] weeks gestation of pregnancy    Supervision of high risk pregnancy, antepartum 11/06/2020   Obesity affecting pregnancy in third trimester 11/06/2020   Acute vaginitis 06/09/2020   Chronic migraine 07/30/2019   Weight loss counseling, encounter for 02/02/2019   History of marijuana use 06/30/2017   Biological false positive RPR test 02/28/2017   Anxiety 10/03/2015    Past Surgical History:  Procedure Laterality Date   APPENDECTOMY     CHOLECYSTECTOMY      OB History     Gravida  5   Para  4   Term  4   Preterm      AB  1   Living  4      SAB      IAB      Ectopic      Multiple  0   Live Births  4            Home Medications    Prior to Admission medications   Not on  File    Family History Family History  Problem Relation Age of Onset   Diabetes Mother    Migraines Father    Diverticulitis Father    Cancer Maternal Grandmother    Stroke Maternal Grandmother    Heart disease Maternal Grandmother    Cancer Paternal Grandmother    Heart disease Paternal Grandfather    Stroke Paternal Grandfather     Social History Social History   Tobacco Use   Smoking status: Never   Smokeless tobacco: Never  Vaping Use   Vaping Use: Never used  Substance Use Topics   Alcohol use: No   Drug use: No     Allergies   Amoxicillin, Latex, and Penicillins   Review of Systems Review of Systems  Constitutional:  Negative for chills and fever.  Gastrointestinal:  Positive for abdominal pain. Negative for diarrhea, nausea and vomiting.  Genitourinary:  Positive for dysuria, frequency and vaginal discharge. Negative for flank pain, hematuria and pelvic pain.  Musculoskeletal:  Positive for back pain. Negative for gait problem.  Skin:  Negative for color change and rash.  All other systems reviewed and are negative.    Physical Exam Triage Vital Signs  ED Triage Vitals  Enc Vitals Group     BP      Pulse      Resp      Temp      Temp src      SpO2      Weight      Height      Head Circumference      Peak Flow      Pain Score      Pain Loc      Pain Edu?      Excl. in [place]?    No data found.  Updated Vital Signs BP 123/75   Pulse 89   Temp 99 F (37.2 C)   Resp 18   LMP 07/27/2022   SpO2 98%   Visual Acuity Right Eye Distance:   Left Eye Distance:   Bilateral Distance:    Right Eye Near:   Left Eye Near:    Bilateral Near:     Physical Exam Vitals and nursing note reviewed.  Constitutional:      General: She is not in acute distress.    Appearance: She is well-developed. She is not ill-appearing.  HENT:     Mouth/Throat:     Mouth: Mucous membranes are moist.  Eyes:     Conjunctiva/sclera: Conjunctivae normal.   Cardiovascular:     Rate and Rhythm: Normal rate and regular rhythm.     Heart sounds: Normal heart sounds.  Pulmonary:     Effort: Pulmonary effort is normal. No respiratory distress.     Breath sounds: Normal breath sounds.  Abdominal:     General: Bowel sounds are normal.     Palpations: Abdomen is soft.     Tenderness: There is no abdominal tenderness. There is no right CVA tenderness, left CVA tenderness, guarding or rebound.  Musculoskeletal:     Cervical back: Neck supple.  Skin:    General: Skin is warm and dry.  Neurological:     Mental Status: She is alert.  Psychiatric:        Mood and Affect: Mood normal.        Behavior: Behavior normal.      UC Treatments / Results  Labs (all labs ordered are listed, but only abnormal results are displayed) Labs Reviewed  POCT URINALYSIS DIP (MANUAL ENTRY) - Abnormal; Notable for the following components:      Result Value   Protein Ur, POC trace (*)    All other components within normal limits  RPR  HIV ANTIBODY (ROUTINE TESTING W REFLEX)  POCT URINE PREGNANCY  CERVICOVAGINAL ANCILLARY ONLY    EKG   Radiology No results found.  Procedures Procedures (including critical care time)  Medications Ordered in UC Medications - No data to display  Initial Impression / Assessment and Plan / UC Course  I have reviewed the triage vital signs and the nursing notes.  Pertinent labs & imaging results that were available during my care of the patient were reviewed by me and considered in my medical decision making (see chart for details).    STD screening, vaginal discharge, dysuria, negative pregnancy test.  Urine does not show infection, urine pregnancy negative.  Patient obtained vaginal self swab for testing.  RPR and HIV pending.  Discussed that we will call if test results are positive.  Discussed that she may require treatment at that time.  Instructed her to follow-up with her PCP or gynecologist if her symptoms are  not improving.  Patient  agrees to plan of care.   Final Clinical Impressions(s) / UC Diagnoses   Final diagnoses:  Screening for STD (sexually transmitted disease)  Vaginal discharge  Dysuria  Negative pregnancy test     Discharge Instructions      Your urine does not show infection.  Urine pregnancy is negative.     Your vaginal tests are pending.  If your test results are positive, we will call you.  You and your sexual partner(s) may require treatment at that time.  Do not have sexual activity for at least 7 days.    Follow up with your primary care provider if your symptoms are not improving.        ED Prescriptions   None    PDMP not reviewed this encounter.   Sharion Balloon, NP 08/27/22 [phone]

## 2022-08-27 NOTE — Discharge Instructions (Addendum)
Your urine does not show infection.  Urine pregnancy is negative.     Your vaginal tests are pending.  If your test results are positive, we will call you.  You and your sexual partner(s) may require treatment at that time.  Do not have sexual activity for at least 7 days.    Follow up with your primary care provider if your symptoms are not improving.

## 2022-08-27 NOTE — ED Triage Notes (Signed)
Patient to Urgent Care with complaints of urinary frequency, dysuria,suprapubic discomfort and lower back pain. Reports symptoms started one week ago.  Also request testing for BV/ yeast and STD check up (concerns for Gonorrhea) Requests blood work too. Reports white vaginal discharge.

## 2022-08-28 ENCOUNTER — Encounter: Payer: Self-pay | Admitting: Obstetrics and Gynecology

## 2022-08-28 ENCOUNTER — Telehealth: Payer: Medicaid Other | Admitting: Physician Assistant

## 2022-08-28 DIAGNOSIS — N898 Other specified noninflammatory disorders of vagina: Secondary | ICD-10-CM

## 2022-08-28 LAB — CERVICOVAGINAL ANCILLARY ONLY
Bacterial Vaginitis (gardnerella): NEGATIVE
Candida Glabrata: NEGATIVE
Candida Vaginitis: NEGATIVE
Chlamydia: NEGATIVE
Comment: NEGATIVE
Comment: NEGATIVE
Comment: NEGATIVE
Comment: NEGATIVE
Comment: NEGATIVE
Comment: NORMAL
Neisseria Gonorrhea: NEGATIVE
Trichomonas: NEGATIVE

## 2022-08-28 NOTE — Progress Notes (Signed)
Because of atypical discharge and negative BV/yeast testing at the ER, I feel your condition warrants further evaluation and I recommend that you be seen in a face to face visit.   NOTE: There will be NO CHARGE for this eVisit   If you are having a true medical emergency please call 911.      For an urgent face to face visit, [facility] has eight urgent care centers for your convenience:   NEW!! [facility] [place] [facility] [place] Get Driving Directions [phone] [address] [place], [place] [facility] [place] at [place] Get Driving Directions [phone] [place], [place] [address]   [facility] [place] [facility]) Get Driving Directions [phone] [address], [place] [address]  [facility] [place] ([place]) Get Driving Directions [phone] [address] [place],  [place]  [address]  [facility] [place] [facility] at [address] Get Driving Directions  [phone] [address] [place],  [place] [address]   [facility] Get Driving Directions [phone] [place], [place], [place] [address]   [facility] Get Driving Directions  [phone] [address] [place], [place] [address]   [facility] [place] Get Driving Directions [phone] [address], [place], [place] [address]  Your MyChart E-visit questionnaire answers were reviewed by a board certified advanced clinical practitioner to complete your personal care plan based on your specific symptoms.  Thank you for using e-Visits.

## 2022-08-29 LAB — RPR, QUANT+TP ABS (REFLEX)
Rapid Plasma Reagin, Quant: 1:1 {titer} — ABNORMAL HIGH
T Pallidum Abs: NONREACTIVE

## 2022-08-29 LAB — RPR: RPR Ser Ql: REACTIVE — AB

## 2022-08-29 LAB — HIV ANTIBODY (ROUTINE TESTING W REFLEX): HIV Screen 4th Generation wRfx: NONREACTIVE

## 2022-08-31 ENCOUNTER — Other Ambulatory Visit: Payer: Self-pay

## 2022-08-31 ENCOUNTER — Emergency Department
Admission: EM | Admit: 2022-08-31 | Discharge: 2022-09-01 | Disposition: A | Discharge status: Home / Self Care | Payer: Medicaid Other | Attending: Emergency Medicine | Admitting: Emergency Medicine

## 2022-08-31 DIAGNOSIS — R1030 Lower abdominal pain, unspecified: Secondary | ICD-10-CM | POA: Diagnosis present

## 2022-08-31 DIAGNOSIS — N390 Urinary tract infection, site not specified: Secondary | ICD-10-CM | POA: Diagnosis not present

## 2022-08-31 DIAGNOSIS — R102 Pelvic and perineal pain: Secondary | ICD-10-CM

## 2022-08-31 LAB — URINALYSIS, ROUTINE W REFLEX MICROSCOPIC
Bilirubin Urine: NEGATIVE
Glucose, UA: NEGATIVE mg/dL
Ketones, ur: NEGATIVE mg/dL
Leukocytes,Ua: NEGATIVE
Nitrite: NEGATIVE
Protein, ur: 30 mg/dL — AB
Specific Gravity, Urine: 1.024 (ref 1.005–1.030)
pH: 7 (ref 5.0–8.0)

## 2022-08-31 LAB — CBC WITH DIFFERENTIAL/PLATELET
Abs Immature Granulocytes: 0.02 10*3/uL (ref 0.00–0.07)
Basophils Absolute: 0 10*3/uL (ref 0.0–0.1)
Basophils Relative: 0 %
Eosinophils Absolute: 0.1 10*3/uL (ref 0.0–0.5)
Eosinophils Relative: 1 %
HCT: 39.8 % (ref 36.0–46.0)
Hemoglobin: 13.2 g/dL (ref 12.0–15.0)
Immature Granulocytes: 0 %
Lymphocytes Relative: 26 %
Lymphs Abs: 2.5 10*3/uL (ref 0.7–4.0)
MCH: 28.4 pg (ref 26.0–34.0)
MCHC: 33.2 g/dL (ref 30.0–36.0)
MCV: 85.8 fL (ref 80.0–100.0)
Monocytes Absolute: 0.9 10*3/uL (ref 0.1–1.0)
Monocytes Relative: 9 %
Neutro Abs: 6.3 10*3/uL (ref 1.7–7.7)
Neutrophils Relative %: 64 %
Platelets: 376 10*3/uL (ref 150–400)
RBC: 4.64 MIL/uL (ref 3.87–5.11)
RDW: 13 % (ref 11.5–15.5)
WBC: 9.9 10*3/uL (ref 4.0–10.5)
nRBC: 0 % (ref 0.0–0.2)

## 2022-08-31 LAB — COMPREHENSIVE METABOLIC PANEL
ALT: 16 U/L (ref 0–44)
AST: 17 U/L (ref 15–41)
Albumin: 3.8 g/dL (ref 3.5–5.0)
Alkaline Phosphatase: 54 U/L (ref 38–126)
Anion gap: 8 (ref 5–15)
BUN: 12 mg/dL (ref 6–20)
CO2: 26 mmol/L (ref 22–32)
Calcium: 8.8 mg/dL — ABNORMAL LOW (ref 8.9–10.3)
Chloride: 105 mmol/L (ref 98–111)
Creatinine, Ser: 0.58 mg/dL (ref 0.44–1.00)
GFR, Estimated: >60 mL/min (ref >60)
Glucose, Bld: 110 mg/dL — ABNORMAL HIGH (ref 70–99)
Potassium: 3.7 mmol/L (ref 3.5–5.1)
Sodium: 139 mmol/L (ref 135–145)
Total Bilirubin: 0.4 mg/dL (ref 0.3–1.2)
Total Protein: 7.5 g/dL (ref 6.5–8.1)

## 2022-08-31 LAB — LIPASE, BLOOD: Lipase: 34 U/L (ref 11–51)

## 2022-08-31 LAB — PREGNANCY, URINE: Preg Test, Ur: NEGATIVE

## 2022-08-31 NOTE — ED Triage Notes (Signed)
Lower abdominal pain/pelvic pain that radiates to her RUQ that began approx 1 week ago; Does have some burning with urination

## 2022-09-01 MED ORDER — CEFDINIR 300 MG PO CAPS: 300.0000 mg | ORAL_CAPSULE | Freq: Two times a day (BID) | ORAL | 0 refills | Status: AC

## 2022-09-01 MED ORDER — CEFDINIR 300 MG PO CAPS
300.0000 mg | ORAL_CAPSULE | Freq: Once | ORAL | Status: AC
  Administered 2022-09-01: 300 mg via ORAL
  Filled 2022-09-01: qty 1

## 2022-09-01 NOTE — ED Provider Notes (Signed)
[facility] Provider Note   Event Date/Time   First MD Initiated Contact with Patient 08/31/22 2335     (approximate) History  Abdominal Pain (Lower abdominal pain/pelvic pain that radiates to her RUQ that began approx 1 week ago; Does have some burning with urination)  HPI Mariah Richardson is a 37 y.o. female with a history of migraines, anxiety, and obesity who presents for lower abdominal/pelvic pain that radiates up to the right flank and has been present over the last week.  Patient also endorses dysuria and polyuria.  Patient denies any exacerbating or relieving symptoms/recent travel/sick contacts ROS: Patient currently denies any vision changes, tinnitus, difficulty speaking, facial droop, sore throat, chest pain, shortness of breath, nausea/vomiting/diarrhea, or weakness/numbness/paresthesias in any extremity   Physical Exam  Triage Vital Signs: ED Triage Vitals  Enc Vitals Group     BP 08/31/22 2144 (!) 142/84     Pulse Rate 08/31/22 2144 99     Resp 08/31/22 2144 19     Temp 08/31/22 2144 98.9 F (37.2 C)     Temp Source 08/31/22 2144 Oral     SpO2 08/31/22 2144 100 %     Weight 08/31/22 2145 239 lb (108.4 kg)     Height 08/31/22 2145 5' 2"$  (1.575 m)     Head Circumference --      Peak Flow --      Pain Score 08/31/22 2145 9     Pain Loc --      Pain Edu? --      Excl. in [place]? --    Most recent vital signs: Vitals:   08/31/22 2144 09/01/22 0053  BP: (!) 142/84 138/86  Pulse: 99 97  Resp: 19 20  Temp: 98.9 F (37.2 C) 98.8 F (37.1 C)  SpO2: 100% 99%   General: Awake, oriented x4. CV:  Good peripheral perfusion.  Resp:  Normal effort.  Abd:  No distention.  Other:  Middle-aged obese African-American female laying in bed in no acute distress ED Results / Procedures / Treatments  Labs (all labs ordered are listed, but only abnormal results are displayed) Labs Reviewed  COMPREHENSIVE METABOLIC PANEL - Abnormal; Notable for the  following components:      Result Value   Glucose, Bld 110 (*)    Calcium 8.8 (*)    All other components within normal limits  URINALYSIS, ROUTINE W REFLEX MICROSCOPIC - Abnormal; Notable for the following components:   Color, Urine YELLOW (*)    APPearance HAZY (*)    Hgb urine dipstick MODERATE (*)    Protein, ur 30 (*)    Bacteria, UA RARE (*)    All other components within normal limits  LIPASE, BLOOD  CBC WITH DIFFERENTIAL/PLATELET  PREGNANCY, URINE  PROCEDURES: Critical Care performed: No Procedures MEDICATIONS ORDERED IN ED: Medications  cefdinir (OMNICEF) capsule 300 mg (300 mg Oral Given 09/01/22 0050)   IMPRESSION / MDM / ASSESSMENT AND PLAN / ED COURSE  I reviewed the triage vital signs and the nursing notes.                             The patient is on the cardiac monitor to evaluate for evidence of arrhythmia and/or significant heart rate changes. Patient's presentation is most consistent with acute presentation with potential threat to life or bodily function. Not Pregnant. Unlikely TOA, Ovarian Torsion, PID, gonorrhea/chlamydia. Low suspicion for Infected Urolithiasis, AAA, Cholecystitis,  Pancreatitis, SBO, Appendicitis, or other acute abdomen.  Rx: Cefdinir 300 mg BID for 5 days Disposition: Discharge home. SRP discussed. Advise follow up with primary care provider within 24-72 hours.   FINAL CLINICAL IMPRESSION(S) / ED DIAGNOSES   Final diagnoses:  Acute suprapubic pain  Lower urinary tract infectious disease   Rx / DC Orders   ED Discharge Orders          Ordered    cefdinir (OMNICEF) 300 MG capsule  2 times daily        09/01/22 0040           Note:  This document was prepared using Dragon voice recognition software and may include unintentional dictation errors.   Naaman Plummer, MD 09/01/22 [phone]

## 2022-09-09 ENCOUNTER — Encounter: Payer: Self-pay | Admitting: Obstetrics and Gynecology

## 2022-09-13 ENCOUNTER — Emergency Department
Admission: EM | Admit: 2022-09-13 | Discharge: 2022-09-14 | Disposition: A | Discharge status: Home / Self Care | Payer: Medicaid Other | Attending: Emergency Medicine | Admitting: Emergency Medicine

## 2022-09-13 ENCOUNTER — Emergency Department: Payer: Medicaid Other

## 2022-09-13 ENCOUNTER — Other Ambulatory Visit: Payer: Self-pay

## 2022-09-13 DIAGNOSIS — R102 Pelvic and perineal pain: Secondary | ICD-10-CM

## 2022-09-13 DIAGNOSIS — R0602 Shortness of breath: Secondary | ICD-10-CM | POA: Diagnosis not present

## 2022-09-13 DIAGNOSIS — Z20822 Contact with and (suspected) exposure to covid-19: Secondary | ICD-10-CM | POA: Insufficient documentation

## 2022-09-13 DIAGNOSIS — R0789 Other chest pain: Secondary | ICD-10-CM | POA: Diagnosis not present

## 2022-09-13 DIAGNOSIS — I10 Essential (primary) hypertension: Secondary | ICD-10-CM | POA: Insufficient documentation

## 2022-09-13 DIAGNOSIS — R079 Chest pain, unspecified: Secondary | ICD-10-CM

## 2022-09-13 DIAGNOSIS — N888 Other specified noninflammatory disorders of cervix uteri: Secondary | ICD-10-CM | POA: Diagnosis not present

## 2022-09-13 DIAGNOSIS — N83201 Unspecified ovarian cyst, right side: Secondary | ICD-10-CM | POA: Insufficient documentation

## 2022-09-13 DIAGNOSIS — N730 Acute parametritis and pelvic cellulitis: Secondary | ICD-10-CM

## 2022-09-13 DIAGNOSIS — N739 Female pelvic inflammatory disease, unspecified: Secondary | ICD-10-CM | POA: Insufficient documentation

## 2022-09-13 DIAGNOSIS — N83202 Unspecified ovarian cyst, left side: Secondary | ICD-10-CM | POA: Diagnosis not present

## 2022-09-13 LAB — URINALYSIS, ROUTINE W REFLEX MICROSCOPIC
Bilirubin Urine: NEGATIVE
Glucose, UA: NEGATIVE mg/dL
Hgb urine dipstick: NEGATIVE
Ketones, ur: NEGATIVE mg/dL
Leukocytes,Ua: NEGATIVE
Nitrite: NEGATIVE
Protein, ur: NEGATIVE mg/dL
Specific Gravity, Urine: 1.002 — ABNORMAL LOW (ref 1.005–1.030)
pH: 6 (ref 5.0–8.0)

## 2022-09-13 LAB — CBC
HCT: 40.6 % (ref 36.0–46.0)
Hemoglobin: 13.2 g/dL (ref 12.0–15.0)
MCH: 28.4 pg (ref 26.0–34.0)
MCHC: 32.5 g/dL (ref 30.0–36.0)
MCV: 87.5 fL (ref 80.0–100.0)
Platelets: 382 10*3/uL (ref 150–400)
RBC: 4.64 MIL/uL (ref 3.87–5.11)
RDW: 12.8 % (ref 11.5–15.5)
WBC: 11.8 10*3/uL — ABNORMAL HIGH (ref 4.0–10.5)
nRBC: 0 % (ref 0.0–0.2)

## 2022-09-13 LAB — BASIC METABOLIC PANEL
Anion gap: 10 (ref 5–15)
BUN: 9 mg/dL (ref 6–20)
CO2: 24 mmol/L (ref 22–32)
Calcium: 8.9 mg/dL (ref 8.9–10.3)
Chloride: 101 mmol/L (ref 98–111)
Creatinine, Ser: 0.81 mg/dL (ref 0.44–1.00)
GFR, Estimated: >60 mL/min (ref >60)
Glucose, Bld: 118 mg/dL — ABNORMAL HIGH (ref 70–99)
Potassium: 3.8 mmol/L (ref 3.5–5.1)
Sodium: 135 mmol/L (ref 135–145)

## 2022-09-13 LAB — TROPONIN I (HIGH SENSITIVITY): Troponin I (High Sensitivity): 3 ng/L (ref <18)

## 2022-09-13 LAB — POC URINE PREG, ED: Preg Test, Ur: NEGATIVE

## 2022-09-13 MED ORDER — KETOROLAC TROMETHAMINE 30 MG/ML IJ SOLN
60.0000 mg | Freq: Once | INTRAMUSCULAR | Status: AC
  Administered 2022-09-14: 60 mg via INTRAMUSCULAR
  Filled 2022-09-13: qty 2

## 2022-09-13 NOTE — ED Provider Notes (Signed)
[facility] Provider Note    Event Date/Time   First MD Initiated Contact with Patient 09/13/22 2327     (approximate)   History   Chest Pain and Urinary Frequency   HPI  LETRICIA Richardson is a 37 y.o. female history of hypertension, gastritis, status post appendectomy and cholecystectomy who presents emergency department with complaints of right-sided chest pain today.  States it was in her back and right upper arm.  No pain with palpation of her chest and worsening with movement of the right arm.  She has had some dry cough and shortness of breath.  No fever.  No history of PE, DVT, exogenous estrogen use, recent fractures, surgery, trauma, hospitalization, prolonged travel or other immobilization. No lower extremity swelling or pain. No calf tenderness.   Also complaining of 3 weeks of bilateral lower pelvic pain that she describes as a burning pain.  No burning with urination.  She has had some watery vaginal discharge.  No abnormal odor or abnormal vaginal bleeding.  Did have some diarrhea that has resolved.  No vomiting.  Was treated with 5-day course of cefdinir for UTI recently.  States symptoms persist.  Had negative STI screening 2 to 3 weeks ago but is requesting this be repeated today.   History provided by patient.    Past Medical History:  Diagnosis Date   Gastritis    Hypertension     Past Surgical History:  Procedure Laterality Date   APPENDECTOMY     CHOLECYSTECTOMY      MEDICATIONS:  Prior to Admission medications   Not on File    Physical Exam   Triage Vital Signs: ED Triage Vitals  Enc Vitals Group     BP 09/13/22 2057 132/72     Pulse Rate 09/13/22 2057 99     Resp 09/13/22 2057 16     Temp 09/13/22 2057 98.2 F (36.8 C)     Temp Source 09/13/22 2057 Oral     SpO2 09/13/22 2057 100 %     Weight 09/13/22 2057 239 lb (108.4 kg)     Height 09/13/22 2057 '5\' 2"'$  (1.575 m)     Head Circumference --      Peak Flow --       Pain Score 09/13/22 2104 9     Pain Loc --      Pain Edu? --      Excl. in [place]? --     Most recent vital signs: Vitals:   09/14/22 0118 09/14/22 0200  BP: 113/65 (!) 106/54  Pulse: 86 81  Resp: 16   Temp: 98.3 F (36.8 C)   SpO2: 99% 100%    CONSTITUTIONAL: Alert, responds appropriately to questions. Well-appearing; well-nourished HEAD: Normocephalic, atraumatic EYES: Conjunctivae clear, pupils appear equal, sclera nonicteric ENT: normal nose; moist mucous membranes NECK: Supple, normal ROM CARD: RRR; S1 and S2 appreciated, chest wall is nontender to palpation and there is no crepitus appreciated RESP: Normal chest excursion without splinting or tachypnea; breath sounds clear and equal bilaterally; no wheezes, no rhonchi, no rales, no hypoxia or respiratory distress, speaking full sentences ABD/GI: Non-distended; soft, tender in the pelvic region bilaterally without guarding or rebound GU:  Normal external genitalia. No lesions, rashes noted. Patient has no vaginal bleeding on exam.  Thin white vaginal discharge.  No adnexal tenderness, mass.  Patient does have cervical motion tenderness.  Cervix is not appear friable.  Cervix is closed.  Chaperone present for exam. BACK:  The back appears normal EXT: Normal ROM in all joints; no deformity noted, no edema SKIN: Normal color for age and race; warm; no rash on exposed skin NEURO: Moves all extremities equally, normal speech PSYCH: The patient's mood and manner are appropriate.   ED Results / Procedures / Treatments   LABS: (all labs ordered are listed, but only abnormal results are displayed) Labs Reviewed  WET PREP, GENITAL - Abnormal; Notable for the following components:      Result Value   WBC, Wet Prep HPF POC <10 (*)    All other components within normal limits  BASIC METABOLIC PANEL - Abnormal; Notable for the following components:   Glucose, Bld 118 (*)    All other components within normal limits  CBC - Abnormal;  Notable for the following components:   WBC 11.8 (*)    All other components within normal limits  URINALYSIS, ROUTINE W REFLEX MICROSCOPIC - Abnormal; Notable for the following components:   Color, Urine STRAW (*)    APPearance CLEAR (*)    Specific Gravity, Urine 1.002 (*)    All other components within normal limits  RESP PANEL BY RT-PCR (RSV, FLU A&B, COVID)  RVPGX2  CHLAMYDIA/NGC RT PCR ([facility] ONLY)            POC URINE PREG, ED  POC URINE PREG, ED  TROPONIN I (HIGH SENSITIVITY)     EKG:  EKG Interpretation  Date/Time:  Friday September 13 2022 21:02:29 EST Ventricular Rate:  102 PR Interval:  148 QRS Duration: 82 QT Interval:  332 QTC Calculation: 432 R Axis:   70 Text Interpretation: Sinus tachycardia Otherwise normal ECG When compared with ECG of 03-May-2021 13:42, No significant change was found Confirmed by Pryor Curia [phone]) on 09/13/2022 11:27:05 PM         RADIOLOGY: My personal review and interpretation of imaging: Ultrasound shows bilateral ovarian cyst.  Chest x-ray clear.  I have personally reviewed all radiology reports.   US PELVIC COMPLETE W TRANSVAGINAL AND TORSION R/O  Result Date: 09/14/2022 CLINICAL DATA:  Pelvic pain x3 weeks. EXAM: TRANSABDOMINAL AND TRANSVAGINAL ULTRASOUND OF PELVIS DOPPLER ULTRASOUND OF OVARIES TECHNIQUE: Both transabdominal and transvaginal ultrasound examinations of the pelvis were performed. Transabdominal technique was performed for global imaging of the pelvis including uterus, ovaries, adnexal regions, and pelvic cul-de-sac. It was necessary to proceed with endovaginal exam following the transabdominal exam to visualize the uterus, endometrium, bilateral ovaries and bilateral adnexa. Color and duplex Doppler ultrasound was utilized to evaluate blood flow to the ovaries. COMPARISON:  None Available. FINDINGS: Uterus Measurements: 11.3 cm x 5.4 cm x 6.9 cm = volume: 217.6 mL. No fibroids or other mass is visualized. Multiple  small nabothian cysts are seen within the cervix. Endometrium Thickness: 20.2 mm. No focal abnormality visualized. No abnormal endometrial flow is seen on color Doppler evaluation. Right ovary Measurements: 5.03 cm x 2.66 cm x 2.76 cm = volume: 19.34 mL. 2.4 cm x 2.6 cm x 2.3 cm and 1.6 cm x 1.7 cm x 1.6 cm cysts are seen within the right ovary. No abnormal flow is noted within these regions on color Doppler evaluation. Left ovary Measurements: 3.6 cm x 2.2 cm x 2.3 cm = volume: 9.53 mL. A 1.2 cm x 1.2 cm x 1.0 cm cyst is seen within the left ovary. No abnormal flow is seen within this region on color Doppler evaluation. Pulsed Doppler evaluation of both ovaries demonstrates normal low-resistance arterial and venous waveforms. Other findings No  abnormal free fluid. IMPRESSION: 1. Bilateral simple ovarian cysts. Electronically Signed   By: Virgina Norfolk M.D.   On: 09/14/2022 01:11   DG Chest 2 View  Result Date: 09/13/2022 CLINICAL DATA:  Chest pain. Shortness of breath. EXAM: CHEST - 2 VIEW COMPARISON:  07/08/2020 FINDINGS: The cardiomediastinal contours are normal. The lungs are clear. Pulmonary vasculature is normal. No consolidation, pleural effusion, or pneumothorax. No acute osseous abnormalities are seen. IMPRESSION: Negative radiographs of the chest. Electronically Signed   By: Keith Rake M.D.   On: 09/13/2022 21:32     PROCEDURES:  Critical Care performed: No      .1-3 Lead EKG Interpretation  Performed by: Adolph Clutter, Delice Bison, DO Authorized by: Burdette Gergely, Delice Bison, DO     Interpretation: normal     ECG rate:  90   ECG rate assessment: normal     Rhythm: sinus rhythm     Ectopy: none     Conduction: normal       IMPRESSION / MDM / ASSESSMENT AND PLAN / ED COURSE  I reviewed the triage vital signs and the nursing notes.    Patient here with 2 separate complaints.  Complaining of right-sided chest pain today and also having 3 weeks of pelvic pain.  The patient is on the  cardiac monitor to evaluate for evidence of arrhythmia and/or significant heart rate changes.   DIFFERENTIAL DIAGNOSIS (includes but not limited to):   Chest wall pain, viral URI, pneumonia, less likely ACS, no risk factors for PE.  As for her abdominal pain, differential includes UTI, STI, PID, TOA, torsion, ectopic, pregnancy.  Doubt colitis, bowel obstruction, kidney stone.   Patient's presentation is most consistent with acute presentation with potential threat to life or bodily function.   PLAN: Workup initiated from triage.  Patient has a mild leukocytosis of 11.8.  Normal hemoglobin, electrolytes, renal function.  First troponin is negative.  Urine shows no sign of infection today.  Pregnancy test negative.  Chest x-ray reviewed and interpreted by myself and the radiologist and is clear.  Will obtain COVID and flu swabs given complaints of chest pain, shortness of breath and cough.  Will obtain transvaginal ultrasound with Doppler given 3 weeks of pelvic pain.  Will also perform pelvic exam with pelvic swabs as patient request that they be repeated today.  Discussed with her that I do not feel HIV and RPR need to be repeated at this time but they should be rechecked in 6 months if she has concern for possible exposure.  Will give Toradol for pain control and reassess.   MEDICATIONS GIVEN IN ED: Medications  ketorolac (TORADOL) 30 MG/ML injection 60 mg (60 mg Intramuscular Given 09/14/22 0100)  cefTRIAXone (ROCEPHIN) injection 500 mg (500 mg Intramuscular Given 09/14/22 0132)  doxycycline (VIBRA-TABS) tablet 100 mg (100 mg Oral Given 09/14/22 0126)  metroNIDAZOLE (FLAGYL) tablet 500 mg (500 mg Oral Given 09/14/22 0126)     ED COURSE: Patient does have cervical motion tenderness on exam.  Will cover for PID.  Ultrasound reviewed and interpreted by myself and the radiologist and shows bilateral ovarian cyst without torsion.  Wet prep unremarkable.  GC and chlamydia pending.  Patient reports  pain improved with Toradol and declines any further pain medication.  She states that Tylenol and ibuprofen have not been enough to control her pain at home.  Will discharge with short course of narcotic analgesia for pain control.  Will discharge with 2 weeks of doxycycline and Flagyl to  cover for PID given cervical motion tenderness.  Given outpatient PCP and OB/GYN follow-up information.   At this time, I do not feel there is any life-threatening condition present. I reviewed all nursing notes, vitals, pertinent previous records.  All lab and urine results, EKGs, imaging ordered have been independently reviewed and interpreted by myself.  I reviewed all available radiology reports from any imaging ordered this visit.  Based on my assessment, I feel the patient is safe to be discharged home without further emergent workup and can continue workup as an outpatient as needed. Discussed all findings, treatment plan as well as usual and customary return precautions.  They verbalize understanding and are comfortable with this plan.  Outpatient follow-up has been provided as needed.  All questions have been answered.    CONSULTS:  nonhe   OUTSIDE RECORDS REVIEWED: Reviewed urgent care note on 08/27/2022.       FINAL CLINICAL IMPRESSION(S) / ED DIAGNOSES   Final diagnoses:  Pelvic pain  Right-sided chest pain  PID (acute pelvic inflammatory disease)  Cysts of both ovaries     Rx / DC Orders   ED Discharge Orders          Ordered    doxycycline (VIBRA-TABS) 100 MG tablet  2 times daily        09/14/22 0234    metroNIDAZOLE (FLAGYL) 500 MG tablet  2 times daily,   Status:  Discontinued        09/14/22 0234    HYDROcodone-acetaminophen (NORCO/VICODIN) 5-325 MG tablet  Every 6 hours PRN        09/14/22 0234    ondansetron (ZOFRAN-ODT) 4 MG disintegrating tablet  Every 6 hours PRN        09/14/22 0234    ibuprofen (ADVIL) 800 MG tablet  Every 8 hours PRN        09/14/22 0234     metroNIDAZOLE (FLAGYL) 500 MG tablet  2 times daily        09/14/22 0235             Note:  This document was prepared using Dragon voice recognition software and may include unintentional dictation errors.   Eudell Julian, Delice Bison, DO 09/14/22 [phone]

## 2022-09-13 NOTE — ED Triage Notes (Signed)
Pt to ED  via POV C/O back pain, chest pain, and uti symptoms. Pt having frequent urination, lower abd pain, and some burning with urination for about 3 weeks now. Pt was seen here for same. Pt was given antibiotics and finished them. Pt also endorses right sided chest pain that started an hr ago that radiates to upper arm and back. Pt having SOB with CP.

## 2022-09-14 ENCOUNTER — Emergency Department: Payer: Medicaid Other

## 2022-09-14 DIAGNOSIS — N888 Other specified noninflammatory disorders of cervix uteri: Secondary | ICD-10-CM | POA: Diagnosis not present

## 2022-09-14 DIAGNOSIS — R102 Pelvic and perineal pain: Secondary | ICD-10-CM | POA: Diagnosis not present

## 2022-09-14 DIAGNOSIS — N83202 Unspecified ovarian cyst, left side: Secondary | ICD-10-CM | POA: Diagnosis not present

## 2022-09-14 LAB — WET PREP, GENITAL
Clue Cells Wet Prep HPF POC: NONE SEEN
Sperm: NONE SEEN
Trich, Wet Prep: NONE SEEN
WBC, Wet Prep HPF POC: <10 — AB (ref <10)
Yeast Wet Prep HPF POC: NONE SEEN

## 2022-09-14 LAB — CHLAMYDIA/NGC RT PCR (ARMC ONLY)
Chlamydia Tr: NOT DETECTED
N gonorrhoeae: NOT DETECTED

## 2022-09-14 LAB — RESP PANEL BY RT-PCR (RSV, FLU A&B, COVID)  RVPGX2
Influenza A by PCR: NEGATIVE
Influenza B by PCR: NEGATIVE
Resp Syncytial Virus by PCR: NEGATIVE
SARS Coronavirus 2 by RT PCR: NEGATIVE

## 2022-09-14 MED ORDER — METRONIDAZOLE 500 MG PO TABS: 500.0000 mg | ORAL_TABLET | Freq: Two times a day (BID) | ORAL | 0 refills | Status: DC

## 2022-09-14 MED ORDER — IBUPROFEN 800 MG PO TABS: 800.0000 mg | ORAL_TABLET | Freq: Three times a day (TID) | ORAL | 0 refills | Status: DC | PRN

## 2022-09-14 MED ORDER — CEFTRIAXONE SODIUM 500 MG IJ SOLR
500.0000 mg | Freq: Once | INTRAMUSCULAR | Status: AC
  Administered 2022-09-14: 500 mg via INTRAMUSCULAR
  Filled 2022-09-14: qty 500

## 2022-09-14 MED ORDER — ONDANSETRON 4 MG PO TBDP: 4.0000 mg | ORAL_TABLET | Freq: Four times a day (QID) | ORAL | 0 refills | Status: DC | PRN

## 2022-09-14 MED ORDER — HYDROCODONE-ACETAMINOPHEN 5-325 MG PO TABS: 2.0000 | ORAL_TABLET | Freq: Four times a day (QID) | ORAL | 0 refills | Status: DC | PRN

## 2022-09-14 MED ORDER — DOXYCYCLINE HYCLATE 100 MG PO TABS
100.0000 mg | ORAL_TABLET | Freq: Once | ORAL | Status: AC
  Administered 2022-09-14: 100 mg via ORAL
  Filled 2022-09-14: qty 1

## 2022-09-14 MED ORDER — DOXYCYCLINE HYCLATE 100 MG PO TABS: 100.0000 mg | ORAL_TABLET | Freq: Two times a day (BID) | ORAL | 0 refills | Status: DC

## 2022-09-14 MED ORDER — METRONIDAZOLE 500 MG PO TABS
500.0000 mg | ORAL_TABLET | Freq: Once | ORAL | Status: AC
  Administered 2022-09-14: 500 mg via ORAL
  Filled 2022-09-14: qty 1

## 2022-09-14 NOTE — Discharge Instructions (Addendum)
You are being provided a prescription for opiates (also known as narcotics) for pain control.  Opiates can be addictive and should only be used when absolutely necessary for pain control when other alternatives do not work.  We recommend you only use them for the recommended amount of time and only as prescribed.  Please do not take with other sedative medications or alcohol.  Please do not drive, operate machinery, make important decisions while taking opiates.  Please note that these medications can be addictive and have high abuse potential.  Patients can become addicted to narcotics after only taking them for a few days.  Please keep these medications locked away from children, teenagers or any family members with history of substance abuse.  Narcotic pain medicine may also make you constipated.  You may use over-the-counter medications such as MiraLAX, Colace to prevent constipation.  If you become constipated, you may use over-the-counter enemas as needed.  Itching and nausea are also common side effects of narcotic pain medication.  If you develop uncontrolled vomiting or a rash, please stop these medications and seek medical care.  Please go to the following website to schedule new (and existing) patient appointments:   [email or web address]   The following is a list of primary care offices in the area who are accepting new patients at this time.  Please reach out to one of them directly and let them know you would like to schedule an appointment to follow up on an Emergency Department visit, and/or to establish a new primary care provider (PCP).  There are likely other primary care clinics in the are who are accepting new patients, but this is an excellent place to start:  [place] physician: Dr Lavon Paganini [address] [place], [place] [address] [phone]  [facility] Lead Physician: Dr Steele Sizer [address], [place], [place] [address] [phone]  [place] Physician: Dr Park Liter [address] [place], [place] [address] [phone]  [facility] Lead Physician: Dr Dewaine Oats [address], [place], [place] [address] [phone]  [facility] [place] Physician: Dr Halina Maidens [address] [place], [place], [place] [address] [phone]

## 2022-09-19 ENCOUNTER — Ambulatory Visit
Admission: RE | Admit: 2022-09-19 | Discharge: 2022-09-19 | Disposition: A | Discharge status: Home / Self Care | Payer: Medicaid Other | Source: Ambulatory Visit | Attending: Emergency Medicine | Admitting: Emergency Medicine

## 2022-09-19 ENCOUNTER — Ambulatory Visit: Payer: Medicaid Other

## 2022-09-19 VITALS — BP 137/78 | HR 98 | Temp 98.4°F | Resp 18

## 2022-09-19 DIAGNOSIS — Z3202 Encounter for pregnancy test, result negative: Secondary | ICD-10-CM | POA: Insufficient documentation

## 2022-09-19 DIAGNOSIS — R197 Diarrhea, unspecified: Secondary | ICD-10-CM | POA: Diagnosis not present

## 2022-09-19 DIAGNOSIS — N898 Other specified noninflammatory disorders of vagina: Secondary | ICD-10-CM | POA: Diagnosis not present

## 2022-09-19 LAB — POCT URINE PREGNANCY: Preg Test, Ur: NEGATIVE

## 2022-09-19 LAB — POC URINE PREG, ED: Preg Test, Ur: NEGATIVE

## 2022-09-19 NOTE — ED Provider Notes (Signed)
Roderic Palau    CSN: [id number] Arrival date & time: 09/19/22  1342      History   Chief Complaint Chief Complaint  Patient presents with   Diarrhea    Multiple issues - Entered by patient    HPI Mariah Richardson is a 37 y.o. female.  Patient presents with diarrhea x 1 week.  She reports four episodes of diarrhea today.  She also reports vaginal discharge and irritation x 1 month.  She denies fever, chills, rash, shortness of breath, chest pain, abdominal pain, vomiting, dysuria, flank pain, pelvic pain, or other symptoms.  No OTC medications today.   Patient was seen here on 08/27/2022; diagnosed with STD screening, vaginal discharge, dysuria, negative pregnancy test.  She had an e-visit on 08/28/2022; diagnosed with vaginal discharge; instructed to be seen in person.  She was seen at [facility] ED on 08/31/2022; diagnosed with acute suprapubic pain and lower UTI; treated with cefdinir x 5 days.  She was seen again at [facility] ED on 09/13/2022; diagnosed with pelvic pain, chest pain, PID, cysts on both ovaries; treated with ceftriaxone, doxycycline, hydrocodone-acetaminophen, ibuprofen, metronidazole, Zofran.  Her medical history includes biological false positive RPR test.   The history is provided by the patient and medical records.    Past Medical History:  Diagnosis Date   Gastritis    Hypertension     Patient Active Problem List   Diagnosis Date Noted   Sciatica of right side 01/29/2022   Non-reassuring electronic fetal monitoring tracing 06/11/2021   Labor and delivery, indication for care 05/24/2021   Indication for care in labor or delivery 05/14/2021   Back pain affecting pregnancy in third trimester 05/14/2021   Nausea 05/14/2021   Decreased fetal movements in third trimester    Syncopal episodes 05/03/2021   Pelvic pain in pregnancy, antepartum, third trimester 05/03/2021   [redacted] weeks gestation of pregnancy    Supervision of high risk pregnancy, antepartum  11/06/2020   Obesity affecting pregnancy in third trimester 11/06/2020   Acute vaginitis 06/09/2020   Chronic migraine 07/30/2019   Weight loss counseling, encounter for 02/02/2019   History of marijuana use 06/30/2017   Biological false positive RPR test 02/28/2017   Anxiety 10/03/2015    Past Surgical History:  Procedure Laterality Date   APPENDECTOMY     CHOLECYSTECTOMY      OB History     Gravida  5   Para  4   Term  4   Preterm      AB  1   Living  4      SAB      IAB      Ectopic      Multiple  0   Live Births  4            Home Medications    Prior to Admission medications   Medication Sig Start Date End Date Taking? Authorizing Provider  doxycycline (VIBRA-TABS) 100 MG tablet Take 1 tablet (100 mg total) by mouth 2 (two) times daily for 14 days. Patient not taking: Reported on 09/19/2022 09/14/22 09/28/22  Ward, Delice Bison, DO  HYDROcodone-acetaminophen (NORCO/VICODIN) 5-325 MG tablet Take 2 tablets by mouth every 6 (six) hours as needed. Patient not taking: Reported on 09/19/2022 09/14/22   Ward, Delice Bison, DO  ibuprofen (ADVIL) 800 MG tablet Take 1 tablet (800 mg total) by mouth every 8 (eight) hours as needed. Patient not taking: Reported on 09/19/2022 09/14/22   Ward, Delice Bison,  DO  metroNIDAZOLE (FLAGYL) 500 MG tablet Take 1 tablet (500 mg total) by mouth 2 (two) times daily for 14 days. Do not drink alcohol with this medication. Patient not taking: Reported on 09/19/2022 09/14/22 09/28/22  Ward, Delice Bison, DO  ondansetron (ZOFRAN-ODT) 4 MG disintegrating tablet Take 1 tablet (4 mg total) by mouth every 6 (six) hours as needed for nausea or vomiting. Patient not taking: Reported on 09/19/2022 09/14/22   Ward, Delice Bison, DO    Family History Family History  Problem Relation Age of Onset   Diabetes Mother    Migraines Father    Diverticulitis Father    Cancer Maternal Grandmother    Stroke Maternal Grandmother    Heart disease Maternal Grandmother     Cancer Paternal Grandmother    Heart disease Paternal Grandfather    Stroke Paternal Grandfather     Social History Social History   Tobacco Use   Smoking status: Never   Smokeless tobacco: Never  Vaping Use   Vaping Use: Never used  Substance Use Topics   Alcohol use: No   Drug use: No     Allergies   Amoxicillin, Latex, and Penicillins   Review of Systems Review of Systems  Constitutional:  Negative for chills and fever.  Respiratory:  Negative for cough and shortness of breath.   Cardiovascular:  Negative for chest pain and palpitations.  Gastrointestinal:  Positive for diarrhea. Negative for abdominal pain and vomiting.  Genitourinary:  Positive for vaginal discharge. Negative for dysuria, flank pain, hematuria and pelvic pain.  Skin:  Negative for rash.  All other systems reviewed and are negative.    Physical Exam Triage Vital Signs ED Triage Vitals  Enc Vitals Group     BP 09/19/22 1405 137/78     Pulse Rate 09/19/22 1405 98     Resp 09/19/22 1405 18     Temp 09/19/22 1405 98.4 F (36.9 C)     Temp src --      SpO2 09/19/22 1405 98 %     Weight --      Height --      Head Circumference --      Peak Flow --      Pain Score 09/19/22 1403 10     Pain Loc --      Pain Edu? --      Excl. in [place]? --    No data found.  Updated Vital Signs BP 137/78   Pulse 98   Temp 98.4 F (36.9 C)   Resp 18   LMP 08/30/2022 (Exact Date)   SpO2 98%   Visual Acuity Right Eye Distance:   Left Eye Distance:   Bilateral Distance:    Right Eye Near:   Left Eye Near:    Bilateral Near:     Physical Exam Vitals and nursing note reviewed.  Constitutional:      General: She is not in acute distress.    Appearance: Normal appearance. She is well-developed. She is not ill-appearing.  HENT:     Mouth/Throat:     Mouth: Mucous membranes are moist.  Cardiovascular:     Rate and Rhythm: Normal rate and regular rhythm.     Heart sounds: Normal heart sounds.   Pulmonary:     Effort: Pulmonary effort is normal. No respiratory distress.     Breath sounds: Normal breath sounds.  Abdominal:     General: Bowel sounds are normal.     Palpations: Abdomen is soft.  Tenderness: There is no abdominal tenderness. There is no right CVA tenderness, left CVA tenderness, guarding or rebound.  Musculoskeletal:     Cervical back: Neck supple.  Skin:    General: Skin is warm and dry.  Neurological:     Mental Status: She is alert.  Psychiatric:        Mood and Affect: Mood normal.        Behavior: Behavior normal.      UC Treatments / Results  Labs (all labs ordered are listed, but only abnormal results are displayed) Labs Reviewed  HIV ANTIBODY (ROUTINE TESTING W REFLEX)  RPR  POCT URINE PREGNANCY  CERVICOVAGINAL ANCILLARY ONLY    EKG   Radiology No results found.  Procedures Procedures (including critical care time)  Medications Ordered in UC Medications - No data to display  Initial Impression / Assessment and Plan / UC Course  I have reviewed the triage vital signs and the nursing notes.  Pertinent labs & imaging results that were available during my care of the patient were reviewed by me and considered in my medical decision making (see chart for details).    Vaginal discharge, vaginal irritation, Diarrhea, negative pregnancy test.  Afebrile, VSS. Abdomen is soft and nontender with normal bowel sounds.  Treating with diarrhea diet.  ED precautions discussed as patient has been on several antibiotics in the past few weeks.  Patient obtained vaginal self swab for testing.  Per patient request, RPR and HIV also pending.  Discussed that we will call if test results are positive.  Discussed that she may require treatment at that time.  Discussed that sexual partner(s) may also require treatment.  Instructed patient to abstain from sexual activity for at least 7 days.  Instructed her to follow-up with her PCP or gynecologist.  Patient  agrees to plan of care.   Final Clinical Impressions(s) / UC Diagnoses   Final diagnoses:  Vaginal discharge  Vaginal irritation  Diarrhea, unspecified type  Negative pregnancy test     Discharge Instructions      Follow the diarrhea diet as tolerated.  Go to the emergency department if your have pertsistant or worsening symptoms.   Your tests are pending.  If your test results are positive, we will call you.  You and your sexual partner(s) may require treatment at that time.  Do not have sexual activity for at least 7 days.    Please follow up with your doctor.             ED Prescriptions   None    PDMP not reviewed this encounter.   Sharion Balloon, NP 09/19/22 1430

## 2022-09-19 NOTE — Discharge Instructions (Addendum)
Follow the diarrhea diet as tolerated.  Go to the emergency department if your have pertsistant or worsening symptoms.   Your tests are pending.  If your test results are positive, we will call you.  You and your sexual partner(s) may require treatment at that time.  Do not have sexual activity for at least 7 days.    Please follow up with your doctor.

## 2022-09-19 NOTE — ED Triage Notes (Addendum)
Patient to Urgent Care with complaints of diarrhea x1.5 weeks and vaginal discharge/ irritation x1 month.  Patient seen by multiple facilities due to vaginal discharge/ irritation. Reports it has been unchanged.  Patient treated for UTI. States that she was treated with cefdinir 2/10. Reports that she then started having liquid diarrhea.   Patient was then seen in the ER 2/23 , diagnosed with bilateral ovarian cysts and placed on flagyl and doxycycline. Stopped taking these because she felt like they were making her symptoms worse and she didn't understand why she was prescribed these.   States she wants further STD testing via blood and swab/ testing today.

## 2022-09-20 ENCOUNTER — Telehealth: Payer: Medicaid Other | Admitting: Physician Assistant

## 2022-09-20 DIAGNOSIS — B3731 Acute candidiasis of vulva and vagina: Secondary | ICD-10-CM

## 2022-09-20 LAB — CERVICOVAGINAL ANCILLARY ONLY
Bacterial Vaginitis (gardnerella): NEGATIVE
Candida Glabrata: POSITIVE — AB
Candida Vaginitis: POSITIVE — AB
Chlamydia: NEGATIVE
Comment: NEGATIVE
Comment: NEGATIVE
Comment: NEGATIVE
Comment: NEGATIVE
Comment: NEGATIVE
Comment: NORMAL
Neisseria Gonorrhea: NEGATIVE
Trichomonas: NEGATIVE

## 2022-09-20 MED ORDER — TERCONAZOLE 0.8 % VA CREA: 1.0000 | TOPICAL_CREAM | Freq: Every day | VAGINAL | 0 refills | Status: DC

## 2022-09-20 MED ORDER — FLUCONAZOLE 150 MG PO TABS: 150.0000 mg | ORAL_TABLET | ORAL | 0 refills | Status: DC | PRN

## 2022-09-20 NOTE — Progress Notes (Signed)
E-Visit for Vaginal Symptoms  We are sorry that you are not feeling well. Here is how we plan to help! Based on what you shared with me it looks like you: May have a yeast vaginosis. Your test results were positive for 2 different strains of yeast. Candida albicans (most common) and Candida glabrata.  Vaginosis is an inflammation of the vagina that can result in discharge, itching and pain. The cause is usually a change in the normal balance of vaginal bacteria or an infection. Vaginosis can also result from reduced estrogen levels after menopause.  The most common causes of vaginosis are:   Bacterial vaginosis which results from an overgrowth of one on several organisms that are normally present in your vagina.   Yeast infections which are caused by a naturally occurring fungus called candida.   Vaginal atrophy (atrophic vaginosis) which results from the thinning of the vagina from reduced estrogen levels after menopause.   Trichomoniasis which is caused by a parasite and is commonly transmitted by sexual intercourse.  Factors that increase your risk of developing vaginosis include: Medications, such as antibiotics and steroids Uncontrolled diabetes Use of hygiene products such as bubble bath, vaginal spray or vaginal deodorant Douching Wearing damp or tight-fitting clothing Using an intrauterine device (IUD) for birth control Hormonal changes, such as those associated with pregnancy, birth control pills or menopause Sexual activity Having a sexually transmitted infection  Your treatment plan is Diflucan (fluconazole) '150mg'$  tablet once, repeat in 72 hours.  I have also prescribed Topical Terconazole cream to use one applicatorful nightly for 3 days.  I have electronically sent this prescription into the pharmacy that you have chosen.  Be sure to take all of the medication as directed. Stop taking any medication if you develop a rash, tongue swelling or shortness of breath. Mothers who  are breast feeding should consider pumping and discarding their breast milk while on these antibiotics. However, there is no consensus that infant exposure at these doses would be harmful.  Remember that medication creams can weaken latex condoms. Marland Kitchen   HOME CARE:  Good hygiene may prevent some types of vaginosis from recurring and may relieve some symptoms:  Avoid baths, hot tubs and whirlpool spas. Rinse soap from your outer genital area after a shower, and dry the area well to prevent irritation. Don't use scented or harsh soaps, such as those with deodorant or antibacterial action. Avoid irritants. These include scented tampons and pads. Wipe from front to back after using the toilet. Doing so avoids spreading fecal bacteria to your vagina.  Other things that may help prevent vaginosis include:  Don't douche. Your vagina doesn't require cleansing other than normal bathing. Repetitive douching disrupts the normal organisms that reside in the vagina and can actually increase your risk of vaginal infection. Douching won't clear up a vaginal infection. Use a latex condom. Both female and female latex condoms may help you avoid infections spread by sexual contact. Wear cotton underwear. Also wear pantyhose with a cotton crotch. If you feel comfortable without it, skip wearing underwear to bed. Yeast thrives in [employer] Your symptoms should improve in the next day or two.  GET HELP RIGHT AWAY IF:  You have pain in your lower abdomen ( pelvic area or over your ovaries) You develop nausea or vomiting You develop a fever Your discharge changes or worsens You have persistent pain with intercourse You develop shortness of breath, a rapid pulse, or you faint.  These symptoms could be signs  of problems or infections that need to be evaluated by a medical provider now.  MAKE SURE YOU   Understand these instructions. Will watch your condition. Will get help right away if you are not  doing well or get worse.  Thank you for choosing an e-visit.  Your e-visit answers were reviewed by a board certified advanced clinical practitioner to complete your personal care plan. Depending upon the condition, your plan could have included both over the counter or prescription medications.  Please review your pharmacy choice. Make sure the pharmacy is open so you can pick up prescription now. If there is a problem, you may contact your provider through [employer] and have the prescription routed to another pharmacy.  Your safety is important to [place]. If you have drug allergies check your prescription carefully.   For the next 24 hours you can use MyChart to ask questions about today's visit, request a non-urgent call back, or ask for a work or school excuse. You will get an email in the next two days asking about your experience. I hope that your e-visit has been valuable and will speed your recovery.  I have spent 5 minutes in review of e-visit questionnaire, review and updating patient chart, medical decision making and response to patient.   Mar Daring, PA-C

## 2022-09-21 LAB — HIV ANTIBODY (ROUTINE TESTING W REFLEX): HIV Screen 4th Generation wRfx: NONREACTIVE

## 2022-09-21 LAB — RPR, QUANT+TP ABS (REFLEX)
Rapid Plasma Reagin, Quant: 1:2 {titer} — ABNORMAL HIGH
T Pallidum Abs: NONREACTIVE

## 2022-09-21 LAB — RPR: RPR Ser Ql: REACTIVE — AB

## 2022-09-23 DIAGNOSIS — R309 Painful micturition, unspecified: Secondary | ICD-10-CM | POA: Diagnosis not present

## 2022-09-23 DIAGNOSIS — Z113 Encounter for screening for infections with a predominantly sexual mode of transmission: Secondary | ICD-10-CM | POA: Diagnosis not present

## 2022-09-24 ENCOUNTER — Ambulatory Visit: Payer: Medicaid Other

## 2022-10-13 ENCOUNTER — Telehealth: Payer: Medicaid Other | Admitting: Physician Assistant

## 2022-10-13 DIAGNOSIS — R112 Nausea with vomiting, unspecified: Secondary | ICD-10-CM

## 2022-10-13 MED ORDER — LOPERAMIDE HCL 2 MG PO TABS: 2.0000 mg | ORAL_TABLET | Freq: Four times a day (QID) | ORAL | 0 refills | Status: DC | PRN

## 2022-10-13 MED ORDER — ONDANSETRON HCL 4 MG PO TABS: 4.0000 mg | ORAL_TABLET | Freq: Three times a day (TID) | ORAL | 0 refills | Status: DC | PRN

## 2022-10-13 NOTE — Progress Notes (Signed)
Virtual Visit Consent   Mariah Richardson, you are scheduled for a virtual visit with a [facility] provider today. Just as with appointments in the office, your consent must be obtained to participate. Your consent will be active for this visit and any virtual visit you may have with one of our providers in the next 365 days. If you have a MyChart account, a copy of this consent can be sent to you electronically.  As this is a virtual visit, video technology does not allow for your provider to perform a traditional examination. This may limit your provider's ability to fully assess your condition. If your provider identifies any concerns that need to be evaluated in person or the need to arrange testing (such as labs, EKG, etc.), we will make arrangements to do so. Although advances in technology are sophisticated, we cannot ensure that it will always work on either your end or our end. If the connection with a video visit is poor, the visit may have to be switched to a telephone visit. With either a video or telephone visit, we are not always able to ensure that we have a secure connection.  By engaging in this virtual visit, you consent to the provision of healthcare and authorize for your insurance to be billed (if applicable) for the services provided during this visit. Depending on your insurance coverage, you may receive a charge related to this service.  I need to obtain your verbal consent now. Are you willing to proceed with your visit today? Mariah Richardson has provided verbal consent on 10/13/2022 for a virtual visit (video or telephone). Inda Coke, [place]  Date: 10/13/2022 2:16 PM  Virtual Visit via Video Note   I, Inda Coke, connected with  Mariah Richardson  ([id number], 11-Feb-1986) on 10/13/22 at  2:15 PM EDT by a video-enabled telemedicine application and verified that I am speaking with the correct person using two identifiers.  Location: Patient: Virtual Visit Location  Patient: Home Provider: Virtual Visit Location Provider: Home Office   I discussed the limitations of evaluation and management by telemedicine and the availability of in person appointments. The patient expressed understanding and agreed to proceed.    History of Present Illness: Mariah Richardson is a 37 y.o. who identifies as a female who was assigned female at birth, and is being seen today for GI illness.  Daughter recently had stomach bug, lasted 24 hours. Patient reports that last night around 6p she started having n/v/d. She is drinking water, taking ibuprofen and tylenol  Has vomited a total of 4 times Diarrhea every times she goes  Denies: concerns for pregnancy, blood in stool/emesis, UTI symptoms, dizziness, lightheadedness, pre-syncope, severe abdominal pain   HPI: HPI  Problems:  Patient Active Problem List   Diagnosis Date Noted   Sciatica of right side 01/29/2022   Non-reassuring electronic fetal monitoring tracing 06/11/2021   Labor and delivery, indication for care 05/24/2021   Indication for care in labor or delivery 05/14/2021   Back pain affecting pregnancy in third trimester 05/14/2021   Nausea 05/14/2021   Decreased fetal movements in third trimester    Syncopal episodes 05/03/2021   Pelvic pain in pregnancy, antepartum, third trimester 05/03/2021   [redacted] weeks gestation of pregnancy    Supervision of high risk pregnancy, antepartum 11/06/2020   Obesity affecting pregnancy in third trimester 11/06/2020   Acute vaginitis 06/09/2020   Chronic migraine 07/30/2019   Weight loss counseling, encounter for 02/02/2019   History  of marijuana use 06/30/2017   Biological false positive RPR test 02/28/2017   Anxiety 10/03/2015    Allergies:  Allergies  Allergen Reactions   Amoxicillin Hives   Latex    Penicillins Hives   Medications:  Current Outpatient Medications:    loperamide (IMODIUM A-D) 2 MG tablet, Take 1 tablet (2 mg total) by mouth 4 (four) times  daily as needed for diarrhea or loose stools., Disp: 30 tablet, Rfl: 0   ondansetron (ZOFRAN) 4 MG tablet, Take 1 tablet (4 mg total) by mouth every 8 (eight) hours as needed for nausea or vomiting., Disp: 20 tablet, Rfl: 0   fluconazole (DIFLUCAN) 150 MG tablet, Take 1 tablet (150 mg total) by mouth every 3 (three) days as needed., Disp: 2 tablet, Rfl: 0   terconazole (TERAZOL 3) 0.8 % vaginal cream, Place 1 applicator vaginally at bedtime., Disp: 20 g, Rfl: 0  Observations/Objective: Patient is well-developed, well-nourished in no acute distress.  Resting comfortably  at home.  Head is normocephalic, atraumatic.  No labored breathing.  Speech is clear and coherent with logical content.  Patient is alert and oriented at baseline.   Assessment and Plan: 1. Nausea and vomiting, unspecified vomiting type No red flags on visual exam or discussion.  Will initiate zofran per orders. If nausea stops and diarrhea persists, we did discuss trialing imodium.  Low threshold to seek in - office care if poor fluid intake or concerns for dehydration.  Follow Up Instructions: I discussed the assessment and treatment plan with the patient. The patient was provided an opportunity to ask questions and all were answered. The patient agreed with the plan and demonstrated an understanding of the instructions.  A copy of instructions were sent to the patient via MyChart unless otherwise noted below.   The patient was advised to call back or seek an in-person evaluation if the symptoms worsen or if the condition fails to improve as anticipated.  Time:  I spent 5-10 minutes with the patient via telehealth technology discussing the above problems/concerns.    Inda Coke, [place]

## 2022-10-15 IMAGING — US US OB < 14 WEEKS - US OB TV
1 series · 14 of 28 positions shown · non-contrast
Comparison: None.

CLINICAL DATA: 34-year-old pregnant female with pelvic pain for 2
days. Estimated gestational age of 4 weeks 4 days by LMP. Beta HCG
of [DATE].

EXAM:
OBSTETRIC <14 WK US AND TRANSVAGINAL OB US
TECHNIQUE: Both transabdominal and transvaginal ultrasound examinations were
performed for complete evaluation of the gestation as well as the
maternal uterus, adnexal regions, and pelvic cul-de-sac.
Transvaginal technique was performed to assess early pregnancy.

[Series 1: us ob less than 14 weeks with ob transvaginal · 128 acquisitions, 14 frames shown]
[im 5/128]
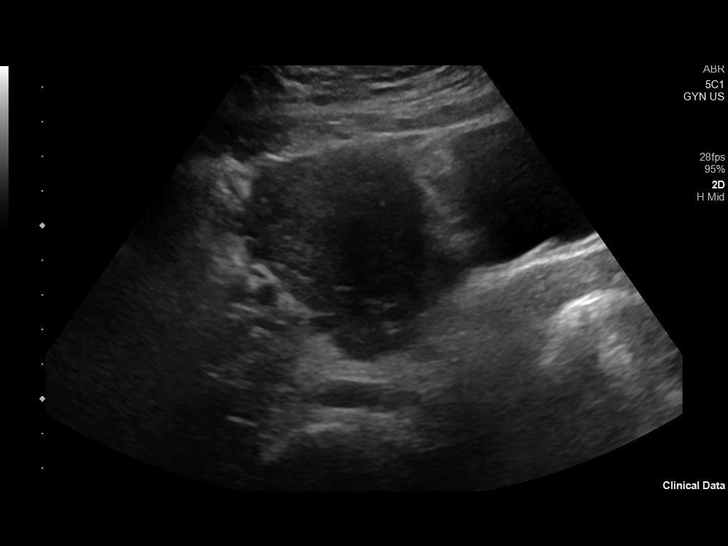
[im 15/128]
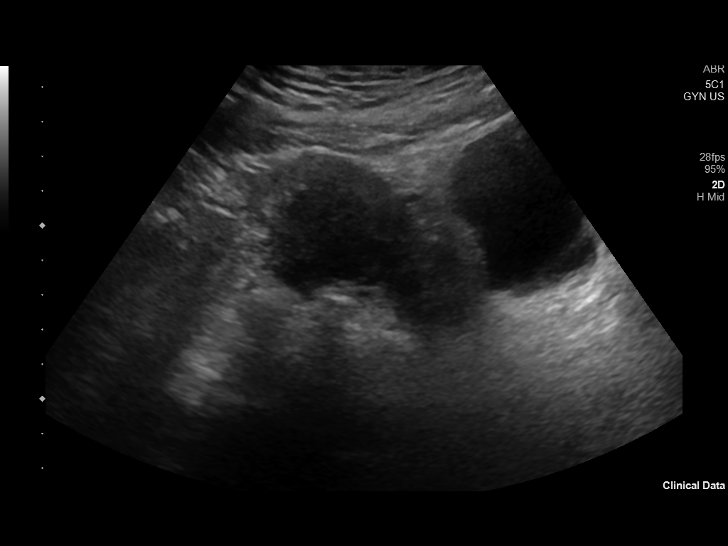
[im 24/128]
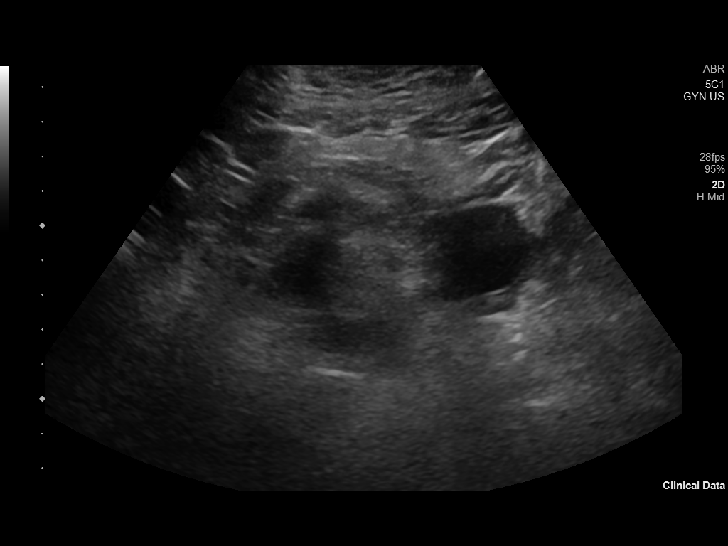
[im 33/128]
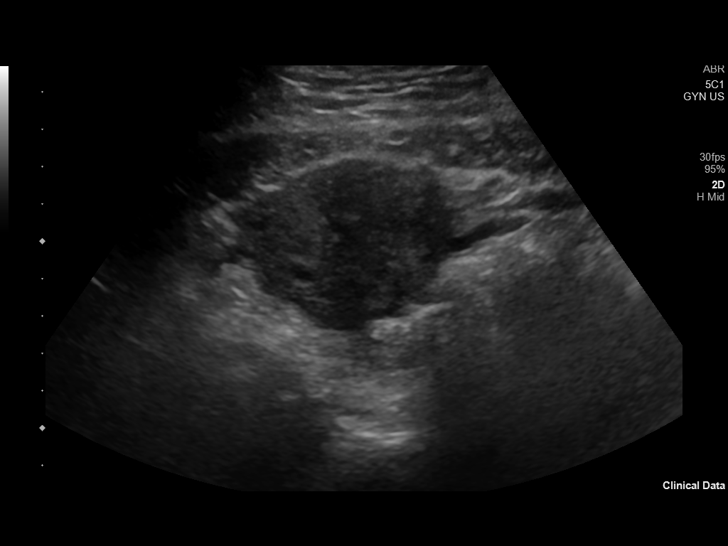
[im 43/128]
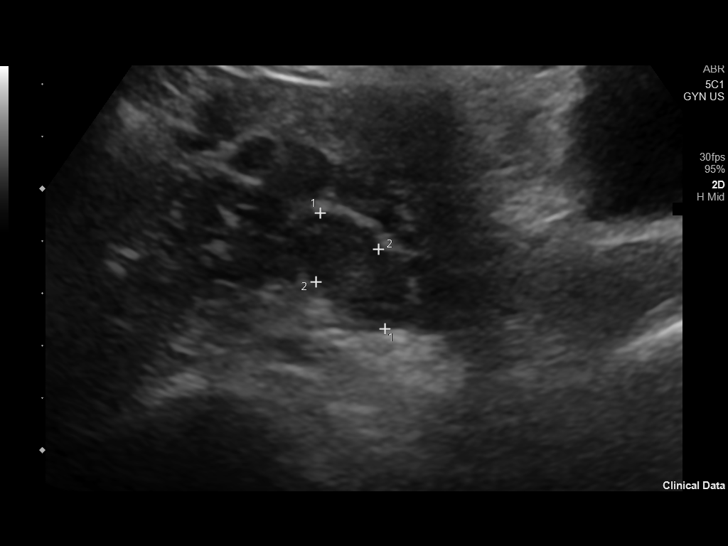
[im 52/128]
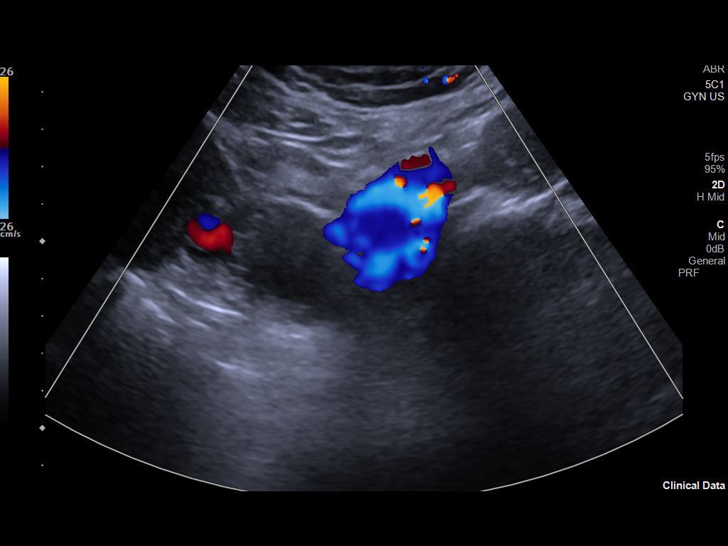
[im 62/128]
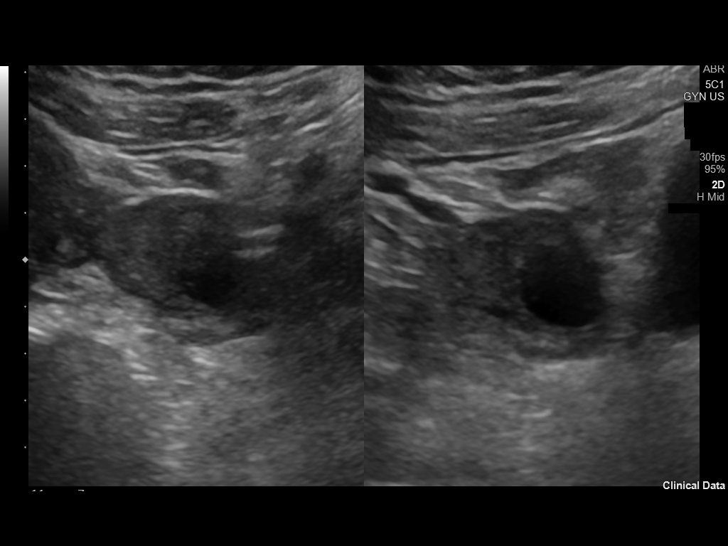
[im 71/128]
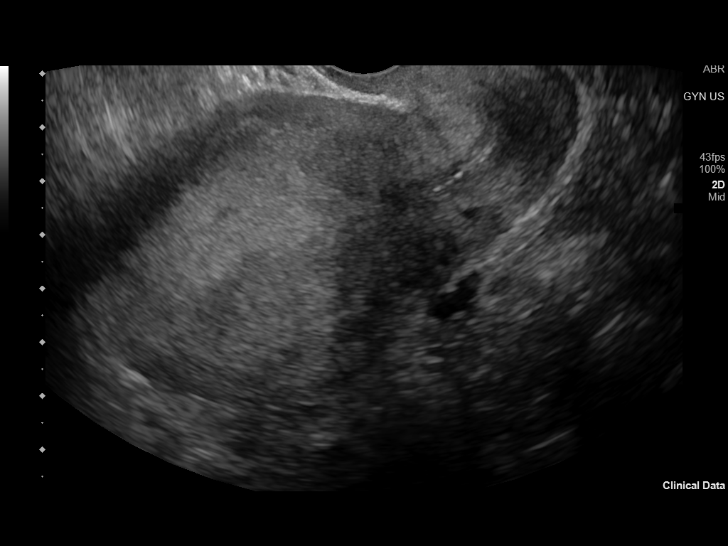
[im 80/128]
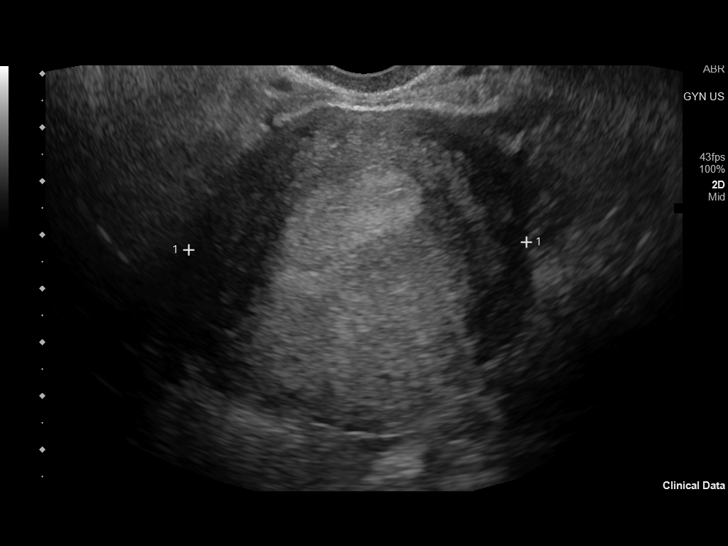
[im 90/128]
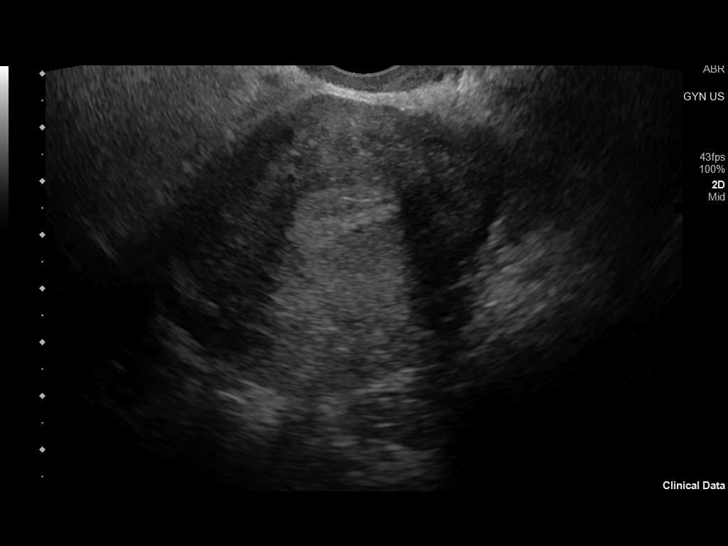
[im 99/128]
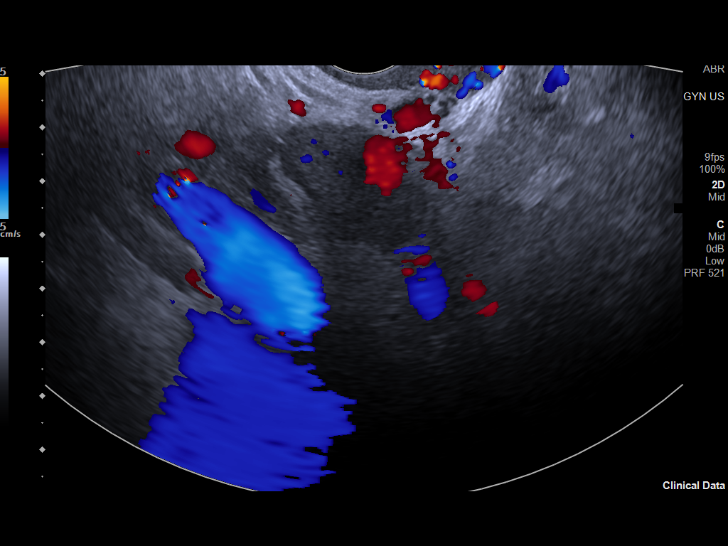
[im 109/128]
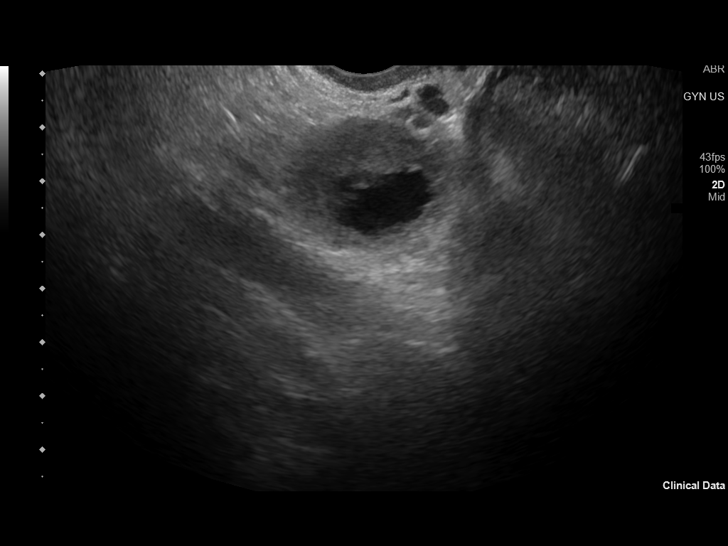
[im 118/128]
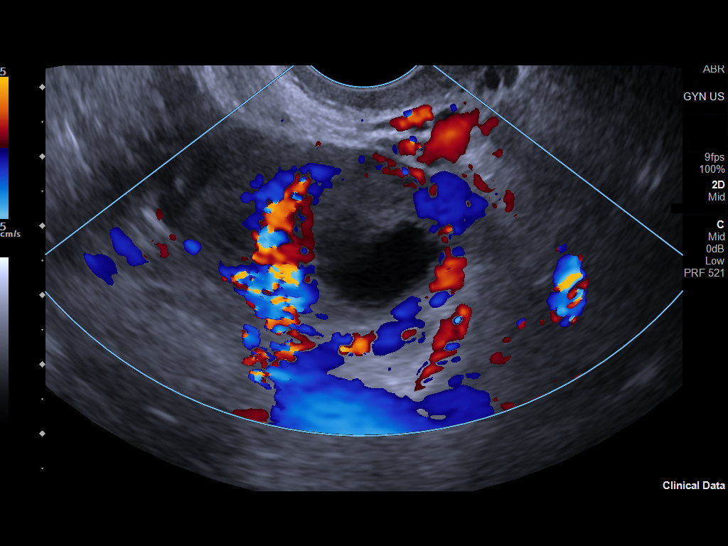
[im 128/128]
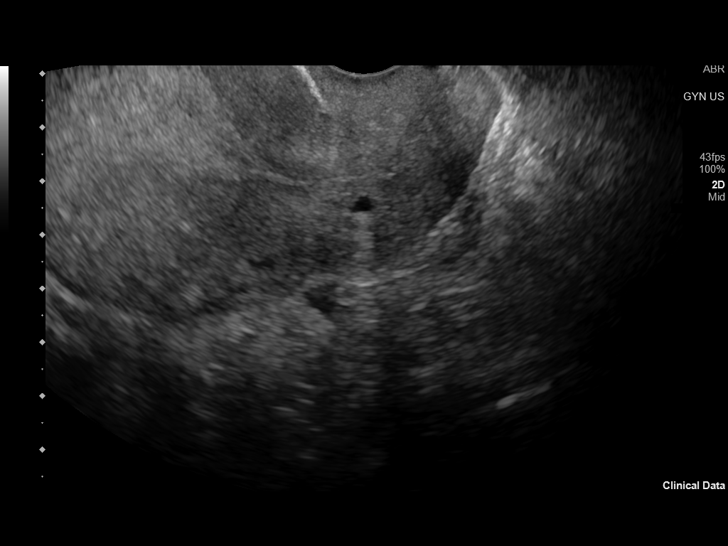

[14 of 28 positions shown; findings below may reference images not displayed]

FINDINGS: Intrauterine gestational sac: None

Yolk sac:  Not Visualized.

Embryo:  Not Visualized.

Maternal uterus/adnexae: The uterus is unremarkable.

No significant ovarian abnormalities are present.

No adnexal mass or free fluid noted.
IMPRESSION: No IUP or adnexal mass visualized. Differential diagnosis includes
recent spontaneous abortion, IUP too early to visualize, and occult
ectopic pregnancy. Recommend close follow up of quantitative B-HCG
levels, and follow up US as clinically warranted.

## 2022-10-17 ENCOUNTER — Telehealth: Payer: Medicaid Other | Admitting: Family Medicine

## 2022-10-17 DIAGNOSIS — N760 Acute vaginitis: Secondary | ICD-10-CM

## 2022-10-17 NOTE — Progress Notes (Signed)
Because less than a month repeat/ return of symptoms, your condition warrants further evaluation and possible testing it is recommend that you be seen in a face to face visit.   NOTE: There will be NO CHARGE for this eVisit

## 2022-11-18 NOTE — Patient Instructions (Signed)
Preventive Care 21-36 Years Old, Female Preventive care refers to lifestyle choices and visits with your health care provider that can promote health and wellness. Preventive care visits are also called wellness exams. What can I expect for my preventive care visit? Counseling During your preventive care visit, your health care provider may ask about your: Medical history, including: Past medical problems. Family medical history. Pregnancy history. Current health, including: Menstrual cycle. Method of birth control. Emotional well-being. Home life and relationship well-being. Sexual activity and sexual health. Lifestyle, including: Alcohol, nicotine or tobacco, and drug use. Access to firearms. Diet, exercise, and sleep habits. Work and work environment. Sunscreen use. Safety issues such as seatbelt and bike helmet use. Physical exam Your health care provider may check your: Height and weight. These may be used to calculate your BMI (body mass index). BMI is a measurement that tells if you are at a healthy weight. Waist circumference. This measures the distance around your waistline. This measurement also tells if you are at a healthy weight and may help predict your risk of certain diseases, such as type 2 diabetes and high blood pressure. Heart rate and blood pressure. Body temperature. Skin for abnormal spots. What immunizations do I need?  Vaccines are usually given at various ages, according to a schedule. Your health care provider will recommend vaccines for you based on your age, medical history, and lifestyle or other factors, such as travel or where you work. What tests do I need? Screening Your health care provider may recommend screening tests for certain conditions. This may include: Pelvic exam and Pap test. Lipid and cholesterol levels. Diabetes screening. This is done by checking your blood sugar (glucose) after you have not eaten for a while (fasting). Hepatitis  B test. Hepatitis C test. HIV (human immunodeficiency virus) test. STI (sexually transmitted infection) testing, if you are at risk. BRCA-related cancer screening. This may be done if you have a family history of breast, ovarian, tubal, or peritoneal cancers. Talk with your health care provider about your test results, treatment options, and if necessary, the need for more tests. Follow these instructions at home: Eating and drinking  Eat a healthy diet that includes fresh fruits and vegetables, whole grains, lean protein, and low-fat dairy products. Take vitamin and mineral supplements as recommended by your health care provider. Do not drink alcohol if: Your health care provider tells you not to drink. You are pregnant, may be pregnant, or are planning to become pregnant. If you drink alcohol: Limit how much you have to 0-1 drink a day. Know how much alcohol is in your drink. In the [place] one drink equals one 12 oz bottle of beer (355 mL), one 5 oz glass of wine (148 mL), or one 1 oz glass of hard liquor (44 mL). Lifestyle Brush your teeth every morning and night with fluoride toothpaste. Floss one time each day. Exercise for at least 30 minutes 5 or more days each week. Do not use any products that contain nicotine or tobacco. These products include cigarettes, chewing tobacco, and vaping devices, such as e-cigarettes. If you need help quitting, ask your health care provider. Do not use drugs. If you are sexually active, practice safe sex. Use a condom or other form of protection to prevent STIs. If you do not wish to become pregnant, use a form of birth control. If you plan to become pregnant, see your health care provider for a prepregnancy visit. Find healthy ways to manage stress, such as: Meditation,   yoga, or listening to music. Journaling. Talking to a trusted person. Spending time with friends and family. Minimize exposure to UV radiation to reduce your risk of skin  cancer. Safety Always wear your seat belt while driving or riding in a vehicle. Do not drive: If you have been drinking alcohol. Do not ride with someone who has been drinking. If you have been using any mind-altering substances or drugs. While texting. When you are tired or distracted. Wear a helmet and other protective equipment during sports activities. If you have firearms in your house, make sure you follow all gun safety procedures. Seek help if you have been physically or sexually abused. What's next? Go to your health care provider once a year for an annual wellness visit. Ask your health care provider how often you should have your eyes and teeth checked. Stay up to date on all vaccines. This information is not intended to replace advice given to you by your health care provider. Make sure you discuss any questions you have with your health care provider. Document Revised: 01/03/2021 Document Reviewed: 01/03/2021 Elsevier Patient Education  2023 Elsevier Inc. Breast Self-Awareness Breast self-awareness is knowing how your breasts look and feel. You need to: Check your breasts on a regular basis. Tell your doctor about any changes. Become familiar with the look and feel of your breasts. This can help you catch a breast problem while it is still small and can be treated. You should do breast self-exams even if you have breast implants. What you need: A mirror. A well-lit room. A pillow or other soft object. How to do a breast self-exam Follow these steps to do a breast self-exam: Look for changes  Take off all the clothes above your waist. Stand in front of a mirror in a room with good lighting. Put your hands down at your sides. Compare your breasts in the mirror. Look for any difference between them, such as: A difference in shape. A difference in size. Wrinkles, dips, and bumps in one breast and not the other. Look at each breast for changes in the skin, such  as: Redness. Scaly areas. Skin that has gotten thicker. Dimpling. Open sores (ulcers). Look for changes in your nipples, such as: Fluid coming out of a nipple. Fluid around a nipple. Bleeding. Dimpling. Redness. A nipple that looks pushed in (retracted), or that has changed position. Feel for changes Lie on your back. Feel each breast. To do this: Pick a breast to feel. Place a pillow under the shoulder closest to that breast. Put the arm closest to that breast behind your head. Feel the nipple area of that breast using the hand of your other arm. Feel the area with the pads of your three middle fingers by making small circles with your fingers. Use light, medium, and firm pressure. Continue the overlapping circles, moving downward over the breast. Keep making circles with your fingers. Stop when you feel your ribs. Start making circles with your fingers again, this time going upward until you reach your collarbone. Then, make circles outward across your breast and into your armpit area. Squeeze your nipple. Check for discharge and lumps. Repeat these steps to check your other breast. Sit or stand in the tub or shower. With soapy water on your skin, feel each breast the same way you did when you were lying down. Write down what you find Writing down what you find can help you remember what to tell your doctor. Write down: What is   normal for each breast. Any changes you find in each breast. These include: The kind of changes you find. A tender or painful breast. Any lump you find. Write down its size and where it is. When you last had your monthly period (menstrual cycle). General tips If you are breastfeeding, the best time to check your breasts is after you feed your baby or after you use a breast pump. If you get monthly bleeding, the best time to check your breasts is 5-7 days after your monthly cycle ends. With time, you will become comfortable with the self-exam. You will  also start to know if there are changes in your breasts. Contact a doctor if: You see a change in the shape or size of your breasts or nipples. You see a change in the skin of your breast or nipples, such as red or scaly skin. You have fluid coming from your nipples that is not normal. You find a new lump or thick area. You have breast pain. You have any concerns about your breast health. Summary Breast self-awareness includes looking for changes in your breasts and feeling for changes within your breasts. You should do breast self-awareness in front of a mirror in a well-lit room. If you get monthly periods (menstrual cycles), the best time to check your breasts is 5-7 days after your period ends. Tell your doctor about any changes you see in your breasts. Changes include changes in size, changes on the skin, painful or tender breasts, or fluid from your nipples that is not normal. This information is not intended to replace advice given to you by your health care provider. Make sure you discuss any questions you have with your health care provider. Document Revised: 12/13/2021 Document Reviewed: 05/10/2021 Elsevier Patient Education  2023 Elsevier Inc.  
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
## 2022-11-19 ENCOUNTER — Encounter: Payer: Self-pay | Admitting: Obstetrics and Gynecology

## 2022-11-19 ENCOUNTER — Ambulatory Visit (INDEPENDENT_AMBULATORY_CARE_PROVIDER_SITE_OTHER): Payer: Medicaid Other | Admitting: Obstetrics and Gynecology

## 2022-11-19 VITALS — BP 117/58 | HR 96 | Ht 62.0 in | Wt 245.0 lb

## 2022-11-19 DIAGNOSIS — Z8742 Personal history of other diseases of the female genital tract: Secondary | ICD-10-CM

## 2022-11-19 DIAGNOSIS — Z13220 Encounter for screening for lipoid disorders: Secondary | ICD-10-CM

## 2022-11-19 DIAGNOSIS — N898 Other specified noninflammatory disorders of vagina: Secondary | ICD-10-CM | POA: Diagnosis not present

## 2022-11-19 DIAGNOSIS — Z124 Encounter for screening for malignant neoplasm of cervix: Secondary | ICD-10-CM | POA: Diagnosis not present

## 2022-11-19 DIAGNOSIS — Z01419 Encounter for gynecological examination (general) (routine) without abnormal findings: Secondary | ICD-10-CM

## 2022-11-19 DIAGNOSIS — R87610 Atypical squamous cells of undetermined significance on cytologic smear of cervix (ASC-US): Secondary | ICD-10-CM

## 2022-11-19 DIAGNOSIS — Z131 Encounter for screening for diabetes mellitus: Secondary | ICD-10-CM | POA: Diagnosis not present

## 2022-11-20 ENCOUNTER — Encounter: Payer: Self-pay | Admitting: Obstetrics and Gynecology

## 2022-11-20 LAB — CBC
Hematocrit: 40.8 % (ref 34.0–46.6)
Hemoglobin: 13.2 g/dL (ref 11.1–15.9)
MCH: 28.4 pg (ref 26.6–33.0)
MCHC: 32.4 g/dL (ref 31.5–35.7)
MCV: 88 fL (ref 79–97)
Platelets: 338 10*3/uL (ref 150–450)
RBC: 4.65 x10E6/uL (ref 3.77–5.28)
RDW: 13.2 % (ref 11.7–15.4)
WBC: 10.3 10*3/uL (ref 3.4–10.8)

## 2022-11-20 LAB — LIPID PANEL
Chol/HDL Ratio: 5.3 ratio — ABNORMAL HIGH (ref 0.0–4.4)
Cholesterol, Total: 229 mg/dL — ABNORMAL HIGH (ref 100–199)
HDL: 43 mg/dL (ref >39)
LDL Chol Calc (NIH): 134 mg/dL — ABNORMAL HIGH (ref 0–99)
Triglycerides: 288 mg/dL — ABNORMAL HIGH (ref 0–149)
VLDL Cholesterol Cal: 52 mg/dL — ABNORMAL HIGH (ref 5–40)

## 2022-11-20 LAB — HEMOGLOBIN A1C
Est. average glucose Bld gHb Est-mCnc: 114 mg/dL
Hgb A1c MFr Bld: 5.6 % (ref 4.8–5.6)

## 2022-11-20 LAB — COMPREHENSIVE METABOLIC PANEL
ALT: 14 IU/L (ref 0–32)
AST: 18 IU/L (ref 0–40)
Albumin/Globulin Ratio: 1.6 (ref 1.2–2.2)
Albumin: 4.2 g/dL (ref 3.9–4.9)
Alkaline Phosphatase: 70 IU/L (ref 44–121)
BUN/Creatinine Ratio: 16 (ref 9–23)
BUN: 9 mg/dL (ref 6–20)
Bilirubin Total: <0.2 mg/dL (ref 0.0–1.2)
CO2: 25 mmol/L (ref 20–29)
Calcium: 9.3 mg/dL (ref 8.7–10.2)
Chloride: 101 mmol/L (ref 96–106)
Creatinine, Ser: 0.56 mg/dL — ABNORMAL LOW (ref 0.57–1.00)
Globulin, Total: 2.7 g/dL (ref 1.5–4.5)
Glucose: 73 mg/dL (ref 70–99)
Potassium: 3.9 mmol/L (ref 3.5–5.2)
Sodium: 138 mmol/L (ref 134–144)
Total Protein: 6.9 g/dL (ref 6.0–8.5)
eGFR: 122 mL/min/{1.73_m2} (ref >59)

## 2022-11-20 LAB — TSH: TSH: 0.883 u[IU]/mL (ref 0.450–4.500)

## 2022-11-21 LAB — NUSWAB BV AND CANDIDA, NAA
Candida albicans, NAA: NEGATIVE
Candida glabrata, NAA: NEGATIVE

## 2022-11-25 MED ORDER — PHENTERMINE HCL 37.5 MG PO CAPS: 37.5000 mg | ORAL_CAPSULE | ORAL | 0 refills | Status: DC

## 2022-11-29 LAB — IGP, COBASHPV16/18
HPV 16: NEGATIVE
HPV 18: NEGATIVE
HPV other hr types: NEGATIVE

## 2022-12-17 IMAGING — US US OB COMP LESS 14 WK
1 series · 14 of 28 positions shown · non-contrast
Comparison: 11/12/2020.

CLINICAL DATA: Pregnant patient with pelvic pain and vaginal
bleeding today. Patient is 13 weeks and 4 days pregnant based on her
last menstrual period. Beta HCG level [DATE].

EXAM:
OBSTETRIC <14 WK ULTRASOUND
TECHNIQUE: Transabdominal ultrasound was performed for evaluation of the
gestation as well as the maternal uterus and adnexal regions.

[Series 1: us ob less than 14 weeks with ob transvaginal · 47 acquisitions, 14 frames shown]
[im 2/47]
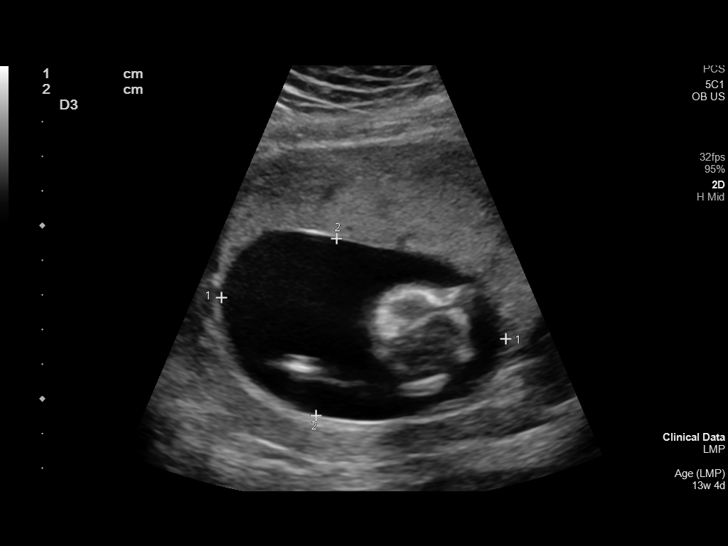
[im 6/47]
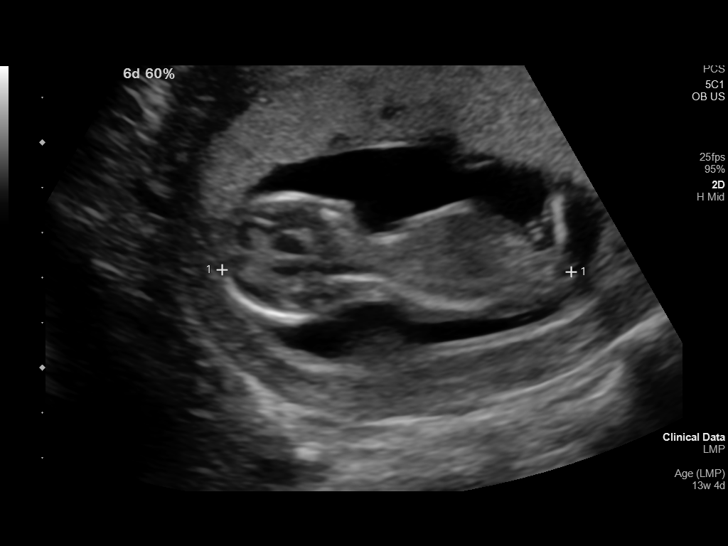
[im 9/47]
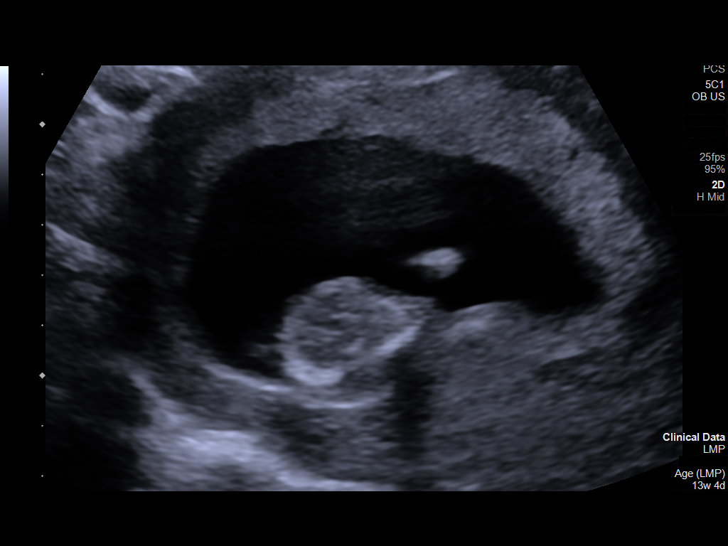
[im 12/47]
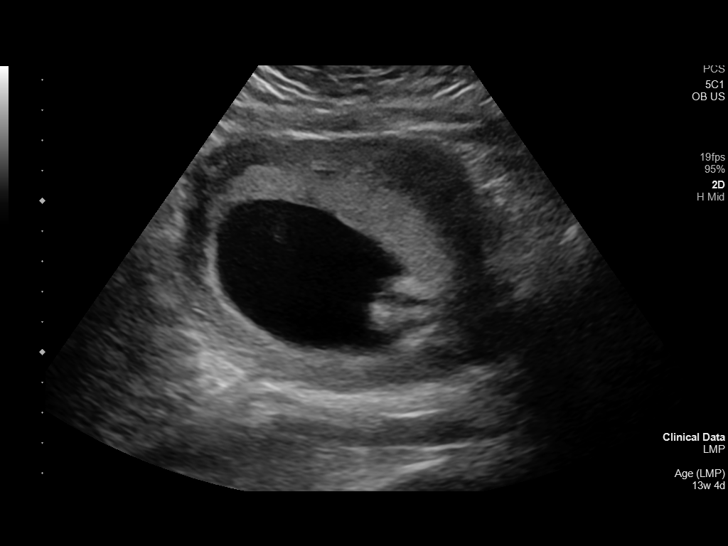
[im 16/47]
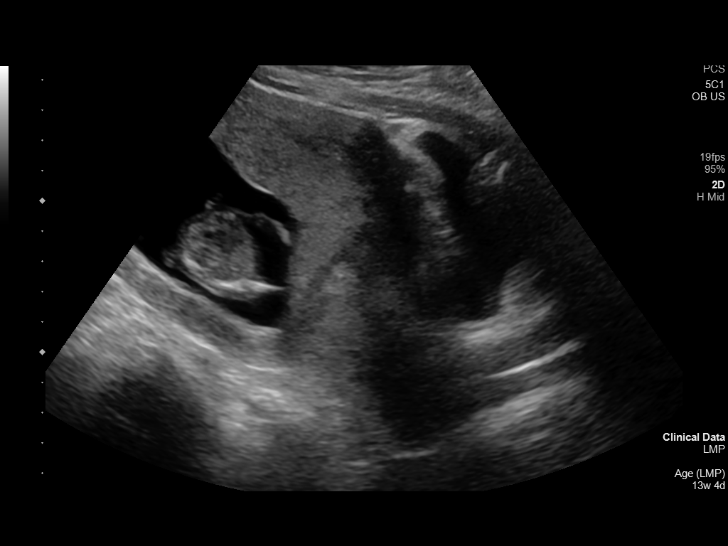
[im 19/47]
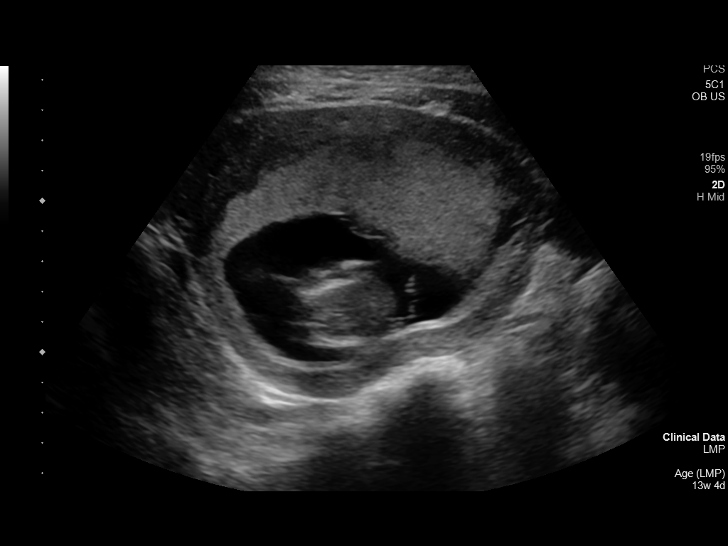
[im 23/47]
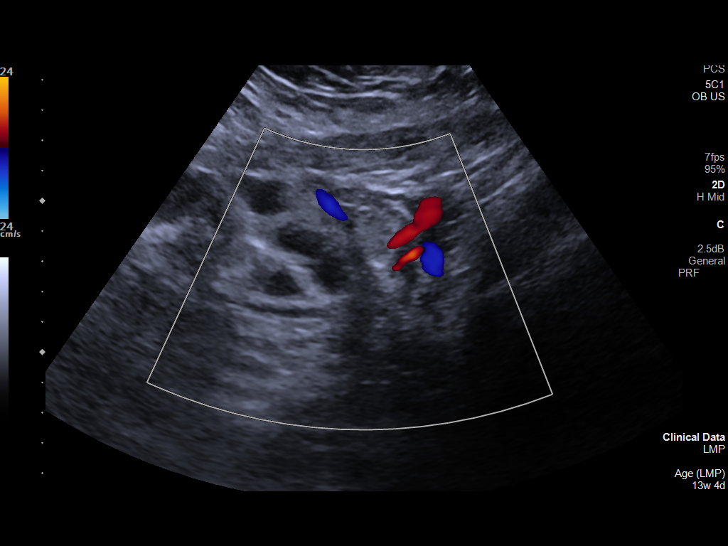
[im 26/47]
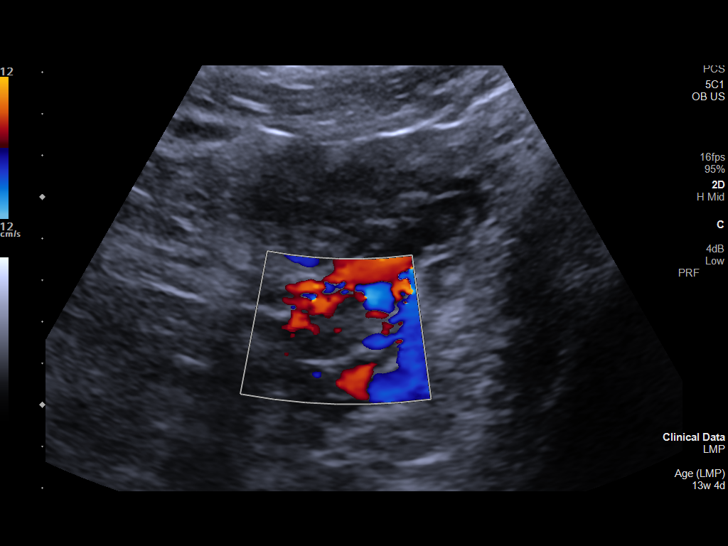
[im 29/47]
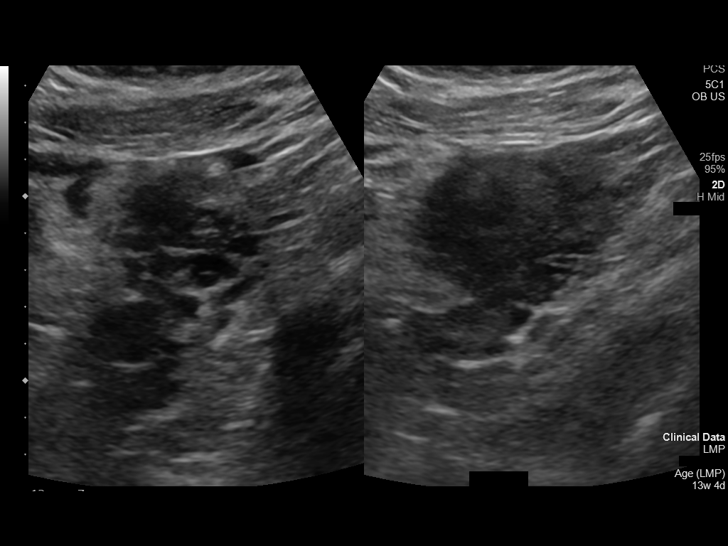
[im 33/47]
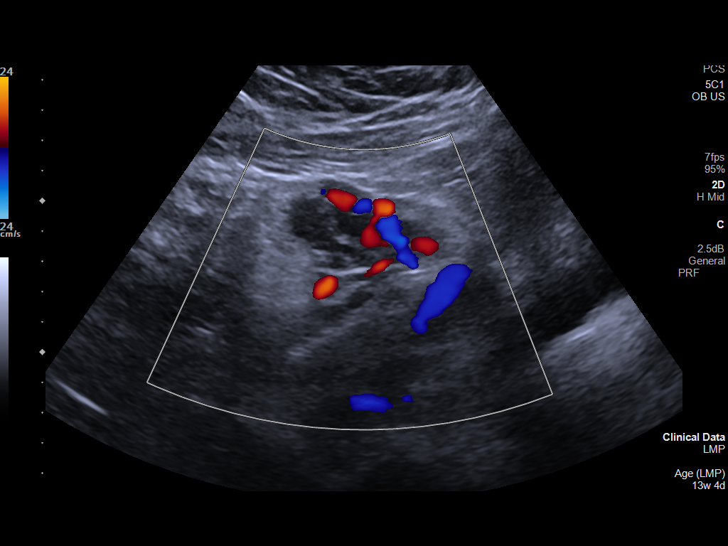
[im 36/47]
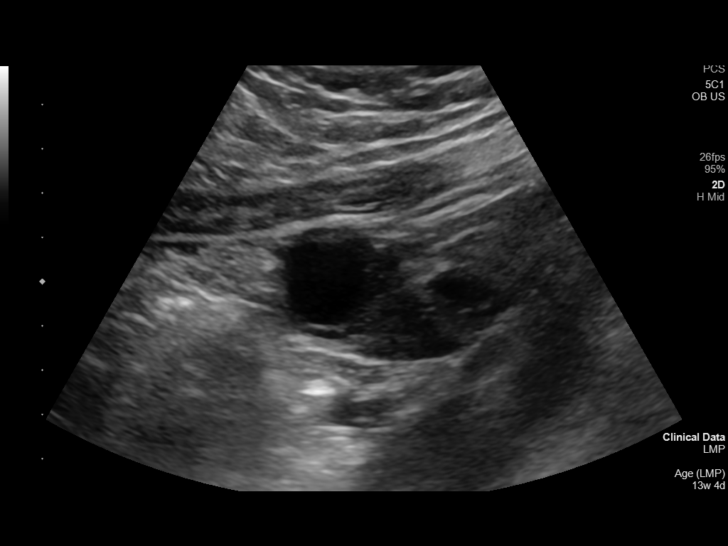
[im 40/47]
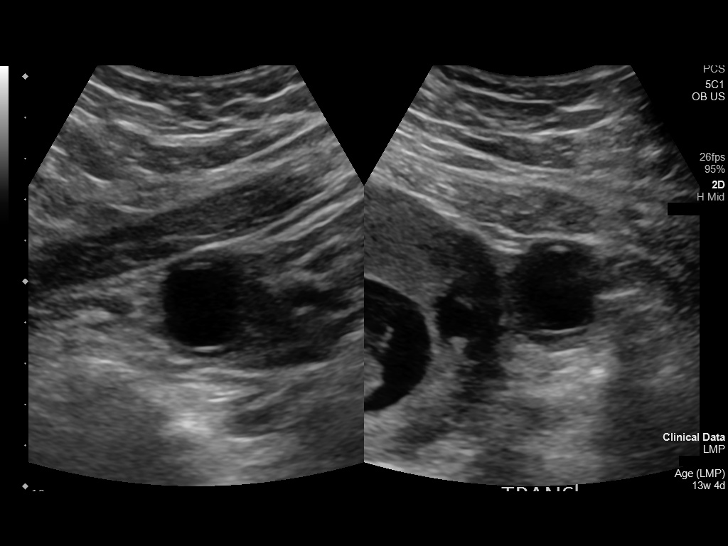
[im 43/47]
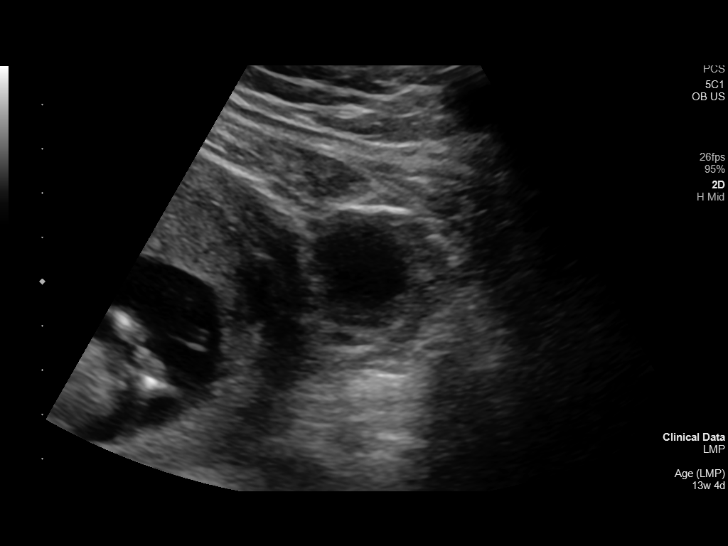
[im 47/47]
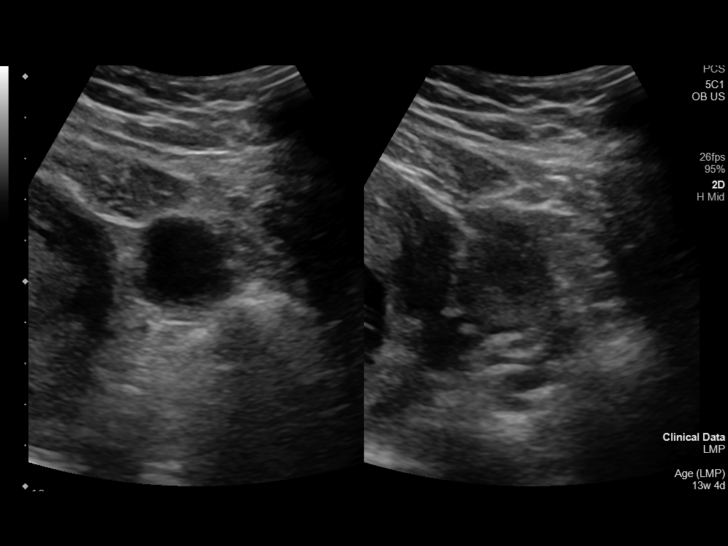

[14 of 28 positions shown; findings below may reference images not displayed]

FINDINGS: Intrauterine gestational sac: Single

Yolk sac:  Not Visualized.

Embryo:  Visualized.

Cardiac Activity: Visualized.

Heart Rate: 153 bpm

CRL:   76.9 mm   13 w 5 d                  US EDC: 06/18/2021

Subchorionic hemorrhage:  None visualized.

Maternal uterus/adnexae: No uterine masses. Cervix is closed.
Ovaries and adnexa are unremarkable. No pelvic free fluid.
IMPRESSION: 1. Single live intrauterine pregnancy with a measured gestational
age of 13 weeks and 5 days, representing normal growth since the
previous Ob ultrasound. No pregnancy complication. Ovaries and
adnexa are unremarkable.

## 2022-12-25 ENCOUNTER — Ambulatory Visit: Payer: Medicaid Other | Admitting: Obstetrics and Gynecology

## 2023-01-21 ENCOUNTER — Encounter: Payer: Self-pay | Admitting: Emergency Medicine

## 2023-01-21 ENCOUNTER — Emergency Department
Admission: EM | Admit: 2023-01-21 | Discharge: 2023-01-22 | Disposition: A | Discharge status: Home / Self Care | Payer: Medicaid Other | Attending: Emergency Medicine | Admitting: Emergency Medicine

## 2023-01-21 ENCOUNTER — Inpatient Hospital Stay: Admission: RE | Admit: 2023-01-21 | Payer: Self-pay | Source: Ambulatory Visit

## 2023-01-21 ENCOUNTER — Other Ambulatory Visit: Payer: Self-pay

## 2023-01-21 DIAGNOSIS — J029 Acute pharyngitis, unspecified: Secondary | ICD-10-CM | POA: Insufficient documentation

## 2023-01-21 DIAGNOSIS — J020 Streptococcal pharyngitis: Secondary | ICD-10-CM | POA: Diagnosis not present

## 2023-01-21 DIAGNOSIS — H9201 Otalgia, right ear: Secondary | ICD-10-CM | POA: Diagnosis present

## 2023-01-21 DIAGNOSIS — H60331 Swimmer's ear, right ear: Secondary | ICD-10-CM | POA: Diagnosis not present

## 2023-01-21 DIAGNOSIS — H6991 Unspecified Eustachian tube disorder, right ear: Secondary | ICD-10-CM | POA: Diagnosis not present

## 2023-01-21 DIAGNOSIS — Z1152 Encounter for screening for COVID-19: Secondary | ICD-10-CM | POA: Insufficient documentation

## 2023-01-21 LAB — SARS CORONAVIRUS 2 BY RT PCR: SARS Coronavirus 2 by RT PCR: NEGATIVE

## 2023-01-21 LAB — GROUP A STREP BY PCR: Group A Strep by PCR: NOT DETECTED

## 2023-01-21 MED ORDER — LIDOCAINE VISCOUS HCL 2 % MT SOLN
15.0000 mL | Freq: Once | OROMUCOSAL | Status: AC
  Administered 2023-01-21: 15 mL via OROMUCOSAL
  Filled 2023-01-21: qty 15

## 2023-01-21 MED ORDER — CIPROFLOXACIN-DEXAMETHASONE 0.3-0.1 % OT SUSP: 4.0000 [drp] | Freq: Two times a day (BID) | OTIC | 0 refills | Status: DC

## 2023-01-21 MED ORDER — DEXAMETHASONE SODIUM PHOSPHATE 10 MG/ML IJ SOLN
10.0000 mg | Freq: Once | INTRAMUSCULAR | Status: AC
  Administered 2023-01-21: 10 mg via INTRAMUSCULAR
  Filled 2023-01-21: qty 1

## 2023-01-21 MED ORDER — AZITHROMYCIN 250 MG PO TABS: ORAL_TABLET | ORAL | 0 refills | Status: DC

## 2023-01-21 MED ORDER — ONDANSETRON 4 MG PO TBDP
4.0000 mg | ORAL_TABLET | Freq: Once | ORAL | Status: AC
  Administered 2023-01-21: 4 mg via ORAL
  Filled 2023-01-21: qty 1

## 2023-01-21 NOTE — ED Triage Notes (Signed)
Pt c/o sore throat x 3 days, dry cough x 2 days and right ear pain x 1 day. No SOB.

## 2023-01-21 NOTE — ED Provider Notes (Signed)
[facility] Emergency Department Provider Note     Event Date/Time   First MD Initiated Contact with Patient 01/21/23 2205     (approximate)   History   Sore Throat   HPI  Mariah Richardson is a 37 y.o. female presents herself to the ED for evaluation of sore throat for the last 3 days.  Patient also reports 2 days of dry intermittent cough as well as some right ear pain and discomfort the last day.  She also endorses some measured fevers last week.  No pain or shortness of breath is reported.  Physical Exam   Triage Vital Signs: ED Triage Vitals  Enc Vitals Group     BP 01/21/23 2149 (!) 143/95     Pulse Rate 01/21/23 2149 94     Resp 01/21/23 2149 18     Temp 01/21/23 2149 98.7 F (37.1 C)     Temp Source 01/21/23 2149 Oral     SpO2 01/21/23 2149 94 %     Weight 01/21/23 2152 230 lb (104.3 kg)     Height 01/21/23 2152 5\' 1"  (1.549 m)     Head Circumference --      Peak Flow --      Pain Score 01/21/23 2152 5     Pain Loc --      Pain Edu? --      Excl. in GC? --     Most recent vital signs: Vitals:   01/21/23 2149 01/21/23 2326  BP: (!) 143/95 131/81  Pulse: 94 68  Resp: 18 15  Temp: 98.7 F (37.1 C)   SpO2: 94% 99%    General Awake, no distress. NAD HEENT NCAT. PERRL. EOMI. No rhinorrhea. Mucous membranes are moist.  Uvula is midline and tonsils are flat.  No oropharyngeal lesions appreciated.  The right TM with some distal canal erythema and edema.  The TM on the right is injected with a serous effusion appreciated.  Tenderness palpation along the right eustachian tube. CV:  Good peripheral perfusion.  RESP:  Normal effort.  ABD:  No distention.    ED Results / Procedures / Treatments   Labs (all labs ordered are listed, but only abnormal results are displayed) Labs Reviewed  GROUP A STREP BY PCR  SARS CORONAVIRUS 2 BY RT PCR    EKG   RADIOLOGY   No results found.   PROCEDURES:  Critical Care performed:  No  Procedures   MEDICATIONS ORDERED IN ED: Medications  dexamethasone (DECADRON) injection 10 mg (10 mg Intramuscular Given 01/21/23 2322)  ondansetron (ZOFRAN-ODT) disintegrating tablet 4 mg (4 mg Oral Given 01/21/23 2326)  lidocaine (XYLOCAINE) 2 % viscous mouth solution 15 mL (15 mLs Mouth/Throat Given 01/21/23 2325)     IMPRESSION / MDM / ASSESSMENT AND PLAN / ED COURSE  I reviewed the triage vital signs and the nursing notes.                              Differential diagnosis includes, but is not limited to, COVID, flu, RSV, AOM, strep pharyngitis, viral URI  Patient's presentation is most consistent with acute complicated illness / injury requiring diagnostic workup.  Patient's diagnosis is consistent with right otitis externa with evidence of a right serous effusion eustachian tube dysfunction.  Patient with reassuring exam overall without any signs of acute respiratory distress or dehydration.  Her COVID test is negative and her strep  PCR is also negative.  Patient will be discharged home with prescriptions for Ciprodex and azithromycin.  Patient given a dose of Decadron in the ED to help with her tonsillitis as well as viscous lidocaine and Zofran for symptom relief.  Patient is to follow up with primary provider as needed or otherwise directed. Patient is given ED precautions to return to the ED for any worsening or new symptoms.  FINAL CLINICAL IMPRESSION(S) / ED DIAGNOSES   Final diagnoses:  Acute swimmer's ear of right side  Sore throat  Eustachian tube dysfunction, right     Rx / DC Orders   ED Discharge Orders          Ordered    azithromycin (ZITHROMAX Z-PAK) 250 MG tablet        01/21/23 2249    ciprofloxacin-dexamethasone (CIPRODEX) OTIC suspension  2 times daily        01/21/23 2249             Note:  This document was prepared using Dragon voice recognition software and may include unintentional dictation errors.    Lissa Hoard,  PA-C 01/21/23 2359    Corena Herter, MD 01/22/23 [phone]

## 2023-01-21 NOTE — Discharge Instructions (Signed)
Take the prescription meds as directed.  Follow-up with your primary provider for ongoing concerns.  Return to the ED if needed.

## 2023-01-24 ENCOUNTER — Telehealth: Payer: Medicaid Other | Admitting: Nurse Practitioner

## 2023-01-24 DIAGNOSIS — B3731 Acute candidiasis of vulva and vagina: Secondary | ICD-10-CM

## 2023-01-24 MED ORDER — FLUCONAZOLE 150 MG PO TABS
150.00 mg | ORAL_TABLET | Freq: Once | ORAL | 0 refills | Status: AC
Start: 2023-01-24 — End: 2023-01-24

## 2023-01-24 NOTE — Progress Notes (Signed)

## 2023-01-27 ENCOUNTER — Ambulatory Visit (INDEPENDENT_AMBULATORY_CARE_PROVIDER_SITE_OTHER): Payer: Medicaid Other

## 2023-01-27 ENCOUNTER — Other Ambulatory Visit (HOSPITAL_COMMUNITY)
Admission: RE | Admit: 2023-01-27 | Discharge: 2023-01-27 | Disposition: A | Discharge status: Home / Self Care | Payer: Medicaid Other | Source: Ambulatory Visit | Attending: Obstetrics and Gynecology | Admitting: Obstetrics and Gynecology

## 2023-01-27 VITALS — BP 132/85 | HR 89 | Ht 61.0 in | Wt 229.0 lb

## 2023-01-27 DIAGNOSIS — N898 Other specified noninflammatory disorders of vagina: Secondary | ICD-10-CM

## 2023-01-27 NOTE — Progress Notes (Signed)
    NURSE VISIT NOTE  Subjective:    Patient ID: Mariah Richardson, female    DOB: 1985-12-21, 37 y.o.   MRN: [id number]  HPI  Patient is a 37 y.o. [id number] female who presents for  vaginal itching for a couple day(s). Denies abnormal vaginal bleeding or significant pelvic pain or fever. denies genital irritation. Patient denies history of known exposure to STD.   Objective:    BP 132/85   Pulse 89   Ht 5\' 1"  (1.549 m)   Wt 229 lb (103.9 kg)   LMP 01/08/2023   Breastfeeding No   BMI 43.27 kg/m     Assessment:   1. Vaginal irritation       Plan:   GC and chlamydia DNA  probe sent to lab. Treatment: abstain from coitus during course of treatment ROV prn if symptoms persist or worsen.   Loney Laurence, CMA

## 2023-01-28 ENCOUNTER — Telehealth: Payer: Medicaid Other | Admitting: Physician Assistant

## 2023-01-28 ENCOUNTER — Encounter: Payer: Self-pay | Admitting: Obstetrics and Gynecology

## 2023-01-28 ENCOUNTER — Other Ambulatory Visit: Payer: Self-pay

## 2023-01-28 DIAGNOSIS — B379 Candidiasis, unspecified: Secondary | ICD-10-CM

## 2023-01-28 DIAGNOSIS — B3731 Acute candidiasis of vulva and vagina: Secondary | ICD-10-CM

## 2023-01-28 LAB — CERVICOVAGINAL ANCILLARY ONLY
Bacterial Vaginitis (gardnerella): NEGATIVE
Candida Glabrata: POSITIVE — AB
Candida Vaginitis: POSITIVE — AB
Chlamydia: NEGATIVE
Comment: NEGATIVE
Comment: NEGATIVE
Comment: NEGATIVE
Comment: NEGATIVE
Comment: NEGATIVE
Comment: NORMAL
Neisseria Gonorrhea: NEGATIVE
Trichomonas: NEGATIVE

## 2023-01-28 MED ORDER — FLUCONAZOLE 150 MG PO TABS: 150.0000 mg | ORAL_TABLET | ORAL | 0 refills | Status: DC

## 2023-01-28 MED ORDER — FLUCONAZOLE 150 MG PO TABS
150.0000 mg | ORAL_TABLET | ORAL | 2 refills | Status: DC
Start: 2023-01-28 — End: 2023-07-01

## 2023-01-28 NOTE — Progress Notes (Signed)
Erroneous. Provider patient saw yesterday has already sent medication. Marked no charge.

## 2023-01-28 NOTE — Progress Notes (Signed)
Dx = yeast - needs Rx: Diflucan 150mg   1 PO now  then q 3 days X2 Thanks

## 2023-01-28 NOTE — Progress Notes (Signed)
Her Nuswab is consistent with yeast.  Please give her Diflucan.  150 mg 1 now and then every 3 days x 2 additional doses. Thank you

## 2023-01-29 ENCOUNTER — Telehealth: Payer: Self-pay

## 2023-01-29 NOTE — Telephone Encounter (Signed)
Pt calling; is trying to be a serogut for her best friend; needs STI checked.  Has had the swab done yesterday; needs the blood work - HIV, RPR, and Hepatitis.

## 2023-01-31 ENCOUNTER — Other Ambulatory Visit: Payer: Self-pay

## 2023-01-31 DIAGNOSIS — N898 Other specified noninflammatory disorders of vagina: Secondary | ICD-10-CM

## 2023-01-31 NOTE — Telephone Encounter (Signed)
I contacted the patient via phone. She is scheduled for 7/15 for labs. The patient has confirmed this appointment

## 2023-02-03 ENCOUNTER — Other Ambulatory Visit: Payer: Medicaid Other

## 2023-02-03 DIAGNOSIS — N898 Other specified noninflammatory disorders of vagina: Secondary | ICD-10-CM

## 2023-02-05 LAB — HEPATITIS B SURFACE ANTIGEN: Hepatitis B Surface Ag: NEGATIVE

## 2023-02-05 LAB — RPR: RPR Ser Ql: REACTIVE — AB

## 2023-02-05 LAB — RPR, QUANT+TP ABS (REFLEX)
Rapid Plasma Reagin, Quant: 1:2 {titer} — ABNORMAL HIGH
T Pallidum Abs: NONREACTIVE

## 2023-02-05 LAB — HIV-1/HIV-2 QUALITATIVE RNA
HIV-1 RNA, Qualitative: NONREACTIVE
HIV-2 RNA, Qualitative: NONREACTIVE

## 2023-02-10 ENCOUNTER — Other Ambulatory Visit: Payer: Self-pay | Admitting: Obstetrics and Gynecology

## 2023-02-18 ENCOUNTER — Inpatient Hospital Stay: Admission: RE | Admit: 2023-02-18 | Payer: Medicaid Other | Source: Ambulatory Visit

## 2023-05-29 ENCOUNTER — Other Ambulatory Visit (HOSPITAL_COMMUNITY)
Admission: RE | Admit: 2023-05-29 | Discharge: 2023-05-29 | Disposition: A | Discharge status: Home / Self Care | Payer: Medicaid Other | Source: Ambulatory Visit | Attending: Obstetrics | Admitting: Obstetrics

## 2023-05-29 ENCOUNTER — Encounter: Payer: Self-pay | Admitting: Obstetrics

## 2023-05-29 ENCOUNTER — Ambulatory Visit (INDEPENDENT_AMBULATORY_CARE_PROVIDER_SITE_OTHER): Payer: Medicaid Other | Admitting: Obstetrics

## 2023-05-29 VITALS — BP 129/59 | HR 85 | Ht 62.0 in | Wt 239.0 lb

## 2023-05-29 DIAGNOSIS — R768 Other specified abnormal immunological findings in serum: Secondary | ICD-10-CM

## 2023-05-29 DIAGNOSIS — Z1159 Encounter for screening for other viral diseases: Secondary | ICD-10-CM

## 2023-05-29 DIAGNOSIS — Z124 Encounter for screening for malignant neoplasm of cervix: Secondary | ICD-10-CM

## 2023-05-29 DIAGNOSIS — Z6841 Body Mass Index (BMI) 40.0 and over, adult: Secondary | ICD-10-CM

## 2023-05-29 DIAGNOSIS — Z113 Encounter for screening for infections with a predominantly sexual mode of transmission: Secondary | ICD-10-CM | POA: Insufficient documentation

## 2023-05-29 DIAGNOSIS — Z01419 Encounter for gynecological examination (general) (routine) without abnormal findings: Secondary | ICD-10-CM

## 2023-05-29 DIAGNOSIS — Z131 Encounter for screening for diabetes mellitus: Secondary | ICD-10-CM | POA: Diagnosis not present

## 2023-05-29 DIAGNOSIS — Z13220 Encounter for screening for lipoid disorders: Secondary | ICD-10-CM

## 2023-05-29 DIAGNOSIS — Z23 Encounter for immunization: Secondary | ICD-10-CM | POA: Diagnosis not present

## 2023-05-29 NOTE — Progress Notes (Signed)
GYNECOLOGY: ANNUAL EXAM   Subjective:    PCP: [facility] [employer] [facility] Mariah Richardson is a 37 y.o. female [phone] who presents for annual wellness visit.   Well Woman Visit:  GYN HISTORY:  Patient's last menstrual period was 05/14/2023.     Menstrual History: OB History     Gravida  5   Para  4   Term  4   Preterm      AB  1   Living  4      SAB      IAB      Ectopic      Multiple  0   Live Births  40           Menarche age: 40 Patient's last menstrual period was 05/14/2023. Period Cycle (Days): 30 Period Duration (Days): 8 Period Pattern: Regular Menstrual Flow: Heavy Menstrual Control: Tampon, Thin pad, Maxi pad Dysmenorrhea: (!) Severe Dysmenorrhea Symptoms: Cramping   Uses pads / tampons and changes it every 3-4 hours.  Cramping is  severe.  Cyclic symptoms include: bloating, breast tenderness, diarrhea, headache, irritability, moodiness, pelvic pain, and weight gain.  Intermenstrual bleeding, spotting, or discharge? no Urinary incontinence? no  Sexually active: Yes  Number of sexual partners: 1 Gender of sexual Partners: Male Social History   Substance and Sexual Activity  Sexual Activity Yes   Birth control/protection: None  Dyspareunia? No  STI history: No  STI/HIV testing or immunizations needed? Yes.   Requesting full panel minus HSV. 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  Health Maintenance: -Last pap: was normal 11/19/22 --> Any abnormals: Yes 07/31/21 -- ASCUS with HR-HPV+, subsequent colpo NEG -Last mammogram: n/a --> Any abnormals? N/a -Last colon cancer screen: n/a / Type: na -Last DEXA scan: n/a [facility] of Breast / Colon / Cervical cancer: No  -Vaccines:  Immunization History  Administered Date(s) Administered   Influenza-Unspecified 08/23/2020, 09/19/2020   Moderna SARS-COV2 Booster Vaccination 07/03/2020   Moderna Sars-Covid-2 Vaccination 12/30/2019, 01/27/2020    Tdap 03/27/2021   Last Tdap: UTD / Flu: requesting / COVID: completed / Gardasil: none, requesting / Shingles (50+): n/a / PCV20: n/a -Hep C screen: needs -Last lipid / glucose screening: needs  > Exercise: none, moderately active > Dietary Supplements: Folate: No;  Calcium: No}; Vitamin D: No > Body mass index is 43.71 kg/m.  > Recent dental visit No. > Seat Belt Use: No. > Texting and driving? No. > Guns in the house Yes.   > Recreational or other drug use: denied.   Social History   Tobacco Use   Smoking status: Never   Smokeless tobacco: Never  Substance Use Topics   Alcohol use: No   Occupation: [employer]    Lives with: kids&parents     PHQ-2 Score: In last two weeks, how often have you felt: Little interest or pleasure in doing things: Not at all (0) Feeling down, depressed or hopeless: Not at all (0) Score: 0  GAD-2 Over the last 2 weeks, how often have you been bothered by the following problems? Feeling nervous, anxious or on edge: Not at all (0) Not being able to stop or control worrying: Several days (+1)} Score: 1 _________________________________________________________  Current Outpatient Medications  Medication Sig Dispense Refill   azithromycin (ZITHROMAX Z-PAK) 250 MG tablet Take 2 tablets (500 mg) on  Day 1,  followed by 1 tablet (250 mg) once daily on Days 2 through 5. (Patient not taking:  Reported on 05/29/2023) 6 each 0   ciprofloxacin-dexamethasone (CIPRODEX) OTIC suspension Place 4 drops into the right ear 2 (two) times daily. (Patient not taking: Reported on 05/29/2023) 7.5 mL 0   fluconazole (DIFLUCAN) 150 MG tablet Take 1 tablet (150 mg total) by mouth every 3 (three) days. For three doses (Patient not taking: Reported on 05/29/2023) 1 tablet 2   fluconazole (DIFLUCAN) 150 MG tablet Take 1 tablet (150 mg total) by mouth every 3 (three) days. For three doses (Patient not taking: Reported on 05/29/2023) 3 tablet 0   phentermine 37.5 MG capsule Take 1  capsule (37.5 mg total) by mouth every morning. (Patient not taking: Reported on 05/29/2023) 30 capsule 0   No current facility-administered medications for this visit.   Allergies  Allergen Reactions   Amoxicillin Hives   Latex    Penicillins Hives    Past Medical History:  Diagnosis Date   Gastritis    Hypertension    Past Surgical History:  Procedure Laterality Date   APPENDECTOMY     CHOLECYSTECTOMY      Review Of Systems  Constitutional: Denied constitutional symptoms, night sweats, recent illness, fatigue, fever, insomnia and weight loss.  Eyes: Denied eye symptoms, eye pain, photophobia, vision change and visual disturbance.  Ears/Nose/Throat/Neck: Denied ear, nose, throat or neck symptoms, hearing loss, nasal discharge, sinus congestion and sore throat.  Cardiovascular: Denied cardiovascular symptoms, arrhythmia, chest pain/pressure, edema, exercise intolerance, orthopnea and palpitations.  Respiratory: Denied pulmonary symptoms, asthma, pleuritic pain, productive sputum, cough, dyspnea and wheezing.  Gastrointestinal: Denied, gastro-esophageal reflux, melena, nausea and vomiting.  Genitourinary: Denied genitourinary symptoms including symptomatic vaginal discharge, pelvic relaxation issues, and urinary complaints.  Musculoskeletal: Denied musculoskeletal symptoms, stiffness, swelling, muscle weakness and myalgia.  Dermatologic: Denied dermatology symptoms, rash and scar.  Neurologic: Denied neurology symptoms, dizziness, headache, neck pain and syncope.  Psychiatric: Denied psychiatric symptoms, anxiety and depression.  Endocrine: Denied endocrine symptoms including hot flashes and night sweats.      Objective:    BP (!) 129/59   Pulse 85   Ht 5\' 2"  (1.575 m)   Wt 239 lb (108.4 kg)   LMP 05/14/2023   BMI 43.71 kg/m   Constitutional: Well-developed, well-nourished female in no acute distress Neurological: Alert and oriented to person, place, and  time Psychiatric: Mood and affect appropriate Skin: No rashes or lesions, hyperpigmentation around neck collar Neck: Supple without masses. Trachea is midline.Thyroid is normal size without masses Lymphatics: No cervical, axillary, supraclavicular, or inguinal adenopathy noted Respiratory: Clear to auscultation bilaterally. Good air movement with normal work of breathing. Cardiovascular: Regular rate and rhythm. Extremities grossly normal, nontender with no edema; pulses regular Gastrointestinal: Soft, nontender, nondistended. No masses or hernias appreciated. No hepatosplenomegaly. No fluid wave. No rebound or guarding. Breast Exam: normal appearance, no masses or tenderness, Inspection negative, No nipple retraction or dimpling, No nipple discharge or bleeding, No axillary or supraclavicular adenopathy, Normal to palpation without dominant masses Genitourinary:         External Genitalia: Normal female genitalia    Vagina: Normal mucosa, no lesions.    Cervix: No lesions, normal size and consistency; no cervical motion tenderness; non-friable; Pap not obtained.    Uterus: Normal size and contour; smooth, mobile, NT, retroverted. Adnexae: Non-palpable and non-tender Perineum/Anus: No lesions Rectal: deferred    Assessment/Plan:    ELIZABTH PALKA is a 37 y.o. female [phone] with normal well-woman gynecologic exam.  -Screenings:  Pap: based on prior pathology, [facility] recs repeating co-testing  April 2027.  Mammogram: screen at 36yo Colon: screen at 36yo Labs: A1C, HepC, Lipid panel, Vit D, TSH / STI panel GAD/PHQ-2 = 1 -Contraception: none, declines -Vaccines: Flu and Gardasil #1 today.  -Healthy lifestyle modifications discussed: multivitamin, diet, exercise, sunscreen, tobacco and alcohol use. Emphasized importance of regular physical activity.  -Folate recommendation reviewed.  -All questions answered to patient's satisfaction.  -RTC 1 yr for annual, sooner prn.   Return in  about 1 year (around 05/28/2024) for Annual.    Julieanne Manson, DO [facility] at [facility]

## 2023-05-29 NOTE — Addendum Note (Signed)
Addended by: Julieanne Manson on: 05/29/2023 09:16 AM   Modules accepted: Orders

## 2023-05-29 NOTE — Addendum Note (Signed)
Addended by: Cornelius Moras D on: 05/29/2023 09:02 AM   Modules accepted: Orders

## 2023-05-30 LAB — RPR, QUANT. (REFLEX): Rapid Plasma Reagin, Quant: 1:1 {titer} — ABNORMAL HIGH

## 2023-05-30 LAB — LIPID PANEL
Chol/HDL Ratio: 5 ratio — ABNORMAL HIGH (ref 0.0–4.4)
Cholesterol, Total: 218 mg/dL — ABNORMAL HIGH (ref 100–199)
HDL: 44 mg/dL (ref >39)
LDL Chol Calc (NIH): 143 mg/dL — ABNORMAL HIGH (ref 0–99)
Triglycerides: 169 mg/dL — ABNORMAL HIGH (ref 0–149)
VLDL Cholesterol Cal: 31 mg/dL (ref 5–40)

## 2023-05-30 LAB — VITAMIN D 25 HYDROXY (VIT D DEFICIENCY, FRACTURES): Vit D, 25-Hydroxy: 10.2 ng/mL — ABNORMAL LOW (ref 30.0–100.0)

## 2023-05-30 LAB — CERVICOVAGINAL ANCILLARY ONLY
Chlamydia: NEGATIVE
Comment: NEGATIVE
Comment: NEGATIVE
Comment: NORMAL
Neisseria Gonorrhea: NEGATIVE
Trichomonas: NEGATIVE

## 2023-05-30 LAB — HEP, RPR, HIV PANEL
HIV Screen 4th Generation wRfx: NONREACTIVE
Hepatitis B Surface Ag: NEGATIVE
RPR Ser Ql: REACTIVE — AB

## 2023-05-30 LAB — HEPATITIS C ANTIBODY: Hep C Virus Ab: NONREACTIVE

## 2023-05-30 LAB — HEMOGLOBIN A1C
Est. average glucose Bld gHb Est-mCnc: 117 mg/dL
Hgb A1c MFr Bld: 5.7 % — ABNORMAL HIGH (ref 4.8–5.6)

## 2023-05-30 LAB — TSH: TSH: 0.881 u[IU]/mL (ref 0.450–4.500)

## 2023-06-02 NOTE — Addendum Note (Signed)
Addended by: Julieanne Manson on: 06/02/2023 08:28 AM   Modules accepted: Orders

## 2023-06-24 LAB — RPR: RPR Ser Ql: REACTIVE — AB

## 2023-06-24 LAB — RPR, QUANT+TP ABS (REFLEX)
Rapid Plasma Reagin, Quant: 1:2 {titer} — ABNORMAL HIGH
T Pallidum Abs: NONREACTIVE

## 2023-06-24 LAB — SPECIMEN STATUS REPORT

## 2023-06-26 DIAGNOSIS — R7303 Prediabetes: Secondary | ICD-10-CM | POA: Diagnosis not present

## 2023-06-26 DIAGNOSIS — Z6841 Body Mass Index (BMI) 40.0 and over, adult: Secondary | ICD-10-CM | POA: Diagnosis not present

## 2023-06-26 DIAGNOSIS — A530 Latent syphilis, unspecified as early or late: Secondary | ICD-10-CM | POA: Diagnosis not present

## 2023-06-26 DIAGNOSIS — E66813 Obesity, class 3: Secondary | ICD-10-CM | POA: Diagnosis not present

## 2023-06-26 DIAGNOSIS — E782 Mixed hyperlipidemia: Secondary | ICD-10-CM | POA: Diagnosis not present

## 2023-06-28 DIAGNOSIS — E782 Mixed hyperlipidemia: Secondary | ICD-10-CM | POA: Insufficient documentation

## 2023-06-28 DIAGNOSIS — R7303 Prediabetes: Secondary | ICD-10-CM | POA: Insufficient documentation

## 2023-07-01 ENCOUNTER — Telehealth: Payer: Medicaid Other | Admitting: Physician Assistant

## 2023-07-01 DIAGNOSIS — R062 Wheezing: Secondary | ICD-10-CM

## 2023-07-01 DIAGNOSIS — B9689 Other specified bacterial agents as the cause of diseases classified elsewhere: Secondary | ICD-10-CM

## 2023-07-01 DIAGNOSIS — J019 Acute sinusitis, unspecified: Secondary | ICD-10-CM

## 2023-07-01 MED ORDER — BENZONATATE 100 MG PO CAPS: 100.0000 mg | ORAL_CAPSULE | Freq: Three times a day (TID) | ORAL | 0 refills | Status: DC | PRN

## 2023-07-01 MED ORDER — DOXYCYCLINE HYCLATE 100 MG PO TABS: 100.0000 mg | ORAL_TABLET | Freq: Two times a day (BID) | ORAL | 0 refills | Status: DC

## 2023-07-01 MED ORDER — PREDNISONE 20 MG PO TABS: 40.0000 mg | ORAL_TABLET | Freq: Every day | ORAL | 0 refills | Status: DC

## 2023-07-01 NOTE — Progress Notes (Signed)
Virtual Visit Consent   Mariah Richardson, you are scheduled for a virtual visit with a [facility] provider today. Just as with appointments in the office, your consent must be obtained to participate. Your consent will be active for this visit and any virtual visit you may have with one of our providers in the next 365 days. If you have a MyChart account, a copy of this consent can be sent to you electronically.  As this is a virtual visit, video technology does not allow for your provider to perform a traditional examination. This may limit your provider's ability to fully assess your condition. If your provider identifies any concerns that need to be evaluated in person or the need to arrange testing (such as labs, EKG, etc.), we will make arrangements to do so. Although advances in technology are sophisticated, we cannot ensure that it will always work on either your end or our end. If the connection with a video visit is poor, the visit may have to be switched to a telephone visit. With either a video or telephone visit, we are not always able to ensure that we have a secure connection.  By engaging in this virtual visit, you consent to the provision of healthcare and authorize for your insurance to be billed (if applicable) for the services provided during this visit. Depending on your insurance coverage, you may receive a charge related to this service.  I need to obtain your verbal consent now. Are you willing to proceed with your visit today? ANNYSSA MOFFIT has provided verbal consent on 07/01/2023 for a virtual visit (video or telephone). Mariah Richardson, [place]  Date: 07/01/2023 9:29 AM  Virtual Visit via Video Note   I, Mariah Richardson, connected with  MIKESHIA ZOTTER  ([id number], 06-Apr-1986) on 07/01/23 at  9:30 AM EST by a video-enabled telemedicine application and verified that I am speaking with the correct person using two identifiers.  Location: Patient: Virtual Visit  Location Patient: Home Provider: Virtual Visit Location Provider: Home Office   I discussed the limitations of evaluation and management by telemedicine and the availability of in person appointments. The patient expressed understanding and agreed to proceed.    History of Present Illness: Mariah Richardson is a 37 y.o. who identifies as a female who was assigned female at birth, and is being seen today for URI symptoms over the past 3.5 weeks. Initially started out as nasal and sinus congestion. Progressed since that time to thick nasal discharge, sinus pain and tooth pain. Also with productive cough and chest tightness. Some SOB/wheezing on exertion. Has tested for COVID several times since symptom onset but all negative.  OTC -- Tylenol, Benadryl, OTC cough medications.    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   Anxiety 10/03/2015    Allergies:  Allergies  Allergen Reactions   Amoxicillin Hives   Latex    Penicillins Hives   Medications:  Current Outpatient Medications:    benzonatate (TESSALON) 100 MG capsule, Take 1 capsule (100 mg total) by mouth 3 (three) times daily as needed for cough., Disp: 30 capsule, Rfl: 0  doxycycline (VIBRA-TABS) 100 MG tablet, Take 1 tablet (100 mg total) by mouth 2 (two) times daily., Disp: 20 tablet, Rfl: 0   predniSONE (DELTASONE) 20 MG tablet, Take 2 tablets (40 mg total) by mouth daily with breakfast., Disp: 10 tablet, Rfl: 0  Observations/Objective: Patient is well-developed, well-nourished in no acute distress.  Resting comfortably  at home.  Head is normocephalic, atraumatic.  No  labored breathing.  Speech is clear and coherent with logical content.  Patient is alert and oriented at baseline.   Assessment and Plan: 1. Acute bacterial sinusitis - doxycycline (VIBRA-TABS) 100 MG tablet; Take 1 tablet (100 mg total) by mouth 2 (two) times daily.  Dispense: 20 tablet; Refill: 0 - benzonatate (TESSALON) 100 MG capsule; Take 1 capsule (100 mg total) by mouth 3 (three) times daily as needed for cough.  Dispense: 30 capsule; Refill: 0  2. Wheezing - predniSONE (DELTASONE) 20 MG tablet; Take 2 tablets (40 mg total) by mouth daily with breakfast.  Dispense: 10 tablet; Refill: 0  Rx Doxycycline.  Increase fluids.  Rest.  Saline nasal spray.  Probiotic.  Mucinex as directed.  Humidifier in bedroom. Tessalon for cough. Continue albuterol as directed, when needed. 5-day burst of prednisone given as well for this.  Call or return to clinic if symptoms are not improving.   Follow Up Instructions: I discussed the assessment and treatment plan with the patient. The patient was provided an opportunity to ask questions and all were answered. The patient agreed with the plan and demonstrated an understanding of the instructions.  A copy of instructions were sent to the patient via MyChart unless otherwise noted below.   The patient was advised to call back or seek an in-person evaluation if the symptoms worsen or if the condition fails to improve as anticipated.    Mariah Climes, PA-C

## 2023-07-01 NOTE — Patient Instructions (Signed)
Mariah Richardson, thank you for joining [name], PA-C for today's virtual visit.  While this provider is not your primary care provider (PCP), if your PCP is located in our provider database this encounter information will be shared with them immediately following your visit.   A [facility] MyChart account gives you access to today's visit and all your visits, tests, and labs performed at [facility] " click here if you don't have a [facility] MyChart account or go to [email or web address]  Consent: (Patient) Mariah Richardson provided verbal consent for this virtual visit at the beginning of the encounter.  Current Medications:  Current Outpatient Medications:    azithromycin (ZITHROMAX Z-PAK) 250 MG tablet, Take 2 tablets (500 mg) on  Day 1,  followed by 1 tablet (250 mg) once daily on Days 2 through 5. (Patient not taking: Reported on 05/29/2023), Disp: 6 each, Rfl: 0   ciprofloxacin-dexamethasone (CIPRODEX) OTIC suspension, Place 4 drops into the right ear 2 (two) times daily. (Patient not taking: Reported on 05/29/2023), Disp: 7.5 mL, Rfl: 0   fluconazole (DIFLUCAN) 150 MG tablet, Take 1 tablet (150 mg total) by mouth every 3 (three) days. For three doses (Patient not taking: Reported on 05/29/2023), Disp: 1 tablet, Rfl: 2   fluconazole (DIFLUCAN) 150 MG tablet, Take 1 tablet (150 mg total) by mouth every 3 (three) days. For three doses (Patient not taking: Reported on 05/29/2023), Disp: 3 tablet, Rfl: 0   phentermine 37.5 MG capsule, Take 1 capsule (37.5 mg total) by mouth every morning. (Patient not taking: Reported on 05/29/2023), Disp: 30 capsule, Rfl: 0   Medications ordered in this encounter:  No orders of the defined types were placed in this encounter.    *If you need refills on other medications prior to your next appointment, please contact your pharmacy*  Follow-Up: Call back or seek an in-person evaluation if the symptoms worsen or if the condition  fails to improve as anticipated.  [facility] [phone]  Other Instructions Please take antibiotic as directed.  Increase fluid intake.  Use Saline nasal spray.  Take a daily multivitamin. Use the Tessalon as directed for cough. Continue your albuterol inhaler. Take the prednisone as directed.  Place a humidifier in the bedroom.  Please call or return clinic if symptoms are not improving.  Sinusitis Sinusitis is redness, soreness, and swelling (inflammation) of the paranasal sinuses. Paranasal sinuses are air pockets within the bones of your face (beneath the eyes, the middle of the forehead, or above the eyes). In healthy paranasal sinuses, mucus is able to drain out, and air is able to circulate through them by way of your nose. However, when your paranasal sinuses are inflamed, mucus and air can become trapped. This can allow bacteria and other germs to grow and cause infection. Sinusitis can develop quickly and last only a short time (acute) or continue over a long period (chronic). Sinusitis that lasts for more than 12 weeks is considered chronic.  CAUSES  Causes of sinusitis include: Allergies. Structural abnormalities, such as displacement of the cartilage that separates your nostrils (deviated septum), which can decrease the air flow through your nose and sinuses and affect sinus drainage. Functional abnormalities, such as when the small hairs (cilia) that line your sinuses and help remove mucus do not work properly or are not present. SYMPTOMS  Symptoms of acute and chronic sinusitis are the same. The primary symptoms are pain and pressure around the affected sinuses. Other symptoms  include: Upper toothache. Earache. Headache. Bad breath. Decreased sense of smell and taste. A cough, which worsens when you are lying flat. Fatigue. Fever. Thick drainage from your nose, which often is green and may contain pus (purulent). Swelling and warmth over the affected  sinuses. DIAGNOSIS  Your caregiver will perform a physical exam. During the exam, your caregiver may: Look in your nose for signs of abnormal growths in your nostrils (nasal polyps). Tap over the affected sinus to check for signs of infection. View the inside of your sinuses (endoscopy) with a special imaging device with a light attached (endoscope), which is inserted into your sinuses. If your caregiver suspects that you have chronic sinusitis, one or more of the following tests may be recommended: Allergy tests. Nasal culture A sample of mucus is taken from your nose and sent to a lab and screened for bacteria. Nasal cytology A sample of mucus is taken from your nose and examined by your caregiver to determine if your sinusitis is related to an allergy. TREATMENT  Most cases of acute sinusitis are related to a viral infection and will resolve on their own within 10 days. Sometimes medicines are prescribed to help relieve symptoms (pain medicine, decongestants, nasal steroid sprays, or saline sprays).  However, for sinusitis related to a bacterial infection, your caregiver will prescribe antibiotic medicines. These are medicines that will help kill the bacteria causing the infection.  Rarely, sinusitis is caused by a fungal infection. In theses cases, your caregiver will prescribe antifungal medicine. For some cases of chronic sinusitis, surgery is needed. Generally, these are cases in which sinusitis recurs more than 3 times per year, despite other treatments. HOME CARE INSTRUCTIONS  Drink plenty of water. Water helps thin the mucus so your sinuses can drain more easily. Use a humidifier. Inhale steam 3 to 4 times a day (for example, sit in the bathroom with the shower running). Apply a warm, moist washcloth to your face 3 to 4 times a day, or as directed by your caregiver. Use saline nasal sprays to help moisten and clean your sinuses. Take over-the-counter or prescription medicines for pain,  discomfort, or fever only as directed by your caregiver. SEEK IMMEDIATE MEDICAL CARE IF: You have increasing pain or severe headaches. You have nausea, vomiting, or drowsiness. You have swelling around your face. You have vision problems. You have a stiff neck. You have difficulty breathing. MAKE SURE YOU:  Understand these instructions. Will watch your condition. Will get help right away if you are not doing well or get worse. Document Released: 07/08/2005 Document Revised: 09/30/2011 Document Reviewed: 07/23/2011 [facility] Patient Information 2014 [place], [place].    If you have been instructed to have an in-person evaluation today at a local Urgent Care facility, please use the link below. It will take you to a list of all of our available [facility]gent [facility], including address, phone number and hours of operation. Please do not delay care.  [facility]  If you or a family member do not have a primary care provider, use the link below to schedule a visit and establish care. When you choose a [facility] primary care physician or advanced practice provider, you gain a long-term partner in health. Find a Primary Care Provider  Learn more about [facility]'s in-office and virtual care options: [facility] - Get Care Now

## 2023-07-03 ENCOUNTER — Telehealth: Payer: Medicaid Other | Admitting: Physician Assistant

## 2023-07-03 DIAGNOSIS — B9689 Other specified bacterial agents as the cause of diseases classified elsewhere: Secondary | ICD-10-CM

## 2023-07-03 MED ORDER — ONDANSETRON 4 MG PO TBDP: 4.0000 mg | ORAL_TABLET | Freq: Three times a day (TID) | ORAL | 0 refills | Status: DC | PRN

## 2023-07-03 MED ORDER — AZITHROMYCIN 250 MG PO TABS: ORAL_TABLET | ORAL | 0 refills | Status: AC

## 2023-07-03 NOTE — Progress Notes (Signed)
Patient seen 2 days ago by this provider via video visit.  Having intolerance to antibiotic.  Stop Doxycycline. Giving penicillin allergy, will start Azithromycin.  Continue other recommendations given at time of visit 12/10. No charge for EV.

## 2023-07-03 NOTE — Addendum Note (Signed)
Addended by: Waldon Merl on: 07/03/2023 04:50 PM   Modules accepted: Orders

## 2023-07-06 ENCOUNTER — Telehealth: Payer: Medicaid Other | Admitting: Family

## 2023-07-06 DIAGNOSIS — N898 Other specified noninflammatory disorders of vagina: Secondary | ICD-10-CM

## 2023-07-06 NOTE — Progress Notes (Signed)
Because of the vaginal discharge and back pain, I feel your condition warrants further evaluation and I recommend that you be seen in a face to face visit.   NOTE: There will be NO CHARGE for this eVisit   If you are having a true medical emergency please call 911.      For an urgent face to face visit, [facility] has eight urgent care centers for your convenience:   NEW!! [facility] Urgent Care Center at [facility] Get Driving Directions [phone] [address], [address] [place], [address]    [facility] Health Urgent Care Center at [facility] Get Driving Directions [phone] [address] [place], [place] [address]   [facility] Health Urgent Care Center [facility]) Get Driving Directions [phone] [address] [place], [place] [address]  [facility] Health Urgent Care Center [facility]) Get Driving Directions [phone] [address] [place],  [place]  [address]  [facility] Health Urgent Care Center [facility] - at [employer]  [phone] [place],  [place] [address]   [facility] Health Urgent Care at [facility] Get Driving Directions [phone] [address], [address] [place], [place] [address]   [facility] Health Urgent Care at [facility] Get Driving Directions  [phone] [address] [place], [place] [address]   [facility] Health Urgent Care at [facility] Directions [phone] [address] [place], [place] [address]  Your MyChart E-visit questionnaire answers were reviewed by a board certified advanced clinical practitioner to complete your personal care plan based on your specific symptoms.  Thank you for using e-Visits.

## 2023-07-08 ENCOUNTER — Other Ambulatory Visit: Payer: Self-pay | Admitting: Medical Genetics

## 2023-07-29 ENCOUNTER — Ambulatory Visit: Payer: Medicaid Other

## 2023-07-30 ENCOUNTER — Ambulatory Visit: Payer: Medicaid Other

## 2023-07-30 NOTE — Progress Notes (Deleted)
    NURSE VISIT NOTE  Subjective:    Patient ID: Mariah Richardson, female    DOB: 16-Dec-1985, 38 y.o.   MRN: [id number]  HPI  Patient is a 38 y.o. [id number] female Single African American female who presents for her {FIRST SECOND THIRD:18671} Gardasil injection. Order to administer given by Estil Mangle, MD on 05/29/23.   Objective:    There were no vitals taken for this visit.  38 y.o. LMP:  ***  Contraception:  {CCO Contraception:21020264} Given by: {AOB Clinical Dujqq:71459} Site:  {left/right:311354} deltoid  Lab Review  No results found for any visits on 07/30/23.    Assessment:   No diagnosis found.   Plan:   Patient will return in {Gardasil Return Visit:28539} for {FIRST SECOND THIRD:18671} injection.    Rollo JINNY Maxin, CMA

## 2023-07-31 ENCOUNTER — Telehealth: Payer: Medicaid Other | Admitting: Physician Assistant

## 2023-07-31 DIAGNOSIS — B379 Candidiasis, unspecified: Secondary | ICD-10-CM | POA: Diagnosis not present

## 2023-07-31 DIAGNOSIS — T3695XA Adverse effect of unspecified systemic antibiotic, initial encounter: Secondary | ICD-10-CM | POA: Diagnosis not present

## 2023-07-31 MED ORDER — FLUCONAZOLE 150 MG PO TABS: 150.0000 mg | ORAL_TABLET | Freq: Once | ORAL | 0 refills | Status: AC

## 2023-07-31 NOTE — Progress Notes (Signed)

## 2023-08-11 ENCOUNTER — Encounter: Payer: Self-pay | Admitting: Physician Assistant

## 2023-08-11 ENCOUNTER — Ambulatory Visit: Payer: Medicaid Other | Admitting: Physician Assistant

## 2023-08-11 VITALS — BP 135/56 | HR 81 | Resp 16 | Ht 62.0 in | Wt 231.1 lb

## 2023-08-11 DIAGNOSIS — I10 Essential (primary) hypertension: Secondary | ICD-10-CM | POA: Diagnosis not present

## 2023-08-11 DIAGNOSIS — E669 Obesity, unspecified: Secondary | ICD-10-CM

## 2023-08-11 DIAGNOSIS — E7849 Other hyperlipidemia: Secondary | ICD-10-CM

## 2023-08-11 DIAGNOSIS — E049 Nontoxic goiter, unspecified: Secondary | ICD-10-CM

## 2023-08-11 DIAGNOSIS — R7303 Prediabetes: Secondary | ICD-10-CM

## 2023-08-11 NOTE — Progress Notes (Unsigned)
New patient visit  Patient: Mariah Richardson   DOB: [birth year]   38 y.o. Female  MRN: [id number] Visit Date: 08/11/2023  Today's healthcare provider: Debera Lat, PA-C   Chief Complaint  Patient presents with  . Establish Care  . Weight Management Screening    Patient has tried weight watchers,Noom, Phentermine, Metformin for Pre Diabetes.    Subjective    Mariah Richardson is a 38 y.o. female who presents today as a new patient to establish care.  HPI HPI     Weight Management Screening    Additional comments: Patient has tried weight watchers,Noom, Phentermine, Metformin for Pre Diabetes.       Last edited by Marjie Skiff, CMA on 08/11/2023  2:28 PM.      *** Discussed the use of AI scribe software for clinical note transcription with the patient, who gave verbal consent to proceed.  History of Present Illness            Past Medical History:  Diagnosis Date  . Allergy   . Gastritis   . Hypertension    Past Surgical History:  Procedure Laterality Date  . APPENDECTOMY    . CHOLECYSTECTOMY     Family Status  Relation Name Status  . Mother Chauncy Lean (Not Specified)  . Father  (Not Specified)  . MGM Vi (Not Specified)  . PGM Jd (Not Specified)  . PGF Jd (Not Specified)  No partnership data on file   Family History  Problem Relation Age of Onset  . Diabetes Mother   . Heart disease Mother   . Migraines Father   . Diverticulitis Father   . Cancer Maternal Grandmother   . Stroke Maternal Grandmother   . Heart disease Maternal Grandmother   . Cancer Paternal Grandmother   . Heart disease Paternal Grandfather   . Stroke Paternal Grandfather   . Hearing loss Paternal Grandfather    Social History   Socioeconomic History  . Marital status: Single    Spouse name: Not on file  . Number of children: Not on file  . Years of education: Not on file  . Highest education level: Some college, no degree  Occupational History  . Not on file  Tobacco Use   . Smoking status: Never  . Smokeless tobacco: Never  Vaping Use  . Vaping status: Never Used  Substance and Sexual Activity  . Alcohol use: No  . Drug use: No  . Sexual activity: Yes    Birth control/protection: Abstinence, None  Other Topics Concern  . Not on file  Social History Narrative  . Not on file   Social Drivers of Health   Financial Resource Strain: Low Risk  (08/07/2023)   Overall Financial Resource Strain (CARDIA)   . Difficulty of Paying Living Expenses: Not hard at all  Food Insecurity: No Food Insecurity (08/07/2023)   Hunger Vital Sign   . Worried About Programme researcher, broadcasting/film/video in the Last Year: Never true   . Ran Out of Food in the Last Year: Never true  Transportation Needs: No Transportation Needs (08/07/2023)   PRAPARE - Transportation   . Lack of Transportation (Medical): No   . Lack of Transportation (Non-Medical): No  Physical Activity: Insufficiently Active (08/07/2023)   Exercise Vital Sign   . Days of Exercise per Week: 3 days   . Minutes of Exercise per Session: 30 min  Stress: Stress Concern Present (08/07/2023)   [employer] of Occupational Health - Occupational Stress Questionnaire   .  Feeling of Stress : Very much  Social Connections: Moderately Isolated (08/07/2023)   Social Connection and Isolation Panel [NHANES]   . Frequency of Communication with Friends and Family: More than three times a week   . Frequency of Social Gatherings with Friends and Family: Once a week   . Attends Religious Services: More than 4 times per year   . Active Member of Clubs or Organizations: No   . Attends Banker Meetings: Not on file   . Marital Status: Divorced   Outpatient Medications Prior to Visit  Medication Sig  . benzonatate (TESSALON) 100 MG capsule Take 1 capsule (100 mg total) by mouth 3 (three) times daily as needed for cough.  . ondansetron (ZOFRAN-ODT) 4 MG disintegrating tablet Take 1 tablet (4 mg total) by mouth every 8 (eight) hours  as needed for nausea or vomiting.  . predniSONE (DELTASONE) 20 MG tablet Take 2 tablets (40 mg total) by mouth daily with breakfast.   No facility-administered medications prior to visit.   Allergies  Allergen Reactions  . Amoxicillin Hives  . Latex   . Penicillins Hives    Immunization History  Administered Date(s) Administered  . HPV 9-valent 05/29/2023  . Influenza, Seasonal, Injecte, Preservative Fre 05/29/2023  . Influenza-Unspecified 08/23/2020, 09/19/2020  . Moderna SARS-COV2 Booster Vaccination 07/03/2020  . Moderna Sars-Covid-2 Vaccination 12/30/2019, 01/27/2020  . Tdap 03/27/2021    Health Maintenance  Topic Date Due  . COVID-19 Vaccine (4 - 2024-25 season) 03/23/2023  . HPV VACCINES (2 - 3-dose SCDM series) 06/26/2023  . Cervical Cancer Screening (HPV/Pap Cotest)  11/19/2027  . DTaP/Tdap/Td (2 - Td or Tdap) 03/28/2031  . INFLUENZA VACCINE  Completed  . Hepatitis C Screening  Completed  . HIV Screening  Completed    Patient Care Team: Debera Lat, PA-C as PCP - General (Physician Assistant)  Review of Systems Except see HPI   {Insert previous labs (optional):23779} {See past labs  Heme  Chem  Endocrine  Serology  Results Review (optional):1}   Objective    BP (!) 135/56 (BP Location: Left Arm, Patient Position: Sitting, Cuff Size: Large)   Pulse 81   Resp 16   Ht 5\' 2"  (1.575 m)   Wt 231 lb 1.6 oz (104.8 kg)   LMP 08/06/2023   BMI 42.27 kg/m  {Insert last BP/Wt (optional):23777}{See vitals history (optional):1}   Physical Exam  Depression Screen    08/11/2023    2:33 PM 07/31/2021    4:22 PM 05/09/2021    8:59 AM 02/28/2021    9:37 AM  PHQ 2/9 Scores  PHQ - 2 Score 0 0 0 0  PHQ- 9 Score 3 0 0 6   No results found for any visits on 08/11/23.  Assessment & Plan     *** Assessment and Plan              Encounter to establish care Welcomed to our clinic Reviewed past medical hx, social hx, family hx and surgical hx Pt  advised to send all vaccination records or screening   No follow-ups on file.    The patient was advised to call back or seek an in-person evaluation if the symptoms worsen or if the condition fails to improve as anticipated.  I discussed the assessment and treatment plan with the patient. The patient was provided an opportunity to ask questions and all were answered. The patient agreed with the plan and demonstrated an understanding of the instructions.  I, Debera Lat,  PA-C have reviewed all documentation for this visit. The documentation on  08/11/2023   for the exam, diagnosis, procedures, and orders are all accurate and complete.  Debera Lat, [facility], MMS [facility] [phone] (phone) [phone] (fax)  [facility]

## 2023-08-12 LAB — COMPREHENSIVE METABOLIC PANEL
ALT: 11 [IU]/L (ref 0–32)
AST: 17 [IU]/L (ref 0–40)
Albumin: 4.3 g/dL (ref 3.9–4.9)
Alkaline Phosphatase: 54 [IU]/L (ref 44–121)
BUN/Creatinine Ratio: 17 (ref 9–23)
BUN: 11 mg/dL (ref 6–20)
Bilirubin Total: 0.3 mg/dL (ref 0.0–1.2)
CO2: 25 mmol/L (ref 20–29)
Calcium: 9.4 mg/dL (ref 8.7–10.2)
Chloride: 104 mmol/L (ref 96–106)
Creatinine, Ser: 0.63 mg/dL (ref 0.57–1.00)
Globulin, Total: 2.8 g/dL (ref 1.5–4.5)
Glucose: 84 mg/dL (ref 70–99)
Potassium: 4.3 mmol/L (ref 3.5–5.2)
Sodium: 142 mmol/L (ref 134–144)
Total Protein: 7.1 g/dL (ref 6.0–8.5)
eGFR: 118 mL/min/{1.73_m2} (ref >59)

## 2023-08-12 LAB — CBC WITH DIFFERENTIAL/PLATELET
Basophils Absolute: 0 10*3/uL (ref 0.0–0.2)
Basos: 1 %
EOS (ABSOLUTE): 0.1 10*3/uL (ref 0.0–0.4)
Eos: 2 %
Hematocrit: 37.3 % (ref 34.0–46.6)
Hemoglobin: 11.8 g/dL (ref 11.1–15.9)
Immature Grans (Abs): 0 10*3/uL (ref 0.0–0.1)
Immature Granulocytes: 1 %
Lymphocytes Absolute: 1.8 10*3/uL (ref 0.7–3.1)
Lymphs: 38 %
MCH: 27.5 pg (ref 26.6–33.0)
MCHC: 31.6 g/dL (ref 31.5–35.7)
MCV: 87 fL (ref 79–97)
Monocytes Absolute: 0.4 10*3/uL (ref 0.1–0.9)
Monocytes: 9 %
Neutrophils Absolute: 2.3 10*3/uL (ref 1.4–7.0)
Neutrophils: 49 %
Platelets: 275 10*3/uL (ref 150–450)
RBC: 4.29 x10E6/uL (ref 3.77–5.28)
RDW: 13.6 % (ref 11.7–15.4)
WBC: 4.7 10*3/uL (ref 3.4–10.8)

## 2023-08-14 ENCOUNTER — Encounter: Payer: Self-pay | Admitting: Physician Assistant

## 2023-08-18 ENCOUNTER — Ambulatory Visit
Admission: RE | Admit: 2023-08-18 | Discharge: 2023-08-18 | Disposition: A | Discharge status: Home / Self Care | Payer: Medicaid Other | Source: Ambulatory Visit | Attending: Physician Assistant | Admitting: Physician Assistant

## 2023-08-18 DIAGNOSIS — E049 Nontoxic goiter, unspecified: Secondary | ICD-10-CM | POA: Insufficient documentation

## 2023-08-19 ENCOUNTER — Ambulatory Visit: Payer: Medicaid Other

## 2023-08-20 ENCOUNTER — Encounter: Payer: Self-pay | Admitting: Physician Assistant

## 2023-08-26 ENCOUNTER — Ambulatory Visit: Payer: Medicaid Other | Admitting: Physician Assistant

## 2023-08-26 ENCOUNTER — Encounter: Payer: Self-pay | Admitting: Physician Assistant

## 2023-08-26 VITALS — BP 118/78 | HR 95 | Ht 62.0 in | Wt 228.0 lb

## 2023-08-26 DIAGNOSIS — E669 Obesity, unspecified: Secondary | ICD-10-CM | POA: Diagnosis not present

## 2023-08-26 DIAGNOSIS — E049 Nontoxic goiter, unspecified: Secondary | ICD-10-CM

## 2023-08-26 DIAGNOSIS — E7849 Other hyperlipidemia: Secondary | ICD-10-CM | POA: Diagnosis not present

## 2023-08-26 DIAGNOSIS — R7303 Prediabetes: Secondary | ICD-10-CM | POA: Diagnosis not present

## 2023-08-26 DIAGNOSIS — I10 Essential (primary) hypertension: Secondary | ICD-10-CM

## 2023-08-26 MED ORDER — METFORMIN HCL 500 MG PO TABS: 500.0000 mg | ORAL_TABLET | Freq: Every day | ORAL | 3 refills | Status: DC

## 2023-08-26 NOTE — Progress Notes (Signed)
 Established patient visit  Patient: Mariah Richardson   DOB: 1986-04-10   38 y.o. Female  MRN: [id number] Visit Date: 08/26/2023  Today's healthcare provider: Jolynn Spencer, PA-C   Chief Complaint  Patient presents with   Hypertension   Subjective       Discussed the use of AI scribe software for clinical note transcription with the patient, who gave verbal consent to proceed.  History of Present Illness   The patient presents with a history of high blood pressure, which has been consistently elevated at home, with readings as high as 188/108. She reports that her blood pressure is typically higher in the morning than in the evening. Despite these high readings at home, her blood pressure has been normal during office visits. She denies taking any medication for her blood pressure. She attributes her high blood pressure to stress, particularly related to caring for her four children and working from home. She also reports a prediabetic range A1c of 5.7 and high triglycerides. She has made dietary changes, including eating less salt and spicy foods, and drinking green tea. She also reports blurry vision, which is not corrected with glasses, and tingling in her fingers and toes. She is scheduled to see an ophthalmologist.          08/11/2023    2:33 PM 07/31/2021    4:22 PM 05/09/2021    8:59 AM  Depression screen PHQ 2/9  Decreased Interest 0 0 0  Down, Depressed, Hopeless 0 0 0  PHQ - 2 Score 0 0 0  Altered sleeping 3 0 0  Tired, decreased energy 0 0 0  Change in appetite 0 0 0  Feeling bad or failure about yourself  0 0 0  Trouble concentrating 0 0 0  Moving slowly or fidgety/restless 0 0 0  Suicidal thoughts 0 0 0  PHQ-9 Score 3 0 0  Difficult doing work/chores Not difficult at all        08/11/2023    2:34 PM 08/11/2023    2:33 PM 07/31/2021    4:22 PM 05/09/2021    9:00 AM  GAD 7 : Generalized Anxiety Score  Nervous, Anxious, on Edge 0 0 0 0  Control/stop worrying 3 3  0 0  Worry too much - different things 3 3 0 0  Trouble relaxing 0 0 0 0  Restless 0 0 0 0  Easily annoyed or irritable 3 3 0 0  Afraid - awful might happen 0 0 0 0  Total GAD 7 Score 9 9 0 0  Anxiety Difficulty Not difficult at all Not difficult at all Not difficult at all     Medications: Outpatient Medications Prior to Visit  Medication Sig   [DISCONTINUED] predniSONE  (DELTASONE ) 20 MG tablet Take 2 tablets (40 mg total) by mouth daily with breakfast.   No facility-administered medications prior to visit.    Review of Systems All negative Except see HPI       Objective    BP 118/78   Pulse 95   Ht 5' 2 (1.575 m)   Wt 228 lb (103.4 kg)   LMP 08/06/2023   SpO2 100%   BMI 41.70 kg/m     Physical Exam Vitals reviewed.  Constitutional:      General: She is not in acute distress.    Appearance: Normal appearance. She is well-developed. She is not diaphoretic.  HENT:     Head: Normocephalic and atraumatic.  Eyes:     General: No  scleral icterus.    Conjunctiva/sclera: Conjunctivae normal.  Neck:     Thyroid : No thyromegaly.  Cardiovascular:     Rate and Rhythm: Normal rate and regular rhythm.     Pulses: Normal pulses.     Heart sounds: Normal heart sounds. No murmur heard. Pulmonary:     Effort: Pulmonary effort is normal. No respiratory distress.     Breath sounds: Normal breath sounds. No wheezing, rhonchi or rales.  Musculoskeletal:     Cervical back: Neck supple.     Right lower leg: No edema.     Left lower leg: No edema.  Lymphadenopathy:     Cervical: No cervical adenopathy.  Skin:    General: Skin is warm and dry.     Findings: No rash.  Neurological:     Mental Status: She is alert and oriented to person, place, and time. Mental status is at baseline.  Psychiatric:        Mood and Affect: Mood normal.        Behavior: Behavior normal.      No results found for any visits on 08/26/23.      Assessment and Plan     Hypertension Variable blood pressure readings at home, with some significantly elevated readings. Stress and lifestyle factors may be contributing. No current antihypertensive medication. -Start antihypertensive medication. -Continue home blood pressure monitoring, with particular attention to potential triggers. -Bring blood pressure device to next appointment for comparison with office readings. -Consider stress management techniques and maintain regular exercise.  Prediabetes Recently diagnosed Last A1C 5.7, indicating prediabetes. No current medication. -Start Metformin . -Adhere to a low carbohydrate and low sugar diet, as recommended by the American Diabetic Association. -Check A1C and lipids at next appointment.  Visual disturbances Reports of blurry vision and floaters. -Attend scheduled ophthalmology appointment on Monday. -Report any changes or worsening of symptoms.  Paresthesia In both hands Reports of tingling in fingers and toes, possibly related to extensive computer use and prediabetes -Consider use of wrist braces when typing. Will reassess Follow-up in one month with labs and blood pressure device.     Obesity (BMI 30-39.9) (Primary) Chronic. Body mass index is 41.7 kg/m. Pt lost 3 pounds since last visit. Weight loss of 5% of pt's current weight via healthy diet and daily exercise encouraged.  - metFORMIN  (GLUCOPHAGE ) 500 MG tablet; Take 1 tablet (500 mg total) by mouth daily with breakfast.  Dispense: 90 tablet; Refill: 3  Prediabetes - metFORMIN  (GLUCOPHAGE ) 500 MG tablet; Take 1 tablet (500 mg total) by mouth daily with breakfast.  Dispense: 90 tablet; Refill: 3   Other hyperlipidemia In the past were elevated. Will recheck in a month Advised low cholesterol diet and daily exercise.  Substitutions to help cholesterol:   Substituting soy-based products (such as tofu or tempeh) for any meat in recipes   Substituting a lean meat such as (non-fried)  poultry or fish (other than shrimp) for red meat in recipes   Replacing refined grain products with higher-fiber whole-grain products whenever possible   Drinking tea, carbonated water, or plain water instead of sugar-sweetened soft drinks and fruit juices   Using a nut butter spread instead of traditional dairy butter   Goiter TSH was wnl, 0.881 from 2 months ago Will monitor and observe      No orders of the defined types were placed in this encounter.   Return in about 4 weeks (around 09/23/2023) for chronic disease f/u, prediabetes, labs.   The patient was  advised to call back or seek an in-person evaluation if the symptoms worsen or if the condition fails to improve as anticipated.  I discussed the assessment and treatment plan with the patient. The patient was provided an opportunity to ask questions and all were answered. The patient agreed with the plan and demonstrated an understanding of the instructions.  I, Zeriyah Wain, PA-C have reviewed all documentation for this visit. The documentation on 08/26/2023  for the exam, diagnosis, procedures, and orders are all accurate and complete.  Jolynn Spencer, [facility], MMS [facility] [phone] (phone) [phone] (fax)  [facility]

## 2023-09-01 DIAGNOSIS — H5213 Myopia, bilateral: Secondary | ICD-10-CM | POA: Diagnosis not present

## 2023-09-07 ENCOUNTER — Telehealth: Payer: Medicaid Other | Admitting: Nurse Practitioner

## 2023-09-07 DIAGNOSIS — R112 Nausea with vomiting, unspecified: Secondary | ICD-10-CM | POA: Diagnosis not present

## 2023-09-07 MED ORDER — ONDANSETRON HCL 4 MG PO TABS: 4.0000 mg | ORAL_TABLET | Freq: Three times a day (TID) | ORAL | 0 refills | Status: DC | PRN

## 2023-09-07 NOTE — Progress Notes (Signed)
 I have spent 5 minutes in review of e-visit questionnaire, review and updating patient chart, medical decision making and response to patient.   Claiborne Rigg, NP

## 2023-09-07 NOTE — Progress Notes (Signed)
Will send zofran and recommend you speak to your PCP about side effects possibly related to metformin if you are started taking this medication.  E-Visit for Vomiting  We are sorry that you are not feeling well. Here is how we plan to help!  Based on what you have shared with me it looks like you have a Virus that is irritating your GI tract.  Vomiting is the forceful emptying of a portion of the stomach's content through the mouth.  Although nausea and vomiting can make you feel miserable, it's important to remember that these are not diseases, but rather symptoms of an underlying illness.  When we treat short term symptoms, we always caution that any symptoms that persist should be fully evaluated in a medical office.  I have prescribed a medication that will help alleviate your symptoms and allow you to stay hydrated:  Zofran 4 mg 1 tablet every 8 hours as needed for nausea and vomiting  HOME CARE: Drink clear liquids.  This is very important! Dehydration (the lack of fluid) can lead to a serious complication.  Start off with 1 tablespoon every 5 minutes for 8 hours. You may begin eating bland foods after 8 hours without vomiting.  Start with saltine crackers, white bread, rice, mashed potatoes, applesauce. After 48 hours on a bland diet, you may resume a normal diet. Try to go to sleep.  Sleep often empties the stomach and relieves the need to vomit.  GET HELP RIGHT AWAY IF:  Your symptoms do not improve or worsen within 2 days after treatment. You have a fever for over 3 days. You cannot keep down fluids after trying the medication.  MAKE SURE YOU:  Understand these instructions. Will watch your condition. Will get help right away if you are not doing well or get worse.   Thank you for choosing an e-visit.  Your e-visit answers were reviewed by a board certified advanced clinical practitioner to complete your personal care plan. Depending upon the condition, your plan could have  included both over the counter or prescription medications.  Please review your pharmacy choice. Make sure the pharmacy is open so you can pick up prescription now. If there is a problem, you may contact your provider through [employer] and have the prescription routed to another pharmacy.  Your safety is important to [place]. If you have drug allergies check your prescription carefully.   For the next 24 hours you can use MyChart to ask questions about today's visit, request a non-urgent call back, or ask for a work or school excuse. You will get an email in the next two days asking about your experience. I hope that your e-visit has been valuable and will speed your recovery.

## 2023-09-22 ENCOUNTER — Telehealth

## 2023-09-22 NOTE — Progress Notes (Unsigned)
 Established patient visit  Patient: Mariah Richardson   DOB: May 13, 1986   38 y.o. Female  MRN: [id number] Visit Date: 09/23/2023  Today's healthcare provider: Debera Lat, PA-C   No chief complaint on file.  Subjective       Discussed the use of AI scribe software for clinical note transcription with the patient, who gave verbal consent to proceed.  History of Present Illness         Hi Rael, Please let pt know test results are back:   -Cholesterol: Is elevated, but better than 6 mos ago. Would discuss this with your PCP. -Screen for Hepatitis C: was negative like we expected. -A1C: 5.7% which is JUST over the cutoff of 5.6, and means you are prediabetic. This can be easily reversed with diet and exercise, should speak to PCP about this. -TSH: 0.8, thyroid and metabolism are functioning in a healthy range. -Vit D: 10, below suggestion to keep around 30. Recommend taking OTC 1000-2000 IU (International Units) daily. -Vaginal swab: negative for gonorrhea, chlamydia, trichomonas -HIV and hepatitis negative, RPR was positive like you told us, confirmatory testing now pending       08/11/2023    2:33 PM 07/31/2021    4:22 PM 05/09/2021    8:59 AM  Depression screen PHQ 2/9  Decreased Interest 0 0 0  Down, Depressed, Hopeless 0 0 0  PHQ - 2 Score 0 0 0  Altered sleeping 3 0 0  Tired, decreased energy 0 0 0  Change in appetite 0 0 0  Feeling bad or failure about yourself  0 0 0  Trouble concentrating 0 0 0  Moving slowly or fidgety/restless 0 0 0  Suicidal thoughts 0 0 0  PHQ-9 Score 3 0 0  Difficult doing work/chores Not difficult at all        08/11/2023    2:34 PM 08/11/2023    2:33 PM 07/31/2021    4:22 PM 05/09/2021    9:00 AM  GAD 7 : Generalized Anxiety Score  Nervous, Anxious, on Edge 0 0 0 0  Control/stop worrying 3 3 0 0  Worry too much - different things 3 3 0 0  Trouble relaxing 0 0 0 0  Restless 0 0 0 0  Easily annoyed or irritable 3 3 0 0   Afraid - awful might happen 0 0 0 0  Total GAD 7 Score 9 9 0 0  Anxiety Difficulty Not difficult at all Not difficult at all Not difficult at all     Medications: Outpatient Medications Prior to Visit  Medication Sig   metFORMIN (GLUCOPHAGE) 500 MG tablet Take 1 tablet (500 mg total) by mouth daily with breakfast.   ondansetron (ZOFRAN) 4 MG tablet Take 1 tablet (4 mg total) by mouth every 8 (eight) hours as needed for nausea or vomiting.   No facility-administered medications prior to visit.    Review of Systems  All other systems reviewed and are negative.  All negative Except see HPI   {Insert previous labs (optional):23779} {See past labs  Heme  Chem  Endocrine  Serology  Results Review (optional):1}   Objective    There were no vitals taken for this visit. {Insert last BP/Wt (optional):23777}{See vitals history (optional):1}   Physical Exam Vitals reviewed.  Constitutional:      General: She is not in acute distress.    Appearance: Normal appearance. She is well-developed. She is not diaphoretic.  HENT:     Head: Normocephalic and atraumatic.  Eyes:     General: No scleral icterus.    Conjunctiva/sclera: Conjunctivae normal.  Neck:     Thyroid: No thyromegaly.  Cardiovascular:     Rate and Rhythm: Normal rate and regular rhythm.     Pulses: Normal pulses.     Heart sounds: Normal heart sounds. No murmur heard. Pulmonary:     Effort: Pulmonary effort is normal. No respiratory distress.     Breath sounds: Normal breath sounds. No wheezing, rhonchi or rales.  Musculoskeletal:     Cervical back: Neck supple.     Right lower leg: No edema.     Left lower leg: No edema.  Lymphadenopathy:     Cervical: No cervical adenopathy.  Skin:    General: Skin is warm and dry.     Findings: No rash.  Neurological:     Mental Status: She is alert and oriented to person, place, and time. Mental status is at baseline.  Psychiatric:        Mood and Affect: Mood  normal.        Behavior: Behavior normal.      No results found for any visits on 09/23/23.      Assessment and Plan             No orders of the defined types were placed in this encounter.   No follow-ups on file.   The patient was advised to call back or seek an in-person evaluation if the symptoms worsen or if the condition fails to improve as anticipated.  I discussed the assessment and treatment plan with the patient. The patient was provided an opportunity to ask questions and all were answered. The patient agreed with the plan and demonstrated an understanding of the instructions.  I, Debera Lat, PA-C have reviewed all documentation for this visit. The documentation on 09/23/2023  for the exam, diagnosis, procedures, and orders are all accurate and complete.  Debera Lat, [facility], MMS [facility] [phone] (phone) [phone] (fax)  [facility]

## 2023-09-23 ENCOUNTER — Encounter: Payer: Self-pay | Admitting: Physician Assistant

## 2023-09-23 ENCOUNTER — Other Ambulatory Visit (HOSPITAL_COMMUNITY)
Admission: RE | Admit: 2023-09-23 | Discharge: 2023-09-23 | Disposition: A | Discharge status: Home / Self Care | Source: Ambulatory Visit | Attending: Physician Assistant | Admitting: Physician Assistant

## 2023-09-23 ENCOUNTER — Ambulatory Visit: Admitting: Physician Assistant

## 2023-09-23 ENCOUNTER — Ambulatory Visit

## 2023-09-23 ENCOUNTER — Ambulatory Visit: Payer: Medicaid Other | Admitting: Physician Assistant

## 2023-09-23 VITALS — BP 118/73 | HR 67 | Resp 16 | Ht 62.0 in | Wt 225.0 lb

## 2023-09-23 DIAGNOSIS — N898 Other specified noninflammatory disorders of vagina: Secondary | ICD-10-CM | POA: Diagnosis not present

## 2023-09-23 DIAGNOSIS — Z202 Contact with and (suspected) exposure to infections with a predominantly sexual mode of transmission: Secondary | ICD-10-CM | POA: Diagnosis not present

## 2023-09-23 DIAGNOSIS — N921 Excessive and frequent menstruation with irregular cycle: Secondary | ICD-10-CM | POA: Diagnosis not present

## 2023-09-23 DIAGNOSIS — R350 Frequency of micturition: Secondary | ICD-10-CM | POA: Diagnosis not present

## 2023-09-23 DIAGNOSIS — M2550 Pain in unspecified joint: Secondary | ICD-10-CM | POA: Diagnosis not present

## 2023-09-23 DIAGNOSIS — E7849 Other hyperlipidemia: Secondary | ICD-10-CM

## 2023-09-23 DIAGNOSIS — E049 Nontoxic goiter, unspecified: Secondary | ICD-10-CM

## 2023-09-23 DIAGNOSIS — R7303 Prediabetes: Secondary | ICD-10-CM

## 2023-09-23 DIAGNOSIS — E669 Obesity, unspecified: Secondary | ICD-10-CM

## 2023-09-23 DIAGNOSIS — R202 Paresthesia of skin: Secondary | ICD-10-CM

## 2023-09-23 DIAGNOSIS — I10 Essential (primary) hypertension: Secondary | ICD-10-CM

## 2023-09-23 DIAGNOSIS — H539 Unspecified visual disturbance: Secondary | ICD-10-CM

## 2023-09-23 DIAGNOSIS — E782 Mixed hyperlipidemia: Secondary | ICD-10-CM

## 2023-09-23 LAB — POCT URINALYSIS DIPSTICK
Blood, UA: NEGATIVE
Glucose, UA: NEGATIVE
Leukocytes, UA: NEGATIVE
Nitrite, UA: NEGATIVE
Protein, UA: NEGATIVE
Spec Grav, UA: 1.02 (ref 1.010–1.025)
Urobilinogen, UA: 0.2 U/dL
pH, UA: 5 (ref 5.0–8.0)

## 2023-09-24 LAB — CERVICOVAGINAL ANCILLARY ONLY
Bacterial Vaginitis (gardnerella): NEGATIVE
Candida Glabrata: NEGATIVE
Candida Vaginitis: NEGATIVE
Chlamydia: NEGATIVE
Comment: NEGATIVE
Comment: NEGATIVE
Comment: NEGATIVE
Comment: NEGATIVE
Comment: NEGATIVE
Comment: NORMAL
Neisseria Gonorrhea: NEGATIVE
Trichomonas: NEGATIVE

## 2023-09-26 ENCOUNTER — Telehealth: Admitting: Family Medicine

## 2023-09-26 NOTE — Progress Notes (Signed)
 Pt signed up for herself for her daughters visit- she will go back in and sign up for her daughter. DWB

## 2023-09-27 LAB — CBC WITH DIFFERENTIAL/PLATELET
Basophils Absolute: 0.1 x10E3/uL (ref 0.0–0.2)
Basos: 1 %
EOS (ABSOLUTE): 0.1 x10E3/uL (ref 0.0–0.4)
Eos: 1 %
Hematocrit: 41.7 % (ref 34.0–46.6)
Hemoglobin: 13.1 g/dL (ref 11.1–15.9)
Immature Grans (Abs): 0 x10E3/uL (ref 0.0–0.1)
Immature Granulocytes: 0 %
Lymphocytes Absolute: 2.1 x10E3/uL (ref 0.7–3.1)
Lymphs: 30 %
MCH: 27.7 pg (ref 26.6–33.0)
MCHC: 31.4 g/dL — ABNORMAL LOW (ref 31.5–35.7)
MCV: 88 fL (ref 79–97)
Monocytes Absolute: 0.6 x10E3/uL (ref 0.1–0.9)
Monocytes: 9 %
Neutrophils Absolute: 4.1 x10E3/uL (ref 1.4–7.0)
Neutrophils: 59 %
Platelets: 370 x10E3/uL (ref 150–450)
RBC: 4.73 x10E6/uL (ref 3.77–5.28)
RDW: 13.5 % (ref 11.7–15.4)
WBC: 6.9 x10E3/uL (ref 3.4–10.8)

## 2023-09-27 LAB — LIPID PANEL
Chol/HDL Ratio: 4.8 ratio — ABNORMAL HIGH (ref 0.0–4.4)
Cholesterol, Total: 201 mg/dL — ABNORMAL HIGH (ref 100–199)
HDL: 42 mg/dL
LDL Chol Calc (NIH): 139 mg/dL — ABNORMAL HIGH (ref 0–99)
Triglycerides: 110 mg/dL (ref 0–149)
VLDL Cholesterol Cal: 20 mg/dL (ref 5–40)

## 2023-09-27 LAB — COMPREHENSIVE METABOLIC PANEL WITH GFR
ALT: 40 IU/L — ABNORMAL HIGH (ref 0–32)
AST: 23 IU/L (ref 0–40)
Albumin: 4.2 g/dL (ref 3.9–4.9)
Alkaline Phosphatase: 66 IU/L (ref 44–121)
BUN/Creatinine Ratio: 14 (ref 9–23)
BUN: 10 mg/dL (ref 6–20)
Bilirubin Total: 0.3 mg/dL (ref 0.0–1.2)
CO2: 23 mmol/L (ref 20–29)
Calcium: 9.4 mg/dL (ref 8.7–10.2)
Chloride: 104 mmol/L (ref 96–106)
Creatinine, Ser: 0.73 mg/dL (ref 0.57–1.00)
Globulin, Total: 3.2 g/dL (ref 1.5–4.5)
Glucose: 101 mg/dL — ABNORMAL HIGH (ref 70–99)
Potassium: 4.7 mmol/L (ref 3.5–5.2)
Sodium: 143 mmol/L (ref 134–144)
Total Protein: 7.4 g/dL (ref 6.0–8.5)
eGFR: 109 mL/min/1.73

## 2023-09-27 LAB — HEMOGLOBIN A1C
Est. average glucose Bld gHb Est-mCnc: 111 mg/dL
Hgb A1c MFr Bld: 5.5 % (ref 4.8–5.6)

## 2023-09-27 LAB — RPR, QUANT+TP ABS (REFLEX)
Rapid Plasma Reagin, Quant: 1:2 {titer} — ABNORMAL HIGH
T Pallidum Abs: NONREACTIVE

## 2023-09-27 LAB — SYPHILIS: RPR W/REFLEX TO RPR TITER AND TREPONEMAL ANTIBODIES, TRADITIONAL SCREENING AND DIAGNOSIS ALGORITHM: RPR Ser Ql: REACTIVE — AB

## 2023-09-27 LAB — HIV ANTIBODY (ROUTINE TESTING W REFLEX): HIV Screen 4th Generation wRfx: NONREACTIVE

## 2023-09-27 LAB — RHEUMATOID FACTOR: Rheumatoid fact SerPl-aCnc: 11.1 [IU]/mL

## 2023-09-27 LAB — ANA W/REFLEX IF POSITIVE: Anti Nuclear Antibody (ANA): NEGATIVE

## 2023-09-29 ENCOUNTER — Encounter: Payer: Self-pay | Admitting: Physician Assistant

## 2023-10-23 ENCOUNTER — Telehealth: Admitting: Family Medicine

## 2023-10-23 DIAGNOSIS — R21 Rash and other nonspecific skin eruption: Secondary | ICD-10-CM | POA: Diagnosis not present

## 2023-10-23 MED ORDER — TRIAMCINOLONE ACETONIDE 0.025 % EX OINT: 1.0000 | TOPICAL_OINTMENT | Freq: Two times a day (BID) | CUTANEOUS | 0 refills | Status: DC

## 2023-10-23 MED ORDER — PREDNISONE 10 MG (21) PO TBPK: ORAL_TABLET | ORAL | 0 refills | Status: DC

## 2023-10-23 NOTE — Progress Notes (Signed)
 Virtual Visit Consent   [name], you are scheduled for a virtual visit with a [facility] provider today. Just as with appointments in the office, your consent must be obtained to participate. Your consent will be active for this visit and any virtual visit you may have with one of our providers in the next 365 days. If you have a MyChart account, a copy of this consent can be sent to you electronically.  As this is a virtual visit, video technology does not allow for your provider to perform a traditional examination. This may limit your provider's ability to fully assess your condition. If your provider identifies any concerns that need to be evaluated in person or the need to arrange testing (such as labs, EKG, etc.), we will make arrangements to do so. Although advances in technology are sophisticated, we cannot ensure that it will always work on either your end or our end. If the connection with a video visit is poor, the visit may have to be switched to a telephone visit. With either a video or telephone visit, we are not always able to ensure that we have a secure connection.  By engaging in this virtual visit, you consent to the provision of healthcare and authorize for your insurance to be billed (if applicable) for the services provided during this visit. Depending on your insurance coverage, you may receive a charge related to this service.  I need to obtain your verbal consent now. Are you willing to proceed with your visit today? [name] has provided verbal consent on 10/23/2023 for a virtual visit (video or telephone). Freddy Finner, NP  Date: 10/23/2023 11:39 AM   Virtual Visit via Video Note   I, Freddy Finner, connected with  Mariah Richardson  ([id number], 06-22-1986) on 10/23/23 at 11:45 AM EDT by a video-enabled telemedicine application and verified that I am speaking with the correct person using two identifiers.  Location: Patient: Virtual Visit Location  Patient: Home Provider: Virtual Visit Location Provider: Home Office   I discussed the limitations of evaluation and management by telemedicine and the availability of in person appointments. The patient expressed understanding and agreed to proceed.    History of Present Illness: ABI Richardson is a 38 y.o. who identifies as a female who was assigned female at birth, and is being seen today for hives / rash  Onset was last week -itching, patches of red slightly raised.  Got new tattoos and then the rash started up, reports the week prior she has nipple piercing's and did not have trouble then- with the skin cleaner. Modifying factors are applying Aquaphor and benadryl  Denies chest pain, shortness of breath, fevers, chills, blistering.  Problems:  Patient Active Problem List   Diagnosis Date Noted   Mixed hyperlipidemia 06/28/2023   Prediabetes 06/28/2023   Obesity, class 3 03/08/2022   Sciatica of right side 01/29/2022   Supervision of high risk pregnancy, antepartum 11/06/2020   Chronic migraine 07/30/2019   History of marijuana use 06/30/2017   Biological false positive RPR test 02/28/2017   Anxiety 10/03/2015    Allergies:  Allergies  Allergen Reactions   Amoxicillin Hives   Latex    Penicillins Hives   Medications:  Current Outpatient Medications:    metFORMIN (GLUCOPHAGE) 500 MG tablet, Take 1 tablet (500 mg total) by mouth daily with breakfast. (Patient not taking: Reported on 09/23/2023), Disp: 90 tablet, Rfl: 3   ondansetron (ZOFRAN) 4 MG tablet, Take  1 tablet (4 mg total) by mouth every 8 (eight) hours as needed for nausea or vomiting., Disp: 20 tablet, Rfl: 0  Observations/Objective: Patient is well-developed, well-nourished in no acute distress.  Resting comfortably  at home.  Head is normocephalic, atraumatic.  No labored breathing.  Speech is clear and coherent with logical content.  Patient is alert and oriented at baseline.  Slightly raised red rash  patches across stomach   Assessment and Plan:  1. Rash (Primary)  - predniSONE (STERAPRED UNI-PAK 21 TAB) 10 MG (21) TBPK tablet; Take as directed  Dispense: 21 tablet; Refill: 0 - triamcinolone (KENALOG) 0.025 % ointment; Apply 1 Application topically 2 (two) times daily.  Dispense: 30 g; Refill: 0  -keep area clean -apply ointment to help with itching -take medication as directed  -follow up if not improving   Reviewed side effects, risks and benefits of medication.    Patient acknowledged agreement and understanding of the plan.   Past Medical, Surgical, Social History, Allergies, and Medications have been Reviewed.    Follow Up Instructions: I discussed the assessment and treatment plan with the patient. The patient was provided an opportunity to ask questions and all were answered. The patient agreed with the plan and demonstrated an understanding of the instructions.  A copy of instructions were sent to the patient via MyChart unless otherwise noted below.    The patient was advised to call back or seek an in-person evaluation if the symptoms worsen or if the condition fails to improve as anticipated.    Freddy Finner, NP

## 2023-10-23 NOTE — Patient Instructions (Addendum)
  Mariah Richardson, thank you for joining Freddy Finner, NP for today's virtual visit.  While this provider is not your primary care provider (PCP), if your PCP is located in our provider database this encounter information will be shared with them immediately following your visit.   A [facility] MyChart account gives you access to today's visit and all your visits, tests, and labs performed at [facility] " click here if you don't have a [facility] MyChart account or go to [email or web address]  Consent: (Patient) Mariah Richardson provided verbal consent for this virtual visit at the beginning of the encounter.  Current Medications:  Current Outpatient Medications:    predniSONE (STERAPRED UNI-PAK 21 TAB) 10 MG (21) TBPK tablet, Take as directed, Disp: 21 tablet, Rfl: 0   triamcinolone (KENALOG) 0.025 % ointment, Apply 1 Application topically 2 (two) times daily., Disp: 30 g, Rfl: 0   metFORMIN (GLUCOPHAGE) 500 MG tablet, Take 1 tablet (500 mg total) by mouth daily with breakfast. (Patient not taking: Reported on 09/23/2023), Disp: 90 tablet, Rfl: 3   ondansetron (ZOFRAN) 4 MG tablet, Take 1 tablet (4 mg total) by mouth every 8 (eight) hours as needed for nausea or vomiting., Disp: 20 tablet, Rfl: 0   Medications ordered in this encounter:  Meds ordered this encounter  Medications   predniSONE (STERAPRED UNI-PAK 21 TAB) 10 MG (21) TBPK tablet    Sig: Take as directed    Dispense:  21 tablet    Refill:  0    Supervising Provider:   Merrilee Jansky [[id number]]   triamcinolone (KENALOG) 0.025 % ointment    Sig: Apply 1 Application topically 2 (two) times daily.    Dispense:  30 g    Refill:  0    Supervising Provider:   Merrilee Jansky [[id number]]     *If you need refills on other medications prior to your next appointment, please contact your pharmacy*  Follow-Up: Call back or seek an in-person evaluation if the symptoms worsen or if the condition fails to improve  as anticipated.  [facility] [phone]  Other Instructions  -keep area clean -apply ointment to help with itching -take medication as directed  -follow up if not improving   If you have been instructed to have an in-person evaluation today at a local Urgent Care facility, please use the link below. It will take you to a list of all of our available [facility], including address, phone number and hours of operation. Please do not delay care.  [facility]  If you or a family member do not have a primary care provider, use the link below to schedule a visit and establish care. When you choose a [facility] primary care physician or advanced practice provider, you gain a long-term partner in health. Find a Primary Care Provider  Learn more about [facility]'s in-office and virtual care options: [facility] Get Care Now

## 2023-11-03 ENCOUNTER — Telehealth

## 2023-11-03 ENCOUNTER — Encounter: Payer: Self-pay | Admitting: Physician Assistant

## 2023-11-06 ENCOUNTER — Telehealth

## 2023-11-06 MED ORDER — SEMAGLUTIDE-WEIGHT MANAGEMENT 0.5 MG/0.5ML ~~LOC~~ SOAJ: 0.5000 mg | SUBCUTANEOUS | 1 refills | Status: AC

## 2023-11-06 MED ORDER — SEMAGLUTIDE-WEIGHT MANAGEMENT 0.25 MG/0.5ML ~~LOC~~ SOAJ: 0.2500 mg | SUBCUTANEOUS | 0 refills | Status: AC

## 2023-11-07 ENCOUNTER — Other Ambulatory Visit (HOSPITAL_COMMUNITY): Payer: Self-pay

## 2023-11-07 ENCOUNTER — Telehealth: Payer: Self-pay

## 2023-11-07 ENCOUNTER — Telehealth

## 2023-11-07 NOTE — Telephone Encounter (Signed)
 Pharmacy Patient Advocate Encounter   Received notification from [employer] that prior authorization for Wegovy  0.25MG /0.5ML auto-injectors is required/requested.   Insurance verification completed.   The patient is insured through [facility] .   Per test claim: PA required; PA submitted to above mentioned insurance via CoverMyMeds Key/confirmation #/EOC  [id number] Status is pending

## 2023-11-08 ENCOUNTER — Encounter: Payer: Self-pay | Admitting: Emergency Medicine

## 2023-11-08 ENCOUNTER — Other Ambulatory Visit: Payer: Self-pay

## 2023-11-08 ENCOUNTER — Emergency Department
Admission: EM | Admit: 2023-11-08 | Discharge: 2023-11-08 | Disposition: A | Discharge status: Home / Self Care | Attending: Emergency Medicine | Admitting: Emergency Medicine

## 2023-11-08 DIAGNOSIS — R21 Rash and other nonspecific skin eruption: Secondary | ICD-10-CM | POA: Diagnosis not present

## 2023-11-08 MED ORDER — CEFADROXIL 500 MG PO CAPS: 500.0000 mg | ORAL_CAPSULE | Freq: Two times a day (BID) | ORAL | 0 refills | Status: AC

## 2023-11-08 MED ORDER — MUPIROCIN 2 % EX OINT: 1.0000 | TOPICAL_OINTMENT | Freq: Two times a day (BID) | CUTANEOUS | 0 refills | Status: DC

## 2023-11-08 MED ORDER — CEFADROXIL 500 MG PO CAPS: 500.0000 mg | ORAL_CAPSULE | Freq: Two times a day (BID) | ORAL | 0 refills | Status: DC

## 2023-11-08 MED ORDER — FLUCONAZOLE 150 MG PO TABS: 150.0000 mg | ORAL_TABLET | ORAL | 0 refills | Status: AC

## 2023-11-08 NOTE — ED Provider Notes (Signed)
 [facility] Provider Note    Event Date/Time   First MD Initiated Contact with Patient 11/08/23 [phone]     (approximate)   History   Rash   HPI Mariah Richardson is a 38 y.o. female who presents for evaluation of a rash.  She said that about a month ago she got a tattoo in the middle of her abdomen beneath her breasts and shortly thereafter developed a rash on the tattoo but which is spread out to the areas on her rib cage beneath her breasts.  There is also rash on her breasts themselves.  It has been going on about a month but it has been spreading and getting worse.  It is itching and sometimes burning.  She had a virtual appointment with a COVID health provider a couple of weeks ago and was prescribed a short course of prednisone  and triamcinolone  ointment, but she said it had no effect, and has continued to get a little bit worse.  She has had no systemic symptoms including no fever and has had no oral lesions.  No rash anywhere else on her body, just on her torso including breast and the area beneath the breast.     Physical Exam   Triage Vital Signs: ED Triage Vitals  Encounter Vitals Group     BP 11/08/23 0024 (!) 110/55     Systolic BP Percentile --      Diastolic BP Percentile --      Pulse Rate 11/08/23 0024 74     Resp 11/08/23 0024 18     Temp 11/08/23 0024 98.5 F (36.9 C)     Temp Source 11/08/23 0024 Oral     SpO2 11/08/23 0024 100 %     Weight 11/08/23 0025 102 kg (224 lb 13.9 oz)     Height 11/08/23 0025 1.575 m (5\' 2" )     Head Circumference --      Peak Flow --      Pain Score --      Pain Loc --      Pain Education --      Exclude from Growth Chart --     Most recent vital signs: Vitals:   11/08/23 0024 11/08/23 0224  BP: (!) 110/55 116/75  Pulse: 74 85  Resp: 18 16  Temp: 98.5 F (36.9 C) 98.1 F (36.7 C)  SpO2: 100% 99%    General: Awake, no distress.  CV:  Good peripheral perfusion.  Resp:  Normal effort. Speaking  easily and comfortably, no accessory muscle usage nor intercostal retractions.   Abd:  No distention.  Other:  Patient has thickened skin in the intertriginous regions beneath her breasts, bilateral, with some erythema (adjusted for her natural skin color) and with maculopapular spots scattered around that appeared somewhat consistent with satellite lesions 1 would see in candidiasis.  He is not particularly warm or tender to the touch.  Not all of the affected areas are intertriginous, but the majority are.   ED Results / Procedures / Treatments   Labs (all labs ordered are listed, but only abnormal results are displayed) Labs Reviewed - No data to display     PROCEDURES:  Critical Care performed: No  Procedures    IMPRESSION / MDM / ASSESSMENT AND PLAN / ED COURSE  I reviewed the triage vital signs and the nursing notes.  Differential diagnosis includes, but is not limited to, candidiasis, cellulitis, nonspecific inflammation.  Patient's presentation is most consistent with acute, uncomplicated illness.    No evidence of any acute or emergent severe potentially life-threatening rash or drug reaction.  Does not appear consistent with an allergy.  Given that she has tried a course of prednisone  and triamcinolone  with no effect, I will treat both for possible superficial bacterial infection and candidiasis, which I suspect is more likely.  I talked with her about it and we decided to try both oral and topical medications, including the prescription as listed below as well as over-the-counter clotrimazole ointments which she will mix with the mupirocin .  I strongly encouraged close follow-up with dermatology.  She understands and agrees with the plan.  The patient's medical screening exam is reassuring with no indication of an emergent medical condition requiring hospitalization or additional evaluation at this point.  The patient is safe and appropriate for  discharge and outpatient follow up.         FINAL CLINICAL IMPRESSION(S) / ED DIAGNOSES   Final diagnoses:  Rash     Rx / DC Orders   ED Discharge Orders          Ordered    cefadroxil  (DURICEF) 500 MG capsule  2 times daily,   Status:  Discontinued        11/08/23 0212    fluconazole  (DIFLUCAN ) 150 MG tablet  Every 48 hours        11/08/23 0212    mupirocin  ointment (BACTROBAN ) 2 %  2 times daily        11/08/23 0212    cefadroxil  (DURICEF) 500 MG capsule  2 times daily        11/08/23 0865             Note:  This document was prepared using Dragon voice recognition software and may include unintentional dictation errors.   Lynnda Sas, MD 11/08/23 [phone]

## 2023-11-08 NOTE — ED Notes (Addendum)
 Assisted as chaperone with Dr. Lajuana Pilar while he completed review of rash under the pts breast and chest.

## 2023-11-08 NOTE — Discharge Instructions (Signed)
 Please try using the 2 oral medications prescribed according to the label instructions.  These should help fight against both bacterial infections and fungal infections.  Additionally, use the prescribed antibiotic ointment (mupirocin ) mixed with an equal amount of over-the-counter antifungal ointment such as clotrimazole.  We recommend you call a local dermatologist such as the  skin center to make the next available follow-up appointment.  Return to the emergency department if you develop new or worsening symptoms that concern you.

## 2023-11-08 NOTE — ED Triage Notes (Signed)
 Patient C/O painful, itchy rash on trunk, under breasts, and bilateral shins after getting tattoos a month ago. Patient denies any new soaps, detergents, lotions, etc. She states that she has taken benadryl  several times and used topical creams to try and help and has had no relief. Patient denies any fevers, nausea, vomiting or any other infectious symptoms.

## 2023-11-10 ENCOUNTER — Other Ambulatory Visit (HOSPITAL_COMMUNITY): Payer: Self-pay

## 2023-11-10 NOTE — Telephone Encounter (Signed)
 Pharmacy Patient Advocate Encounter  Received notification from [facility] that Prior Authorization for Wegovy  0.25MG /0.5ML auto-injectors has been APPROVED from 11/07/23 to 05/05/24. Ran test claim, Copay is $4. This test claim was processed through [facility]- copay amounts may vary at other pharmacies due to pharmacy/plan contracts, or as the patient moves through the different stages of their insurance plan.   PA #/Case ID/Reference #: [id number]

## 2023-11-26 ENCOUNTER — Telehealth: Admitting: Physician Assistant

## 2023-11-26 ENCOUNTER — Telehealth: Admitting: Family Medicine

## 2023-11-26 DIAGNOSIS — R112 Nausea with vomiting, unspecified: Secondary | ICD-10-CM

## 2023-11-26 DIAGNOSIS — T887XXA Unspecified adverse effect of drug or medicament, initial encounter: Secondary | ICD-10-CM

## 2023-11-26 DIAGNOSIS — G43911 Migraine, unspecified, intractable, with status migrainosus: Secondary | ICD-10-CM

## 2023-11-26 MED ORDER — ONDANSETRON 4 MG PO TBDP: 4.0000 mg | ORAL_TABLET | Freq: Three times a day (TID) | ORAL | 1 refills | Status: DC | PRN

## 2023-11-26 NOTE — Patient Instructions (Signed)
 Mariah Richardson, thank you for joining Angelia Kelp, PA-C for today's virtual visit.  While this provider is not your primary care provider (PCP), if your PCP is located in our provider database this encounter information will be shared with them immediately following your visit.   A [facility] MyChart account gives you access to today's visit and all your visits, tests, and labs performed at [facility] " click here if you don't have a [facility] MyChart account or go to [email or web address]  Consent: (Patient) Mariah Richardson provided verbal consent for this virtual visit at the beginning of the encounter.  Current Medications:  Current Outpatient Medications:    metFORMIN  (GLUCOPHAGE ) 500 MG tablet, Take 1 tablet (500 mg total) by mouth daily with breakfast. (Patient not taking: Reported on 09/23/2023), Disp: 90 tablet, Rfl: 3   mupirocin  ointment (BACTROBAN ) 2 %, Apply 1 Application topically 2 (two) times daily., Disp: 22 g, Rfl: 0   ondansetron  (ZOFRAN -ODT) 4 MG disintegrating tablet, Take 1 tablet (4 mg total) by mouth every 8 (eight) hours as needed., Disp: 20 tablet, Rfl: 1   predniSONE  (STERAPRED UNI-PAK 21 TAB) 10 MG (21) TBPK tablet, Take as directed, Disp: 21 tablet, Rfl: 0   Semaglutide -Weight Management 0.25 MG/0.5ML SOAJ, Inject 0.25 mg into the skin once a week for 28 days., Disp: 2 mL, Rfl: 0   [START ON 12/05/2023] Semaglutide -Weight Management 0.5 MG/0.5ML SOAJ, Inject 0.5 mg into the skin once a week for 28 days., Disp: 2 mL, Rfl: 1   triamcinolone  (KENALOG ) 0.025 % ointment, Apply 1 Application topically 2 (two) times daily., Disp: 30 g, Rfl: 0   Medications ordered in this encounter:  Meds ordered this encounter  Medications   ondansetron  (ZOFRAN -ODT) 4 MG disintegrating tablet    Sig: Take 1 tablet (4 mg total) by mouth every 8 (eight) hours as needed.    Dispense:  20 tablet    Refill:  1    Supervising Provider:   LAMPTEY, PHILIP O  [phone]     *If you need refills on other medications prior to your next appointment, please contact your pharmacy*  Follow-Up: Call back or seek an in-person evaluation if the symptoms worsen or if the condition fails to improve as anticipated.  [facility] [phone]  Other Instructions Semaglutide  Injection (Weight Management) What is this medication? SEMAGLUTIDE  (SEM a GLOO tide) promotes weight loss. It may also be used to maintain weight loss.  It works by decreasing appetite. It can be used to lower the risk of heart attack and stroke in people affected by excess weight. Changes to diet and exercise are often combined with this medication. This medicine may be used for other purposes; ask your health care provider or pharmacist if you have questions. COMMON BRAND NAME(S): Wegovy  What should I tell my care team before I take this medication? They need to know if you have any of these conditions: Diabetes Eye disease caused by diabetes Gallbladder disease Have or have had depression Have or have had pancreatitis Having surgery Kidney disease Personal or family history of MEN 2, a condition that causes endocrine gland tumors Personal or family history of thyroid  cancer Stomach or intestine problems, such as problems digesting food Suicidal thoughts, plans, or attempt An unusual or allergic reaction to semaglutide , other medications, foods, dyes, or preservatives Pregnant or trying to get pregnant Breastfeeding How should I use this medication? This medication is injected under the skin. You will be taught how  to prepare and give it. Take it as directed on the prescription label. It is given once every week (every 7 days). Keep taking it unless your care team tells you to stop. It is important that you put your used needles and pens in a special sharps container. Do not put them in a trash can. If you do not have a sharps container, call your pharmacist or care  team to get one. A special MedGuide will be given to you by the pharmacist with each prescription and refill. Be sure to read this information carefully each time. This medication comes with INSTRUCTIONS FOR USE. Ask your pharmacist for directions on how to use this medication. Read the information carefully. Talk to your pharmacist or care team if you have questions. Talk to your care team about the use of this medication in children. While it may be prescribed for children as young as 12 years for selected conditions, precautions do apply. Overdosage: If you think you have taken too much of this medicine contact a poison control center or emergency room at once. NOTE: This medicine is only for you. Do not share this medicine with others. What if I miss a dose? If you miss a dose and the next scheduled dose is more than 2 days away, take the missed dose as soon as possible. If you miss a dose and the next scheduled dose is less than 2 days away, do not take the missed dose. Take the next dose at your regular time. Do not take double or extra doses. If you miss your dose for 2 weeks or more, take the next dose at your regular time or call your care team to talk about how to restart this medication. What may interact with this medication? Insulin and other medications for diabetes This list may not describe all possible interactions. Give your health care provider a list of all the medicines, herbs, non-prescription drugs, or dietary supplements you use. Also tell them if you smoke, drink alcohol, or use illegal drugs. Some items may interact with your medicine. What should I watch for while using this medication? Visit your care team for regular checks on your progress. Tell your care team if your condition does not start to get better or if it gets worse. Tell your care team if you are taking medication to treat diabetes, such as insulin or glipizide. This may increase your risk of low blood sugar.  Know the symptoms of low blood sugar and how to treat it. Talk to your care team about your risk of cancer. You may be more at risk for certain types of cancer if you take this medication. Talk to your care team right away if you have a lump or swelling in your neck, hoarseness that does not go away, trouble swallowing, shortness of breath, or trouble breathing. Make sure you stay hydrated while taking this medication. Drink water often. Eat fruits and veggies that have a high water content. Drink more water when it is hot or you are active. Talk to your care team right away if you have fever, infection, vomiting, diarrhea, or if you sweat a lot while taking this medication. The loss of too much body fluid may make it dangerous for you to take this medication. If you are going to need surgery or a procedure, tell your care team that you are taking this medication. What side effects may I notice from receiving this medication? Side effects that you should report to  your care team as soon as possible: Allergic reactions--skin rash, itching, hives, swelling of the face, lips, tongue, or throat Change in vision Dehydration--increased thirst, dry mouth, feeling faint or lightheaded, headache, dark yellow or brown urine Gallbladder problems--severe stomach pain, nausea, vomiting, fever Heart palpitations--rapid, pounding, or irregular heartbeat Kidney injury--decrease in the amount of urine, swelling of the ankles, hands, or feet Pancreatitis--severe stomach pain that spreads to your back or gets worse after eating or when touched, fever, nausea, vomiting Thoughts of suicide or self-harm, worsening mood, feelings of depression Thyroid  cancer--new mass or lump in the neck, pain or trouble swallowing, trouble breathing, hoarseness Side effects that usually do not require medical attention (report these to your care team if they continue or are bothersome): Diarrhea Loss of appetite Nausea Upset  stomach This list may not describe all possible side effects. Call your doctor for medical advice about side effects. You may report side effects to FDA at 1-800-FDA-1088. Where should I keep my medication? Keep out of the reach of children and pets. Refrigeration (preferred): Store in the refrigerator. Do not freeze. Keep this medication in the original container until you are ready to take it. Get rid of any unused medication after the expiration date. Room temperature: If needed, prior to cap removal, the pen can be stored at room temperature for up to 28 days. Protect from light. If it is stored at room temperature, get rid of any unused medication after 28 days or after it expires, whichever is first. It is important to get rid of the medication as soon as you no longer need it or it is expired. You can do this in two ways: Take the medication to a medication take-back program. Check with your pharmacy or law enforcement to find a location. If you cannot return the medication, follow the directions in the MedGuide. NOTE: This sheet is a summary. It may not cover all possible information. If you have questions about this medicine, talk to your doctor, pharmacist, or health care provider.  2024 Elsevier/Gold Standard (2023-06-20 00:00:00)   If you have been instructed to have an in-person evaluation today at a local Urgent Care facility, please use the link below. It will take you to a list of all of our available [facility], including address, phone number and hours of operation. Please do not delay care.  [facility]  If you or a family member do not have a primary care provider, use the link below to schedule a visit and establish care. When you choose a [facility] primary care physician or advanced practice provider, you gain a long-term partner in health. Find a Primary Care Provider  Learn more about [facility] in-office and virtual care options: [facility] - Get Care Now

## 2023-11-26 NOTE — Progress Notes (Signed)
 Virtual Visit Consent   Mariah Richardson, you are scheduled for a virtual visit with a [facility] provider today. Just as with appointments in the office, your consent must be obtained to participate. Your consent will be active for this visit and any virtual visit you may have with one of our providers in the next 365 days. If you have a MyChart account, a copy of this consent can be sent to you electronically.  As this is a virtual visit, video technology does not allow for your provider to perform a traditional examination. This may limit your provider's ability to fully assess your condition. If your provider identifies any concerns that need to be evaluated in person or the need to arrange testing (such as labs, EKG, etc.), we will make arrangements to do so. Although advances in technology are sophisticated, we cannot ensure that it will always work on either your end or our end. If the connection with a video visit is poor, the visit may have to be switched to a telephone visit. With either a video or telephone visit, we are not always able to ensure that we have a secure connection.  By engaging in this virtual visit, you consent to the provision of healthcare and authorize for your insurance to be billed (if applicable) for the services provided during this visit. Depending on your insurance coverage, you may receive a charge related to this service.  I need to obtain your verbal consent now. Are you willing to proceed with your visit today? Mariah Richardson has provided verbal consent on 11/26/2023 for a virtual visit (video or telephone). Angelia Kelp, PA-C  Date: 11/26/2023 10:29 AM   Virtual Visit via Video Note   I, Angelia Kelp, connected with  Mariah Richardson  ([id number], [birth year]) on 11/26/23 at 10:15 AM EDT by a video-enabled telemedicine application and verified that I am speaking with the correct person using two identifiers.  Location: Patient: Virtual Visit  Location Patient: Home Provider: Virtual Visit Location Provider: Home Office   I discussed the limitations of evaluation and management by telemedicine and the availability of in person appointments. The patient expressed understanding and agreed to proceed.    History of Present Illness: Mariah Richardson is a 38 y.o. who identifies as a female who was assigned female at birth, and is being seen today for nausea and vomiting.  HPI: Emesis  This is a new problem. The current episode started yesterday. The problem has been unchanged. The emesis has an appearance of stomach contents and bile. There has been no fever. Associated symptoms include abdominal pain, myalgias and weight loss. Pertinent negatives include no chills, diarrhea, dizziness, fever or sweats. Risk factors: wegovy . Treatments tried: zofran . The treatment provided no relief.   Nausea and vomiting from Wegovy , had last week, and now this week after shot. She is on lowest dose. This was her 3rd shot. First tolerated well, second and third have had nausea and vomiting significantly.    Problems:  Patient Active Problem List   Diagnosis Date Noted   Mixed hyperlipidemia 06/28/2023   Prediabetes 06/28/2023   Obesity, class 3 03/08/2022   Sciatica of right side 01/29/2022   Supervision of high risk pregnancy, antepartum 11/06/2020   Chronic migraine 07/30/2019   History of marijuana use 06/30/2017   Biological false positive RPR test 02/28/2017   Anxiety 10/03/2015    Allergies:  Allergies  Allergen Reactions   Amoxicillin  Hives   Latex  Penicillins Hives   Medications:  Current Outpatient Medications:    metFORMIN  (GLUCOPHAGE ) 500 MG tablet, Take 1 tablet (500 mg total) by mouth daily with breakfast. (Patient not taking: Reported on 09/23/2023), Disp: 90 tablet, Rfl: 3   mupirocin  ointment (BACTROBAN ) 2 %, Apply 1 Application topically 2 (two) times daily., Disp: 22 g, Rfl: 0   ondansetron  (ZOFRAN -ODT) 4 MG  disintegrating tablet, Take 1 tablet (4 mg total) by mouth every 8 (eight) hours as needed., Disp: 20 tablet, Rfl: 1   predniSONE  (STERAPRED UNI-PAK 21 TAB) 10 MG (21) TBPK tablet, Take as directed, Disp: 21 tablet, Rfl: 0   Semaglutide -Weight Management 0.25 MG/0.5ML SOAJ, Inject 0.25 mg into the skin once a week for 28 days., Disp: 2 mL, Rfl: 0   [START ON 12/05/2023] Semaglutide -Weight Management 0.5 MG/0.5ML SOAJ, Inject 0.5 mg into the skin once a week for 28 days., Disp: 2 mL, Rfl: 1   triamcinolone  (KENALOG ) 0.025 % ointment, Apply 1 Application topically 2 (two) times daily., Disp: 30 g, Rfl: 0  Observations/Objective: Patient is well-developed, well-nourished in no acute distress.  Resting comfortably at home.  Head is normocephalic, atraumatic.  No labored breathing.  Speech is clear and coherent with logical content.  Patient is alert and oriented at baseline.    Assessment and Plan: 1. Nausea and vomiting, unspecified vomiting type (Primary) - ondansetron  (ZOFRAN -ODT) 4 MG disintegrating tablet; Take 1 tablet (4 mg total) by mouth every 8 (eight) hours as needed.  Dispense: 20 tablet; Refill: 1  2. Medication side effects - ondansetron  (ZOFRAN -ODT) 4 MG disintegrating tablet; Take 1 tablet (4 mg total) by mouth every 8 (eight) hours as needed.  Dispense: 20 tablet; Refill: 1  - Zofran  for nausea - Push fluids, electrolyte beverages - Advised to not increase Wegovy  next week since not tolerating - Her choice if she wants to try one more week - If not tolerating, contact prescriber to determine if another choice  Follow Up Instructions: I discussed the assessment and treatment plan with the patient. The patient was provided an opportunity to ask questions and all were answered. The patient agreed with the plan and demonstrated an understanding of the instructions.  A copy of instructions were sent to the patient via MyChart unless otherwise noted below.    The patient was  advised to call back or seek an in-person evaluation if the symptoms worsen or if the condition fails to improve as anticipated.    Angelia Kelp, PA-C

## 2023-11-26 NOTE — Progress Notes (Signed)
  Because you have had a migraine for 3 days with on going vomiting you need to be seen in person to help get it under  control and stop the cycle. Your condition warrants further evaluation and I recommend that you be seen in a face-to-face visit.   NOTE: There will be NO CHARGE for this E-Visit   If you are having a true medical emergency, please call 911.

## 2023-12-02 ENCOUNTER — Ambulatory Visit

## 2023-12-07 ENCOUNTER — Encounter: Payer: Self-pay | Admitting: Physician Assistant

## 2023-12-08 NOTE — Telephone Encounter (Signed)
 Please see the message below. Pt was approved for only 2ml form 4/25 to 10/25 per her approval letter

## 2023-12-09 ENCOUNTER — Other Ambulatory Visit (HOSPITAL_COMMUNITY): Payer: Self-pay

## 2023-12-15 ENCOUNTER — Telehealth: Admitting: Physician Assistant

## 2023-12-15 DIAGNOSIS — R110 Nausea: Secondary | ICD-10-CM

## 2023-12-15 NOTE — Progress Notes (Signed)

## 2023-12-18 ENCOUNTER — Telehealth: Admitting: Physician Assistant

## 2023-12-18 DIAGNOSIS — T887XXA Unspecified adverse effect of drug or medicament, initial encounter: Secondary | ICD-10-CM

## 2023-12-18 DIAGNOSIS — R110 Nausea: Secondary | ICD-10-CM

## 2023-12-18 MED ORDER — ONDANSETRON 4 MG PO TBDP: 4.0000 mg | ORAL_TABLET | Freq: Three times a day (TID) | ORAL | 0 refills | Status: DC | PRN

## 2023-12-18 NOTE — Addendum Note (Signed)
 Addended by: Angelia Kelp on: 12/18/2023 09:05 AM   Modules accepted: Orders

## 2023-12-18 NOTE — Progress Notes (Signed)
 Medication had not been sent in accidentally. This has been corrected and EV is no charged.

## 2023-12-26 ENCOUNTER — Ambulatory Visit
Admission: RE | Admit: 2023-12-26 | Discharge: 2023-12-26 | Disposition: A | Discharge status: Home / Self Care | Source: Ambulatory Visit | Attending: Emergency Medicine | Admitting: Emergency Medicine

## 2023-12-26 VITALS — BP 101/71 | HR 116 | Temp 97.8°F | Ht 61.0 in | Wt 210.8 lb

## 2023-12-26 DIAGNOSIS — Z113 Encounter for screening for infections with a predominantly sexual mode of transmission: Secondary | ICD-10-CM

## 2023-12-26 NOTE — ED Triage Notes (Signed)
 Pt declines a pregnancy test and states she is not worried about pregnancy.

## 2023-12-26 NOTE — ED Triage Notes (Signed)
 Pt asks for STD panel to be done. Pt declines Syphilis testing and states that it always comes back "negative positive".   Pt Denies any new partners, but states that she does not trust her current partner.  Pt states that she is on Wegovy  and believes she has a yeast infection

## 2023-12-26 NOTE — Discharge Instructions (Addendum)
 Labs pending 2-3 days, you will be contacted if positive for any sti and treatment will be sent to the pharmacy, you will have to return to the clinic if positive for gonorrhea to receive treatment   Please refrain from having sex until labs results, if positive please refrain from having sex until treatment complete and symptoms resolve   If positive for  HIV, Chlamydia  gonorrhea or trichomoniasis please notify partner or partners so they may tested as well  Moving forward, it is recommended you use some form of protection against the transmission of sti infections  such as condoms or dental dams with each sexual encounter

## 2023-12-26 NOTE — ED Provider Notes (Signed)
 [facility]    CSN: [id number] Arrival date & time: 12/26/23  1252      History   Chief Complaint Chief Complaint  Patient presents with   SEXUALLY TRANSMITTED DISEASE    Full std panel - Entered by patient    HPI Mariah Richardson is a 38 y.o. female.   Patient presents for evaluation for routine STI testing.  Denies all symptoms.  Denies known exposure.  Past Medical History:  Diagnosis Date   Allergy    Gastritis    Hypertension     Patient Active Problem List   Diagnosis Date Noted   Mixed hyperlipidemia 06/28/2023   Prediabetes 06/28/2023   Obesity, class 3 03/08/2022   Sciatica of right side 01/29/2022   Supervision of high risk pregnancy, antepartum 11/06/2020   Chronic migraine 07/30/2019   History of marijuana use 06/30/2017   Biological false positive RPR test 02/28/2017   Anxiety 10/03/2015    Past Surgical History:  Procedure Laterality Date   APPENDECTOMY     CHOLECYSTECTOMY      OB History     Gravida  5   Para  4   Term  4   Preterm      AB  1   Living  4      SAB      IAB      Ectopic      Multiple  0   Live Births  4            Home Medications    Prior to Admission medications   Medication Sig Start Date End Date Taking? Authorizing Provider  metFORMIN  (GLUCOPHAGE ) 500 MG tablet Take 1 tablet (500 mg total) by mouth daily with breakfast. Patient not taking: Reported on 09/23/2023 08/26/23   Ostwalt, Janna, PA-C  mupirocin  ointment (BACTROBAN ) 2 % Apply 1 Application topically 2 (two) times daily. 11/08/23   Lynnda Sas, MD  ondansetron  (ZOFRAN -ODT) 4 MG disintegrating tablet Take 1 tablet (4 mg total) by mouth every 8 (eight) hours as needed. 12/18/23  Yes Burnette, Leory Rands, PA-C  predniSONE  (STERAPRED UNI-PAK 21 TAB) 10 MG (21) TBPK tablet Take as directed 10/23/23   Lanetta Pion, NP  Semaglutide -Weight Management 0.5 MG/0.5ML SOAJ Inject 0.5 mg into the skin once a week for 28 days. 12/05/23 01/02/24  Yes Ostwalt, Janna, PA-C  triamcinolone  (KENALOG ) 0.025 % ointment Apply 1 Application topically 2 (two) times daily. 10/23/23   Lanetta Pion, NP    Family History Family History  Problem Relation Age of Onset   Diabetes Mother    Heart disease Mother    Migraines Father    Diverticulitis Father    Cancer Maternal Grandmother    Stroke Maternal Grandmother    Heart disease Maternal Grandmother    Cancer Paternal Grandmother    Heart disease Paternal Grandfather    Stroke Paternal Grandfather    Hearing loss Paternal Grandfather     Social History Social History   Tobacco Use   Smoking status: Never   Smokeless tobacco: Never  Vaping Use   Vaping status: Never Used  Substance Use Topics   Alcohol use: No   Drug use: No     Allergies   Amoxicillin , Latex, and Penicillins   Review of Systems Review of Systems   Physical Exam Triage Vital Signs ED Triage Vitals  Encounter Vitals Group     BP 12/26/23 1258 101/71     Systolic BP Percentile --  Diastolic BP Percentile --      Pulse Rate 12/26/23 1258 (!) 116     Resp --      Temp 12/26/23 1258 97.8 F (36.6 C)     Temp Source 12/26/23 1258 Temporal     SpO2 12/26/23 1258 100 %     Weight 12/26/23 1300 210 lb 12.8 oz (95.6 kg)     Height 12/26/23 1300 5\' 1"  (1.549 m)     Head Circumference --      Peak Flow --      Pain Score 12/26/23 1300 0     Pain Loc --      Pain Education --      Exclude from Growth Chart --    No data found.  Updated Vital Signs BP 101/71 (BP Location: Left Arm)   Pulse (!) 116   Temp 97.8 F (36.6 C) (Temporal)   Ht 5\' 1"  (1.549 m)   Wt 210 lb 12.8 oz (95.6 kg)   LMP 12/11/2023   SpO2 100%   BMI 39.83 kg/m   Visual Acuity Right Eye Distance:   Left Eye Distance:   Bilateral Distance:    Right Eye Near:   Left Eye Near:    Bilateral Near:     Physical Exam Constitutional:      Appearance: Normal appearance.  Eyes:     Extraocular Movements: Extraocular  movements intact.  Pulmonary:     Effort: Pulmonary effort is normal.  Genitourinary:    Comments: deferred Neurological:     Mental Status: She is alert and oriented to person, place, and time.      UC Treatments / Results  Labs (all labs ordered are listed, but only abnormal results are displayed) Labs Reviewed  HIV ANTIBODY (ROUTINE TESTING W REFLEX)  CERVICOVAGINAL ANCILLARY ONLY    EKG   Radiology No results found.  Procedures Procedures (including critical care time)  Medications Ordered in UC Medications - No data to display  Initial Impression / Assessment and Plan / UC Course  I have reviewed the triage vital signs and the nursing notes.  Pertinent labs & imaging results that were available during my care of the patient were reviewed by me and considered in my medical decision making (see chart for details).  Routine screening for STI   STI labs pending will treat per protocol, declined syphilis testing, advised abstinence until lab results, and/or treatment is complete, advised condom use during all sexual encounters moving, may follow-up with urgent care as needed  Final Clinical Impressions(s) / UC Diagnoses   Final diagnoses:  Routine screening for STI (sexually transmitted infection)   Discharge Instructions      Labs pending 2-3 days, you will be contacted if positive for any sti and treatment will be sent to the pharmacy, you will have to return to the clinic if positive for gonorrhea to receive treatment   Please refrain from having sex until labs results, if positive please refrain from having sex until treatment complete and symptoms resolve   If positive for  HIV, Chlamydia  gonorrhea or trichomoniasis please notify partner or partners so they may tested as well  Moving forward, it is recommended you use some form of protection against the transmission of sti infections  such as condoms or dental dams with each sexual encounter    ED  Prescriptions   None    PDMP not reviewed this encounter.   Reena Canning, NP 12/26/23 1328

## 2023-12-27 LAB — HIV ANTIBODY (ROUTINE TESTING W REFLEX): HIV Screen 4th Generation wRfx: NONREACTIVE

## 2023-12-29 ENCOUNTER — Telehealth: Admitting: Physician Assistant

## 2023-12-29 ENCOUNTER — Ambulatory Visit (HOSPITAL_COMMUNITY): Payer: Self-pay

## 2023-12-29 DIAGNOSIS — N760 Acute vaginitis: Secondary | ICD-10-CM

## 2023-12-29 LAB — CERVICOVAGINAL ANCILLARY ONLY
Bacterial Vaginitis (gardnerella): POSITIVE — AB
Candida Glabrata: NEGATIVE
Candida Vaginitis: NEGATIVE
Chlamydia: NEGATIVE
Comment: NEGATIVE
Comment: NEGATIVE
Comment: NEGATIVE
Comment: NEGATIVE
Comment: NEGATIVE
Comment: NORMAL
Neisseria Gonorrhea: NEGATIVE
Trichomonas: NEGATIVE

## 2023-12-29 MED ORDER — FLUCONAZOLE 150 MG PO TABS: 150.0000 mg | ORAL_TABLET | Freq: Once | ORAL | 0 refills | Status: AC

## 2023-12-29 MED ORDER — METRONIDAZOLE 500 MG PO TABS: 500.0000 mg | ORAL_TABLET | Freq: Two times a day (BID) | ORAL | 0 refills | Status: AC

## 2023-12-29 NOTE — Progress Notes (Signed)
 I have spent 5 minutes in review of e-visit questionnaire, review and updating patient chart, medical decision making and response to patient.   Laure Kidney, PA-C

## 2023-12-29 NOTE — Progress Notes (Signed)

## 2024-01-05 ENCOUNTER — Telehealth: Admitting: Family Medicine

## 2024-01-05 DIAGNOSIS — R112 Nausea with vomiting, unspecified: Secondary | ICD-10-CM

## 2024-01-05 NOTE — Progress Notes (Signed)
  Because you are having on going/recurrent nausea and vomiting despite treatment, we feel your condition warrants further evaluation and we recommend that you be seen in a face-to-face visit at your PCP office or local urgent care.   NOTE: There will be NO CHARGE for this E-Visit   If you are having a true medical emergency, please call 911.

## 2024-01-08 ENCOUNTER — Ambulatory Visit: Payer: Self-pay

## 2024-01-08 ENCOUNTER — Ambulatory Visit

## 2024-01-08 ENCOUNTER — Other Ambulatory Visit (HOSPITAL_COMMUNITY)
Admission: RE | Admit: 2024-01-08 | Discharge: 2024-01-08 | Disposition: A | Discharge status: Home / Self Care | Source: Ambulatory Visit | Attending: Obstetrics | Admitting: Obstetrics

## 2024-01-08 ENCOUNTER — Telehealth

## 2024-01-08 VITALS — BP 94/70 | HR 76 | Ht 61.0 in | Wt 209.1 lb

## 2024-01-08 DIAGNOSIS — B9689 Other specified bacterial agents as the cause of diseases classified elsewhere: Secondary | ICD-10-CM | POA: Insufficient documentation

## 2024-01-08 DIAGNOSIS — N898 Other specified noninflammatory disorders of vagina: Secondary | ICD-10-CM | POA: Insufficient documentation

## 2024-01-08 DIAGNOSIS — N760 Acute vaginitis: Secondary | ICD-10-CM

## 2024-01-08 DIAGNOSIS — R300 Dysuria: Secondary | ICD-10-CM | POA: Diagnosis not present

## 2024-01-08 LAB — POCT URINALYSIS DIPSTICK
Bilirubin, UA: NEGATIVE
Blood, UA: NEGATIVE
Glucose, UA: NEGATIVE
Ketones, UA: NEGATIVE
Leukocytes, UA: NEGATIVE
Nitrite, UA: NEGATIVE
Protein, UA: NEGATIVE
Spec Grav, UA: 1.01 (ref 1.010–1.025)
Urobilinogen, UA: 0.2 U/dL
pH, UA: 8 (ref 5.0–8.0)

## 2024-01-08 MED ORDER — CLINDAMYCIN PHOSPHATE 100 MG VA SUPP: 100.0000 mg | Freq: Every day | VAGINAL | 0 refills | Status: DC

## 2024-01-08 NOTE — Addendum Note (Signed)
 Addended by: Quince Bryant on: 01/08/2024 03:20 PM   Modules accepted: Orders

## 2024-01-08 NOTE — Telephone Encounter (Signed)
 FYI Only or Action Required?: Action required by provider: clinical question for provider.  Patient was last seen in primary care on 10/23/2023 by Lanetta Pion, NP. Called Nurse Triage reporting Vaginal Discharge. Symptoms began yesterday. Interventions attempted: Prescription medications: Flagyl , BV cream. Symptoms are: gradually worsening.  Triage Disposition: See PCP When Office is Open (Within 3 Days)  Patient/caregiver understands and will follow disposition?: No, wishes to speak with PCP  ** A follow-up appt. Was offered, however there is no availability until July, patient would like to discuss persistent/worsening symptoms of BV with PCP, she also stated she will reach out to her OBGYN provider**                 Copied from CRM [id number]. Topic: Clinical - Red Word Triage >> Jan 08, 2024  9:49 AM Rosamond Comes wrote: Red Word that prompted transfer to Nurse Triage: patient diagnosed with VD on 12/29/23 patient still has burning, and has fish order Reason for Disposition  [1] Vaginal odor (bad smell) AND [2] not improved > 3 days following Care Advice  Answer Assessment - Initial Assessment Questions 1. SYMPTOM: What's the main symptom you're concerned about? (e.g., pain, itching, dryness)     Burning, fishy odor, persists 2. LOCATION: Where is the symptoms  located? (e.g., inside/outside, left/right)     Vaginal Area 3. ONSET: When did the  symptoms start?     Last night the symptoms worsened  4. PAIN: Is there any pain? If Yes, ask: How bad is it? (Scale: 1-10; mild, moderate, severe)   -  MILD (1-3): Doesn't interfere with normal activities.    -  MODERATE (4-7): Interferes with normal activities (e.g., work or school) or awakens from sleep.     -  SEVERE (8-10): Excruciating pain, unable to do any normal activities.      No pain    5. ITCHING: Is there any itching? If Yes, ask: How bad is it? (Scale: 1-10; mild, moderate, severe)     Yes, Mild  6.  CAUSE: What do you think is causing the discharge? Have you had the same problem before? What happened then?     Diagnosis of BV  7. OTHER SYMPTOMS: Do you have any other symptoms? (e.g., fever, itching, vaginal bleeding, pain with urination, injury to genital area, vaginal foreign body)     No 8. PREGNANCY: Is there any chance you are pregnant? When was your last menstrual period?    No    Just completed Flagyl  500mg . Symptoms persist.  Protocols used: Vaginal Symptoms-A-AH

## 2024-01-08 NOTE — Progress Notes (Signed)
    NURSE VISIT NOTE  Subjective:    Patient ID: Mariah Richardson, female    DOB: 17-Aug-1985, 38 y.o.   MRN: [id number]  HPI  Patient is a 38 y.o. [name] female who presents for continued BV symptoms following test and treatment with her PCP on 12/29/23 with metronidazole .  She completed the treatment but symptoms are worsening.  She says she was asymptomatic except for some cramping at her original visit.  Now she is having foul smelling discharge, vaginal burning and low abdominal cramping.  She also reports some discomfort while urinating.  She denies any significant pelvic pain or fever.  Her PCP is unable to see her until July so she requested a nurse visit here.     Objective:    BP 94/70   Pulse 76   Ht 5' 1 (1.549 m)   Wt 209 lb 1.6 oz (94.8 kg)   LMP 12/11/2023   BMI 39.51 kg/m      Assessment:   1. Bacterial vaginosis   2. Vaginal irritation   3. Dysuria       Plan:  Urinalysis WNL.  No culture needed.  GC, CT, BV and Yeast swab sent for analysis.   Consulted with Quince Bryant CNM who will prescribe Clindamycin suppositories to the [facility] on [address] in [place].    Samples of boric acid suppositories given to patient.  She has used them with success before, but her pharmacy is out of stock.  Advised if symptoms do not improve with Clindamycin she will need an appointment wit the midwife or doctor for further treatment.   ROV prn if symptoms persist or worsen.   Juanita Norlander, RN

## 2024-01-12 ENCOUNTER — Ambulatory Visit: Payer: Self-pay

## 2024-01-12 LAB — CERVICOVAGINAL ANCILLARY ONLY
Bacterial Vaginitis (gardnerella): NEGATIVE
Candida Glabrata: NEGATIVE
Candida Vaginitis: NEGATIVE
Chlamydia: NEGATIVE
Comment: NEGATIVE
Comment: NEGATIVE
Comment: NEGATIVE
Comment: NEGATIVE
Comment: NEGATIVE
Comment: NORMAL
Neisseria Gonorrhea: NEGATIVE
Trichomonas: NEGATIVE

## 2024-01-29 ENCOUNTER — Emergency Department
Admission: EM | Admit: 2024-01-29 | Discharge: 2024-01-29 | Discharge status: Against Medical Advice | Attending: Emergency Medicine | Admitting: Emergency Medicine

## 2024-01-29 ENCOUNTER — Other Ambulatory Visit: Payer: Self-pay

## 2024-01-29 DIAGNOSIS — S6991XA Unspecified injury of right wrist, hand and finger(s), initial encounter: Secondary | ICD-10-CM | POA: Diagnosis not present

## 2024-01-29 DIAGNOSIS — Z5321 Procedure and treatment not carried out due to patient leaving prior to being seen by health care provider: Secondary | ICD-10-CM | POA: Diagnosis not present

## 2024-01-29 DIAGNOSIS — W228XXA Striking against or struck by other objects, initial encounter: Secondary | ICD-10-CM | POA: Diagnosis not present

## 2024-01-29 NOTE — ED Notes (Signed)
 Pt not seen in ER at this time, pt no answer when called x3

## 2024-01-29 NOTE — ED Triage Notes (Signed)
 Patient states she hit her right thumb about 1.5 months ago on sunroof, had artificial nails removed and nail is partially detached from nailbed.

## 2024-02-11 ENCOUNTER — Encounter: Payer: Self-pay | Admitting: Physician Assistant

## 2024-02-12 ENCOUNTER — Encounter: Admitting: Physician Assistant

## 2024-02-16 ENCOUNTER — Other Ambulatory Visit: Payer: Self-pay | Admitting: Physician Assistant

## 2024-02-16 MED ORDER — SEMAGLUTIDE-WEIGHT MANAGEMENT 1 MG/0.5ML ~~LOC~~ SOAJ
1.0000 mg | SUBCUTANEOUS | 0 refills | Status: AC
Start: 2024-04-14 — End: 2024-05-12

## 2024-02-16 MED ORDER — SEMAGLUTIDE-WEIGHT MANAGEMENT 0.25 MG/0.5ML ~~LOC~~ SOAJ
0.2500 mg | SUBCUTANEOUS | 0 refills | Status: AC
Start: 2024-02-16 — End: 2024-03-15

## 2024-02-16 MED ORDER — SEMAGLUTIDE-WEIGHT MANAGEMENT 0.5 MG/0.5ML ~~LOC~~ SOAJ: 0.5000 mg | SUBCUTANEOUS | 0 refills | Status: AC

## 2024-02-19 ENCOUNTER — Telehealth: Admitting: Physician Assistant

## 2024-02-19 DIAGNOSIS — R112 Nausea with vomiting, unspecified: Secondary | ICD-10-CM | POA: Diagnosis not present

## 2024-02-19 MED ORDER — ONDANSETRON 4 MG PO TBDP: 4.0000 mg | ORAL_TABLET | Freq: Three times a day (TID) | ORAL | 0 refills | Status: DC | PRN

## 2024-02-19 NOTE — Progress Notes (Signed)

## 2024-03-12 ENCOUNTER — Ambulatory Visit (INDEPENDENT_AMBULATORY_CARE_PROVIDER_SITE_OTHER): Admitting: Physician Assistant

## 2024-03-12 ENCOUNTER — Encounter: Payer: Self-pay | Admitting: Physician Assistant

## 2024-03-12 VITALS — BP 124/73 | HR 98 | Ht 61.0 in | Wt 205.7 lb

## 2024-03-12 DIAGNOSIS — Z23 Encounter for immunization: Secondary | ICD-10-CM

## 2024-03-12 DIAGNOSIS — Z0001 Encounter for general adult medical examination with abnormal findings: Secondary | ICD-10-CM

## 2024-03-12 DIAGNOSIS — J3089 Other allergic rhinitis: Secondary | ICD-10-CM

## 2024-03-12 DIAGNOSIS — R7303 Prediabetes: Secondary | ICD-10-CM

## 2024-03-12 DIAGNOSIS — E782 Mixed hyperlipidemia: Secondary | ICD-10-CM

## 2024-03-12 DIAGNOSIS — Z0000 Encounter for general adult medical examination without abnormal findings: Secondary | ICD-10-CM

## 2024-03-12 NOTE — Progress Notes (Signed)
 Complete physical exam  Patient: Mariah Richardson   DOB: [birth year]   38 y.o. Female  MRN: [id number] Visit Date: 03/12/2024  Today's healthcare provider: Jolynn Spencer, PA-C   Chief Complaint  Patient presents with   Annual Exam    Last completed n/a Diet -  General, well balanced Exercise - walking 5 days a week for one hour Feeling - well Sleeping - poorly Concerns - none    Subjective    Mariah Richardson is a 39 y.o. female who presents today for a complete physical exam.   Discussed the use of AI scribe software for clinical note transcription with the patient, who gave verbal consent to proceed.  History of Present Illness Mariah Richardson is a 38 year old female who presents for an annual physical exam.  She experiences difficulty sleeping, describing her sleep as poor for several years. She is currently taking Wegovy , started in April, but has stopped metformin  due to adverse effects. She faces issues with prior authorization for Wegovy , resulting in being charged the full price recently.  She experiences seasonal allergies, which were particularly severe this summer and spring, deviating from her usual pattern of fall and winter allergies.  No issues with depression, anxiety, chest pain, shortness of breath, cold intolerance, or thyroid  problems. No problems with bowel movements, urination, or menstrual periods, although she experiences occasional cramps.  She has not had a mammogram yet and does not have a family history of breast cancer. She performs breast exams occasionally.    Last depression screening scores    09/23/2023    9:06 AM 08/11/2023    2:33 PM 07/31/2021    4:22 PM  PHQ 2/9 Scores  PHQ - 2 Score 0 0 0  PHQ- 9 Score 1 3 0   Last fall risk screening    08/11/2023    2:33 PM  Fall Risk   Falls in the past year? 0  Number falls in past yr: 0  Injury with Fall? 0  Risk for fall due to : No Fall Risks   Last Audit-C alcohol use  screening    03/10/2024   10:17 AM  Alcohol Use Disorder Test (AUDIT)  1. How often do you have a drink containing alcohol? 0  3. How often do you have six or more drinks on one occasion? 0   A score of 3 or more in women, and 4 or more in men indicates increased risk for alcohol abuse, EXCEPT if all of the points are from question 1   Past Medical History:  Diagnosis Date   Allergy    Gastritis    Hypertension    Past Surgical History:  Procedure Laterality Date   APPENDECTOMY     CHOLECYSTECTOMY     Social History   Socioeconomic History   Marital status: Single    Spouse name: Not on file   Number of children: Not on file   Years of education: Not on file   Highest education level: Some college, no degree  Occupational History   Not on file  Tobacco Use   Smoking status: Never   Smokeless tobacco: Never  Vaping Use   Vaping status: Never Used  Substance and Sexual Activity   Alcohol use: No   Drug use: No   Sexual activity: Yes    Birth control/protection: Abstinence, None  Other Topics Concern   Not on file  Social History Narrative   Not on file  Social Drivers of Corporate investment banker Strain: Low Risk  (03/10/2024)   Overall Financial Resource Strain (CARDIA)    Difficulty of Paying Living Expenses: Not hard at all  Food Insecurity: No Food Insecurity (03/10/2024)   Hunger Vital Sign    Worried About Running Out of Food in the Last Year: Never true    Ran Out of Food in the Last Year: Never true  Transportation Needs: No Transportation Needs (03/10/2024)   PRAPARE - Administrator, Civil Service (Medical): No    Lack of Transportation (Non-Medical): No  Physical Activity: Sufficiently Active (03/10/2024)   Exercise Vital Sign    Days of Exercise per Week: 3 days    Minutes of Exercise per Session: 60 min  Stress: Stress Concern Present (03/10/2024)   [employer] of Occupational Health - Occupational Stress Questionnaire     Feeling of Stress: Very much  Social Connections: Moderately Isolated (03/10/2024)   Social Connection and Isolation Panel    Frequency of Communication with Friends and Family: More than three times a week    Frequency of Social Gatherings with Friends and Family: Three times a week    Attends Religious Services: More than 4 times per year    Active Member of Clubs or Organizations: No    Attends Engineer, structural: Not on file    Marital Status: Divorced  Catering manager Violence: Not on file   Family Status  Relation Name Status   Mother Ren (Not Specified)   Father Joe (Not Specified)   MGM Vi (Not Specified)   PGM Jd (Not Specified)   PGF Jd (Not Specified)   Daughter Ra Alive   Son Cash Alive  No partnership data on file   Family History  Problem Relation Age of Onset   Diabetes Mother    Heart disease Mother    Arthritis Mother    Migraines Father    Diverticulitis Father    Cancer Father    Cancer Maternal Grandmother    Stroke Maternal Grandmother    Heart disease Maternal Grandmother    Cancer Paternal Grandmother    Heart disease Paternal Grandfather    Stroke Paternal Grandfather    Hearing loss Paternal Grandfather    Asthma Daughter    Asthma Son    Allergies  Allergen Reactions   Amoxicillin  Hives   Latex    Penicillins Hives    Patient Care Team: Merriam, Bena Kobel, PA-C as PCP - General (Physician Assistant)   Medications: Outpatient Medications Prior to Visit  Medication Sig   ondansetron  (ZOFRAN -ODT) 4 MG disintegrating tablet Take 1 tablet (4 mg total) by mouth every 8 (eight) hours as needed for nausea or vomiting.   Semaglutide -Weight Management 0.25 MG/0.5ML SOAJ Inject 0.25 mg into the skin once a week for 28 days.   [START ON 03/16/2024] Semaglutide -Weight Management 0.5 MG/0.5ML SOAJ Inject 0.5 mg into the skin once a week for 28 days.   [START ON 04/14/2024] Semaglutide -Weight Management 1 MG/0.5ML SOAJ Inject 1 mg into the skin  once a week for 28 days.   clindamycin  (CLEOCIN ) 100 MG vaginal suppository Place 1 suppository (100 mg total) vaginally at bedtime.   metFORMIN  (GLUCOPHAGE ) 500 MG tablet Take 1 tablet (500 mg total) by mouth daily with breakfast. (Patient not taking: Reported on 09/23/2023)   mupirocin  ointment (BACTROBAN ) 2 % Apply 1 Application topically 2 (two) times daily.   predniSONE  (STERAPRED UNI-PAK 21 TAB) 10 MG (21) TBPK tablet Take  as directed   triamcinolone  (KENALOG ) 0.025 % ointment Apply 1 Application topically 2 (two) times daily.   No facility-administered medications prior to visit.    Review of Systems Except see HPI     Objective    BP 124/73 (BP Location: Left Arm, Patient Position: Sitting, Cuff Size: Normal)   Pulse 98   Ht 5' 1 (1.549 m)   Wt 205 lb 11.2 oz (93.3 kg)   SpO2 100%   BMI 38.87 kg/m      Physical Exam Vitals reviewed.  Constitutional:      General: She is not in acute distress.    Appearance: Normal appearance. She is well-developed. She is obese. She is not ill-appearing, toxic-appearing or diaphoretic.  HENT:     Head: Normocephalic and atraumatic.     Right Ear: Tympanic membrane, ear canal and external ear normal.     Left Ear: Tympanic membrane, ear canal and external ear normal.     Nose: Nose normal. No congestion or rhinorrhea.     Mouth/Throat:     Mouth: Mucous membranes are moist.     Pharynx: Oropharynx is clear. No oropharyngeal exudate.  Eyes:     General: No scleral icterus.       Right eye: No discharge.        Left eye: No discharge.     Conjunctiva/sclera: Conjunctivae normal.     Pupils: Pupils are equal, round, and reactive to light.  Neck:     Thyroid : No thyromegaly.     Vascular: No carotid bruit.  Cardiovascular:     Rate and Rhythm: Normal rate and regular rhythm.     Pulses: Normal pulses.     Heart sounds: Normal heart sounds. No murmur heard.    No friction rub. No gallop.  Pulmonary:     Effort: Pulmonary  effort is normal. No respiratory distress.     Breath sounds: Normal breath sounds. No wheezing, rhonchi or rales.  Abdominal:     General: Abdomen is flat. Bowel sounds are normal. There is no distension.     Palpations: Abdomen is soft. There is no mass.     Tenderness: There is no abdominal tenderness. There is no right CVA tenderness, left CVA tenderness, guarding or rebound.     Hernia: No hernia is present.  Musculoskeletal:        General: No swelling, tenderness, deformity or signs of injury. Normal range of motion.     Cervical back: Normal range of motion and neck supple. No rigidity or tenderness.     Right lower leg: No edema.     Left lower leg: No edema.  Lymphadenopathy:     Cervical: No cervical adenopathy.  Skin:    General: Skin is warm and dry.     Coloration: Skin is not jaundiced or pale.     Findings: No bruising, erythema, lesion or rash.  Neurological:     Mental Status: She is alert and oriented to person, place, and time. Mental status is at baseline.     Gait: Gait normal.  Psychiatric:        Mood and Affect: Mood normal.        Behavior: Behavior normal.        Thought Content: Thought content normal.        Judgment: Judgment normal.      No results found for any visits on 03/12/24.  Assessment & Plan    Routine Health Maintenance and Physical Exam  Exercise Activities and Dietary recommendations  Goals   None     Immunization History  Administered Date(s) Administered   DTP 01/07/1989, 08/23/1991   Dtap, Unspecified 08/30/1987, 11/08/1987, 01/09/1988, 01/07/1989, 08/23/1991   Fluzone Influenza virus vaccine,trivalent (IIV3), split virus 04/25/2015   HIB, Unspecified 01/07/1989   HPV 9-valent 05/29/2023   Hepatitis B, ADULT 08/23/1991, 11/02/1991, 12/15/1992   Influenza, Seasonal, Injecte, Preservative Fre 08/25/2012, 05/29/2023   Influenza,inj,Quad PF,6+ Mos 04/02/2017, 05/11/2018, 04/28/2019   Influenza-Unspecified 04/08/2016,  04/03/2017, 05/11/2018, 04/28/2019, 08/23/2020, 09/19/2020, 10/09/2020, 12/11/2020, 07/30/2022   MMR 10/14/1988, 11/02/1991, 02/10/2012   MMRV 04/05/1999   Moderna SARS-COV2 Booster Vaccination 07/03/2020   Moderna Sars-Covid-2 Vaccination 12/30/2019, 01/27/2020   OPV 01/07/1989, 08/23/1991   Polio, Unspecified 08/30/1987, 11/08/1987, 01/09/1988, 01/07/1989, 08/23/1991   Td 04/05/1999   Tdap 03/03/2009, 02/10/2012, 11/17/2017, 03/27/2021    Health Maintenance  Topic Date Due   HPV Vaccine (2 - 3-dose SCDM series) 06/26/2023   Flu Shot  02/20/2024   Pap with HPV screening  11/19/2027   DTaP/Tdap/Td vaccine (11 - Td or Tdap) 03/28/2031   Hepatitis B Vaccine  Completed   Hepatitis C Screening  Completed   HIV Screening  Completed   Pneumococcal Vaccine  Aged Out   Meningitis B Vaccine  Aged Out   COVID-19 Vaccine  Discontinued    Discussed health benefits of physical activity, and encouraged her to engage in regular exercise appropriate for her age and condition. Assessment & Plan Adult Wellness Visit Routine wellness visit with normal findings and no significant family history of breast cancer. No recent mammogram. - Schedule annual physical exam in one year. - Order blood work including basic metabolic panel and prediabetes screening. - Provided education on breast self-exam techniques. - Schedule mammogram as per guidelines.  Obesity Chronic and improving Currently on Wegovy  for weight management. Metformin  discontinued due to adverse effects. - Check prior authorization status for Wegovy  0.25-0.5mg  weekly. - Per chart review, pt has PA approved until October of 2025. - Encourage follow-up in six months for chronic condition management.  Seasonal allergic rhinitis Experiencing symptoms more severely in summer and spring this year. - Avoidance measures discussed. - Use nasal saline rinses before nose sprays such as with Neilmed Sinus Rinse bottle.  Use distilled water.   -  Use Flonase  2 sprays each nostril daily. Aim upward and outward. - Use Zyrtec 10 mg daily. .   Insomnia Chronic insomnia persisting for years. Will address at the follow-up  Morbid obesity (HCC)  - Comprehensive metabolic panel with GFR - Lipid Panel With LDL/HDL Ratio - CBC with Differential/Platelet - Hemoglobin A1c  Annual physical exam (Primary) Well adult visit with abnormal findings Things to do to keep yourself healthy  - Exercise at least 30-45 minutes a day, 3-4 days a week.  - Eat a low-fat diet with lots of fruits and vegetables, up to 7-9 servings per day.  - Seatbelts can save your life. Wear them always.  - Smoke detectors on every level of your home, check batteries every year.  - Eye Doctor - have an eye exam every 1-2 years  - Safe sex - if you may be exposed to STDs, use a condom.  - Alcohol -  If you drink, do it moderately, less than 2 drinks per day.  - Health Care Power of Attorney. Choose someone to speak for you if you are not able.  - Depression is common in our stressful world.If you're feeling down or losing interest in things you  normally enjoy, please come in for a visit.  - Violence - If anyone is threatening or hurting you, please call immediately.   Mixed hyperlipidemia Chronic and stable Not on medication but last LDL was 139 Continue lifestyle modifications - Comprehensive metabolic panel with GFR - Lipid Panel With LDL/HDL Ratio - CBC with Differential/Platelet - Hemoglobin A1c Will follow-up  Prediabetes Chronic , last A1c was 5.5 Continue with lifestyle modifications - Comprehensive metabolic panel with GFR - Lipid Panel With LDL/HDL Ratio - CBC with Differential/Platelet - Hemoglobin A1c Will follow-up  No follow-ups on file.    The patient was advised to call back or seek an in-person evaluation if the symptoms worsen or if the condition fails to improve as anticipated.  I discussed the assessment and treatment plan with the  patient. The patient was provided an opportunity to ask questions and all were answered. The patient agreed with the plan and demonstrated an understanding of the instructions.  I, Genita Nilsson, PA-C have reviewed all documentation for this visit. The documentation on 03/12/2024  for the exam, diagnosis, procedures, and orders are all accurate and complete.  Jolynn Spencer, [facility], MMS [facility] [phone] (phone) [phone] (fax)  [facility]

## 2024-03-13 LAB — CBC WITH DIFFERENTIAL/PLATELET
Basophils Absolute: 0 x10E3/uL (ref 0.0–0.2)
Basos: 1 %
EOS (ABSOLUTE): 0.1 x10E3/uL (ref 0.0–0.4)
Eos: 2 %
Hematocrit: 44 % (ref 34.0–46.6)
Hemoglobin: 13.6 g/dL (ref 11.1–15.9)
Immature Grans (Abs): 0 x10E3/uL (ref 0.0–0.1)
Immature Granulocytes: 0 %
Lymphocytes Absolute: 1.4 x10E3/uL (ref 0.7–3.1)
Lymphs: 18 %
MCH: 28.5 pg (ref 26.6–33.0)
MCHC: 30.9 g/dL — ABNORMAL LOW (ref 31.5–35.7)
MCV: 92 fL (ref 79–97)
Monocytes Absolute: 0.6 x10E3/uL (ref 0.1–0.9)
Monocytes: 8 %
Neutrophils Absolute: 5.4 x10E3/uL (ref 1.4–7.0)
Neutrophils: 71 %
Platelets: 340 x10E3/uL (ref 150–450)
RBC: 4.78 x10E6/uL (ref 3.77–5.28)
RDW: 13.4 % (ref 11.7–15.4)
WBC: 7.5 x10E3/uL (ref 3.4–10.8)

## 2024-03-13 LAB — COMPREHENSIVE METABOLIC PANEL WITH GFR
ALT: 11 IU/L (ref 0–32)
AST: 14 IU/L (ref 0–40)
Albumin: 4.1 g/dL (ref 3.9–4.9)
Alkaline Phosphatase: 61 IU/L (ref 44–121)
BUN/Creatinine Ratio: 12 (ref 9–23)
BUN: 8 mg/dL (ref 6–20)
Bilirubin Total: 0.3 mg/dL (ref 0.0–1.2)
CO2: 23 mmol/L (ref 20–29)
Calcium: 9.2 mg/dL (ref 8.7–10.2)
Chloride: 103 mmol/L (ref 96–106)
Creatinine, Ser: 0.65 mg/dL (ref 0.57–1.00)
Globulin, Total: 2.8 g/dL (ref 1.5–4.5)
Glucose: 88 mg/dL (ref 70–99)
Potassium: 4.4 mmol/L (ref 3.5–5.2)
Sodium: 139 mmol/L (ref 134–144)
Total Protein: 6.9 g/dL (ref 6.0–8.5)
eGFR: 117 mL/min/1.73 (ref >59)

## 2024-03-13 LAB — HEMOGLOBIN A1C
Est. average glucose Bld gHb Est-mCnc: 103 mg/dL
Hgb A1c MFr Bld: 5.2 % (ref 4.8–5.6)

## 2024-03-13 LAB — LIPID PANEL WITH LDL/HDL RATIO
Cholesterol, Total: 226 mg/dL — ABNORMAL HIGH (ref 100–199)
HDL: 49 mg/dL (ref >39)
LDL Chol Calc (NIH): 155 mg/dL — ABNORMAL HIGH (ref 0–99)
LDL/HDL Ratio: 3.2 ratio (ref 0.0–3.2)
Triglycerides: 120 mg/dL (ref 0–149)
VLDL Cholesterol Cal: 22 mg/dL (ref 5–40)

## 2024-03-14 DIAGNOSIS — Z0000 Encounter for general adult medical examination without abnormal findings: Secondary | ICD-10-CM | POA: Insufficient documentation

## 2024-03-14 DIAGNOSIS — J309 Allergic rhinitis, unspecified: Secondary | ICD-10-CM | POA: Insufficient documentation

## 2024-03-15 ENCOUNTER — Ambulatory Visit: Payer: Self-pay | Admitting: Physician Assistant

## 2024-03-25 ENCOUNTER — Encounter: Admitting: Physician Assistant

## 2024-04-01 ENCOUNTER — Ambulatory Visit: Admitting: Physician Assistant

## 2024-04-05 ENCOUNTER — Encounter: Payer: Self-pay | Admitting: Physician Assistant

## 2024-04-07 ENCOUNTER — Other Ambulatory Visit (HOSPITAL_COMMUNITY): Payer: Self-pay

## 2024-04-07 NOTE — Telephone Encounter (Signed)
 Per test claim, previous PA is still active until October, called pharmacy, phone rang for 6 minutes, will try to call pt's pharmacy at a later time to ask them to rerun medication. Thanks

## 2024-04-07 NOTE — Telephone Encounter (Signed)
 Attempted to call pharmacy again, phone picked up for a second and then hung up

## 2024-04-12 ENCOUNTER — Encounter: Payer: Self-pay | Admitting: Physician Assistant

## 2024-04-12 ENCOUNTER — Other Ambulatory Visit (HOSPITAL_COMMUNITY)
Admission: RE | Admit: 2024-04-12 | Discharge: 2024-04-12 | Disposition: A | Discharge status: Home / Self Care | Source: Ambulatory Visit | Attending: Physician Assistant | Admitting: Physician Assistant

## 2024-04-12 ENCOUNTER — Ambulatory Visit: Admitting: Physician Assistant

## 2024-04-12 VITALS — BP 120/62 | HR 96 | Resp 16 | Ht 61.0 in | Wt 202.1 lb

## 2024-04-12 DIAGNOSIS — Z113 Encounter for screening for infections with a predominantly sexual mode of transmission: Secondary | ICD-10-CM | POA: Insufficient documentation

## 2024-04-12 DIAGNOSIS — Z23 Encounter for immunization: Secondary | ICD-10-CM

## 2024-04-12 LAB — POCT URINALYSIS DIPSTICK
Bilirubin, UA: NEGATIVE
Blood, UA: NEGATIVE
Glucose, UA: NEGATIVE
Ketones, UA: NEGATIVE
Leukocytes, UA: NEGATIVE
Nitrite, UA: NEGATIVE
Protein, UA: NEGATIVE
Spec Grav, UA: 1.015 (ref 1.010–1.025)
Urobilinogen, UA: 0.2 U/dL
pH, UA: 6 (ref 5.0–8.0)

## 2024-04-12 NOTE — Progress Notes (Signed)
 Established patient visit  Patient: Mariah Richardson   DOB: 26-Nov-1985   38 y.o. Female  MRN: [id number] Visit Date: 04/12/2024  Today's healthcare provider: Jolynn Spencer, PA-C   Chief Complaint  Patient presents with   Vaginal Discharge    White vaginal discharge x 3 weeks would like to do the full STD panel   Subjective     HPI     Vaginal Discharge    Additional comments: White vaginal discharge x 3 weeks would like to do the full STD panel      Last edited by Wilfred Hargis RAMAN, CMA on 04/12/2024  8:05 AM.       Discussed the use of AI scribe software for clinical note transcription with the patient, who gave verbal consent to proceed.  History of Present Illness Mariah Richardson is a 38 year old female who presents for STI screening and evaluation of a burning sensation.  She experiences a burning sensation throughout the day, not associated with urination or intercourse, and a white discharge. She recently started Wegovy , which she associates with the return of symptoms, along with nausea and constipation. She has a history of bacterial vaginosis.  She seeks STI screening after resuming a relationship with a previous partner, requesting tests for HIV, syphilis, and HPV. She agrees to provide a urine sample and vaginal swab for comprehensive testing. 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  She is interested in receiving the HPV and flu vaccines during this visit. She works from home but is concerned about exposure to germs from her children who bring them home from school.       09/23/2023    9:06 AM 08/11/2023    2:33 PM 07/31/2021    4:22 PM  Depression screen PHQ 2/9  Decreased Interest 0 0 0  Down, Depressed, Hopeless 0 0 0  PHQ - 2 Score 0 0 0  Altered sleeping 0 3 0  Tired, decreased energy 0 0 0  Change in appetite 1 0 0  Feeling bad or failure about yourself  0 0 0  Trouble concentrating 0 0 0  Moving slowly or  fidgety/restless 0 0 0  Suicidal thoughts 0 0 0  PHQ-9 Score 1 3 0  Difficult doing work/chores  Not difficult at all       09/23/2023    9:06 AM 08/11/2023    2:34 PM 08/11/2023    2:33 PM 07/31/2021    4:22 PM  GAD 7 : Generalized Anxiety Score  Nervous, Anxious, on Edge 0 0 0 0  Control/stop worrying 3 3 3  0  Worry too much - different things 3 3 3  0  Trouble relaxing 3 0 0 0  Restless 0 0 0 0  Easily annoyed or irritable 3 3 3  0  Afraid - awful might happen 0 0 0 0  Total GAD 7 Score 12 9 9  0  Anxiety Difficulty Not difficult at all Not difficult at all Not difficult at all Not difficult at all    Medications: Outpatient Medications Prior to Visit  Medication Sig   ondansetron  (ZOFRAN -ODT) 4 MG disintegrating tablet Take 1 tablet (4 mg total) by mouth every 8 (eight) hours as needed for nausea or vomiting.   Semaglutide -Weight Management 0.5 MG/0.5ML SOAJ Inject 0.5 mg into the skin once a week for 28 days.   [START ON 04/14/2024] Semaglutide -Weight Management 1 MG/0.5ML SOAJ Inject 1 mg into the skin once a week for 28 days.  No facility-administered medications prior to visit.    Review of Systems All negative Except see HPI       Objective    BP 120/62   Pulse 96   Resp 16   Ht 5' 1 (1.549 m)   Wt 202 lb 1.6 oz (91.7 kg)   LMP 03/25/2024   SpO2 100%   BMI 38.19 kg/m     Physical Exam Constitutional:      General: She is not in acute distress.    Appearance: Normal appearance.  HENT:     Head: Normocephalic.  Pulmonary:     Effort: Pulmonary effort is normal. No respiratory distress.  Abdominal:     Tenderness: There is no abdominal tenderness. There is no right CVA tenderness, left CVA tenderness, guarding or rebound.  Neurological:     Mental Status: She is alert and oriented to person, place, and time. Mental status is at baseline.      No results found for any visits on 04/12/24.      Assessment & Plan Burning vaginal sensation and white  discharge Burning sensation and discharge suggest bacterial vaginosis or UTI. Wegovy  may contribute to symptoms. - Perform vaginal swab for comprehensive panel to rule out infections including bacterial vaginosis and UTI. - Conduct HIV and syphilis testing. - Advise condom use to prevent infections.  Constipation Constipation likely related to Wegovy  use.  General Health Maintenance Discussed HPV vaccination for cervical cancer prevention and flu vaccination due to exposure risk. - Administer HPV vaccine. - Administer flu vaccine.  Need for HPV vaccine  - HPV 9-valent vaccine,Recombinat  Screening for STD (sexually transmitted disease) (Primary)  - POCT Urinalysis Dipstick - Cervicovaginal ancillary only - HIV antibody (with reflex) - RPR w/reflex to [employer] due  - Flu vaccine trivalent PF, 6mos and older(Flulaval,Afluria,Fluarix,Fluzone)   No orders of the defined types were placed in this encounter.   No follow-ups on file.   The patient was advised to call back or seek an in-person evaluation if the symptoms worsen or if the condition fails to improve as anticipated.  I discussed the assessment and treatment plan with the patient. The patient was provided an opportunity to ask questions and all were answered. The patient agreed with the plan and demonstrated an understanding of the instructions.  I, Jaria Conway, PA-C have reviewed all documentation for this visit. The documentation on 04/12/2024  for the exam, diagnosis, procedures, and orders are all accurate and complete.  Jolynn Spencer, [facility], MMS [facility] [phone] (phone) [phone] (fax)  [facility]

## 2024-04-13 LAB — CERVICOVAGINAL ANCILLARY ONLY
Bacterial Vaginitis (gardnerella): NEGATIVE
Candida Glabrata: NEGATIVE
Candida Vaginitis: NEGATIVE
Chlamydia: NEGATIVE
Comment: NEGATIVE
Comment: NEGATIVE
Comment: NEGATIVE
Comment: NEGATIVE
Comment: NEGATIVE
Comment: NORMAL
Neisseria Gonorrhea: NEGATIVE
Trichomonas: NEGATIVE

## 2024-04-13 LAB — HIV ANTIBODY (ROUTINE TESTING W REFLEX): HIV Screen 4th Generation wRfx: NONREACTIVE

## 2024-04-13 LAB — RPR W/REFLEX TO TREPSURE: RPR: REACTIVE — AB

## 2024-04-13 LAB — RPR, QUANT: RPR, Quant: 1:1 {titer} — ABNORMAL HIGH

## 2024-04-14 ENCOUNTER — Ambulatory Visit: Payer: Self-pay | Admitting: Physician Assistant

## 2024-04-19 ENCOUNTER — Telehealth: Payer: Self-pay

## 2024-04-19 NOTE — Telephone Encounter (Signed)
 Joane Shine with the [facility] Department called regarding patient having positive RPR.  Wants to know if you did Syphilis antibody and if not will you add to previous lab.  Today is probably the last day the lab will hold the blood.  Once resulted please call Joane Shine back at [phone]

## 2024-04-21 NOTE — Telephone Encounter (Signed)
 Patient is calling in with questions regarding the message she was sent from her provider regarding going to the health department for further testing. Please follow up with patient.

## 2024-04-27 ENCOUNTER — Encounter: Payer: Self-pay | Admitting: Physician Assistant

## 2024-04-27 DIAGNOSIS — R112 Nausea with vomiting, unspecified: Secondary | ICD-10-CM

## 2024-04-27 MED ORDER — ONDANSETRON 4 MG PO TBDP: 4.0000 mg | ORAL_TABLET | Freq: Three times a day (TID) | ORAL | 0 refills | Status: DC | PRN

## 2024-05-05 ENCOUNTER — Other Ambulatory Visit: Payer: Self-pay | Admitting: Medical Genetics

## 2024-05-05 DIAGNOSIS — Z006 Encounter for examination for normal comparison and control in clinical research program: Secondary | ICD-10-CM

## 2024-05-09 ENCOUNTER — Ambulatory Visit
Admission: RE | Admit: 2024-05-09 | Discharge: 2024-05-09 | Disposition: A | Discharge status: Home / Self Care | Source: Ambulatory Visit | Attending: Student | Admitting: Student

## 2024-05-09 ENCOUNTER — Ambulatory Visit: Payer: Self-pay | Admitting: Physician Assistant

## 2024-05-09 VITALS — BP 121/81 | HR 94 | Temp 98.6°F | Ht 61.0 in | Wt 196.0 lb

## 2024-05-09 DIAGNOSIS — N898 Other specified noninflammatory disorders of vagina: Secondary | ICD-10-CM | POA: Insufficient documentation

## 2024-05-09 DIAGNOSIS — Z113 Encounter for screening for infections with a predominantly sexual mode of transmission: Secondary | ICD-10-CM | POA: Insufficient documentation

## 2024-05-09 LAB — HIV ANTIBODY (ROUTINE TESTING W REFLEX): HIV Screen 4th Generation wRfx: NONREACTIVE

## 2024-05-09 NOTE — ED Triage Notes (Signed)
 Patient reports vaginal discharge that started earlier this week.  Patient reports discharge is white and creamy.  Patient wants STD check due to having a new partner.

## 2024-05-09 NOTE — ED Provider Notes (Signed)
 [facility]    CSN: [id number] Arrival date & time: 05/09/24  1008      History   Chief Complaint Chief Complaint  Patient presents with   Exposure to STD    Appointment    HPI Mariah Richardson is a 38 y.o. female.   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  No fever or associated UTI symptoms.  She has not been on any antibiotics recently.  She does have a new sexual partner and she is requesting STI testing to include HIV and RPR.  Past Medical History:  Diagnosis Date   Allergy    Gastritis    Hypertension     Patient Active Problem List   Diagnosis Date Noted   Morbid obesity (HCC) 03/14/2024   Annual physical exam 03/14/2024   Allergic rhinitis due to allergen 03/14/2024   Mixed hyperlipidemia 06/28/2023   Prediabetes 06/28/2023   Obesity, class 3 (HCC) 03/08/2022   Sciatica of right side 01/29/2022   Supervision of high risk pregnancy, antepartum 11/06/2020   Chronic migraine 07/30/2019   History of marijuana use 06/30/2017   Biological false positive RPR test 02/28/2017   Anxiety 10/03/2015    Past Surgical History:  Procedure Laterality Date   APPENDECTOMY     CHOLECYSTECTOMY      OB History     Gravida  5   Para  4   Term  4   Preterm      AB  1   Living  4      SAB      IAB      Ectopic      Multiple  0   Live Births  4            Home Medications    Prior to Admission medications   Medication Sig Start Date End Date Taking? Authorizing Provider  ondansetron  (ZOFRAN -ODT) 4 MG disintegrating tablet Take 1 tablet (4 mg total) by mouth every 8 (eight) hours as needed for nausea or vomiting. 04/27/24   Ostwalt, Janna, PA-C   Semaglutide -Weight Management 1 MG/0.5ML SOAJ Inject 1 mg into the skin once a week for 28 days. 04/14/24 05/12/24  Ostwalt, Janna, PA-C    Family History Family History  Problem Relation Age of Onset   Diabetes Mother    Heart disease Mother    Arthritis Mother    Migraines Father    Diverticulitis Father    Cancer Father    Cancer Maternal Grandmother    Stroke Maternal Grandmother    Heart disease Maternal Grandmother    Cancer Paternal Grandmother    Heart disease Paternal Grandfather    Stroke Paternal Grandfather    Hearing loss Paternal Grandfather    Asthma Daughter    Asthma Son     Social History Social History   Tobacco Use   Smoking status: Never   Smokeless tobacco: Never  Vaping Use   Vaping status: Never Used  Substance Use Topics   Alcohol use: No   Drug use: No     Allergies   Amoxicillin , Latex, and Penicillins   Review of Systems Review of Systems  Constitutional:  Negative for fever.  Gastrointestinal:  Positive for abdominal pain. Negative for nausea and vomiting.  Suprapubic pain  Genitourinary:  Positive for vaginal discharge. Negative for dysuria, frequency, hematuria and urgency.  Musculoskeletal:  Positive for back pain.     Physical Exam Triage Vital Signs ED Triage Vitals  Encounter Vitals Group     BP --      Girls Systolic BP Percentile --      Girls Diastolic BP Percentile --      Boys Systolic BP Percentile --      Boys Diastolic BP Percentile --      Pulse --      Resp --      Temp --      Temp src --      SpO2 --      Weight 05/09/24 1016 196 lb (88.9 kg)     Height 05/09/24 1016 5' 1 (1.549 m)     Head Circumference --      Peak Flow --      Pain Score 05/09/24 1015 0     Pain Loc --      Pain Education --      Exclude from Growth Chart --    No data found.  Updated Vital Signs BP 121/81 (BP Location: Right Arm)   Pulse 94   Temp 98.6 F (37 C) (Oral)   Ht 5' 1 (1.549 m)   Wt 196 lb (88.9 kg)    LMP 04/19/2024 (Exact Date)   SpO2 98%   BMI 37.03 kg/m   Visual Acuity Right Eye Distance:   Left Eye Distance:   Bilateral Distance:    Right Eye Near:   Left Eye Near:    Bilateral Near:     Physical Exam Vitals and nursing note reviewed.  Constitutional:      Appearance: Normal appearance. She is not ill-appearing.  HENT:     Head: Normocephalic and atraumatic.  Cardiovascular:     Rate and Rhythm: Normal rate and regular rhythm.     Pulses: Normal pulses.     Heart sounds: Normal heart sounds. No murmur heard.    No friction rub. No gallop.  Pulmonary:     Effort: Pulmonary effort is normal.     Breath sounds: Normal breath sounds.  Abdominal:     Palpations: Abdomen is soft.     Tenderness: There is abdominal tenderness. There is no right CVA tenderness, left CVA tenderness, guarding or rebound.     Comments: Mild suprapubic tenderness without guarding or rebound.  Musculoskeletal:        General: Tenderness present.     Comments: Mild tenderness with palpation of the lower lumbar paraspinous regions bilaterally.  No overt spasm noted.  Neurological:     Mental Status: She is alert.      UC Treatments / Results  Labs (all labs ordered are listed, but only abnormal results are displayed) Labs Reviewed  CERVICOVAGINAL ANCILLARY ONLY    EKG   Radiology No results found.  Procedures Procedures (including critical care time)  Medications Ordered in UC Medications - No data to display  Initial Impression / Assessment and Plan / UC Course  I have reviewed the triage vital signs and the nursing notes.  Pertinent labs & imaging results that were available during my care of the patient were reviewed by me and considered in my medical decision making (see chart for details).   Patient is a pleasant, nontoxic-appearing 38 year old presenting for evaluation of vaginal discharge outlined HPI above.  She does have a history of recurrent  BV and yeast  infections.  She is currently on Wegovy  for weight management and has a history of prediabetes as well.  She has not been on any antibiotics recently that could have potentiated a yeast infection.  She describes the discharge as creamy white but denies any odor.  She has a new sexual partner so she would like to be tested for STIs to include HIV and RPR.  She does have a history of a biological false positive RPR.  I will order the blood work as well as the vaginal cytology swab.  I advised the patient that there are other conditions that could also potentially cause the vaginal discharge and that she should follow-up with her OB/GYN if her cytology results were negative.  Patient verbalized understanding of same.  Vaginal cytology swab is negative for gonorrhea, chlamydia, trichomonas and candida.  Positive for bacterial vaginosis.  RPR reading reactive but the T palladium antibody total is nonreactive.  HIV is nonreactive.     Final Clinical Impressions(s) / UC Diagnoses   Final diagnoses:  Vaginal discharge  Screen for STD (sexually transmitted disease)     Discharge Instructions      As we discussed, your testing results should be back in the next 1 to 2 days.  If you test positive for any infection you will be contacted by phone and treatment options will be provided.  If your results are negative they will appear in your MyChart.  If the results of your vaginal swab are negative I would recommend making a follow-up appointment with your GYN for further evaluation as there are other inflammatory conditions of the vaginal vault that could be potentially causing your discharge.     ED Prescriptions   None    PDMP not reviewed this encounter.   Bernardino Ditch, NP 05/09/24 1030    Bernardino Ditch, NP 05/12/24 [phone]

## 2024-05-09 NOTE — Discharge Instructions (Addendum)
 As we discussed, your testing results should be back in the next 1 to 2 days.  If you test positive for any infection you will be contacted by phone and treatment options will be provided.  If your results are negative they will appear in your MyChart.  If the results of your vaginal swab are negative I would recommend making a follow-up appointment with your GYN for further evaluation as there are other inflammatory conditions of the vaginal vault that could be potentially causing your discharge.

## 2024-05-10 LAB — CERVICOVAGINAL ANCILLARY ONLY
Bacterial Vaginitis (gardnerella): POSITIVE — AB
Candida Glabrata: NEGATIVE
Candida Vaginitis: NEGATIVE
Chlamydia: NEGATIVE
Comment: NEGATIVE
Comment: NEGATIVE
Comment: NEGATIVE
Comment: NEGATIVE
Comment: NEGATIVE
Comment: NORMAL
Neisseria Gonorrhea: NEGATIVE
Trichomonas: NEGATIVE

## 2024-05-10 LAB — RPR
RPR Ser Ql: REACTIVE — AB
RPR Titer: 1:2 {titer}

## 2024-05-11 ENCOUNTER — Telehealth: Admitting: Physician Assistant

## 2024-05-11 DIAGNOSIS — B9689 Other specified bacterial agents as the cause of diseases classified elsewhere: Secondary | ICD-10-CM

## 2024-05-11 DIAGNOSIS — N760 Acute vaginitis: Secondary | ICD-10-CM | POA: Diagnosis not present

## 2024-05-11 LAB — T.PALLIDUM AB, TOTAL: T Pallidum Abs: NONREACTIVE

## 2024-05-11 MED ORDER — METRONIDAZOLE 500 MG PO TABS: 500.0000 mg | ORAL_TABLET | Freq: Two times a day (BID) | ORAL | 0 refills | Status: DC

## 2024-05-11 NOTE — Progress Notes (Signed)

## 2024-05-12 ENCOUNTER — Telehealth: Payer: Self-pay | Admitting: Emergency Medicine

## 2024-05-12 NOTE — Telephone Encounter (Signed)
 Patient cytology swab had shown positive for BV.  Went to prescribe the metronidazole  and found it had already been prescribed.

## 2024-05-13 ENCOUNTER — Ambulatory Visit: Admitting: Physician Assistant

## 2024-05-14 ENCOUNTER — Ambulatory Visit (INDEPENDENT_AMBULATORY_CARE_PROVIDER_SITE_OTHER): Admitting: Physician Assistant

## 2024-05-14 ENCOUNTER — Encounter: Payer: Self-pay | Admitting: Physician Assistant

## 2024-05-14 VITALS — BP 116/63 | HR 94 | Resp 16 | Ht 61.0 in | Wt 198.7 lb

## 2024-05-14 DIAGNOSIS — Z23 Encounter for immunization: Secondary | ICD-10-CM | POA: Diagnosis not present

## 2024-05-14 DIAGNOSIS — R0683 Snoring: Secondary | ICD-10-CM

## 2024-05-14 DIAGNOSIS — E782 Mixed hyperlipidemia: Secondary | ICD-10-CM

## 2024-05-14 DIAGNOSIS — E66813 Obesity, class 3: Secondary | ICD-10-CM

## 2024-05-14 DIAGNOSIS — R7303 Prediabetes: Secondary | ICD-10-CM | POA: Diagnosis not present

## 2024-05-14 DIAGNOSIS — R7689 Other specified abnormal immunological findings in serum: Secondary | ICD-10-CM | POA: Diagnosis not present

## 2024-05-14 MED ORDER — WEGOVY 1 MG/0.5ML ~~LOC~~ SOAJ: 1.0000 mg | SUBCUTANEOUS | 0 refills | Status: DC

## 2024-05-17 DIAGNOSIS — R0683 Snoring: Secondary | ICD-10-CM | POA: Insufficient documentation

## 2024-05-17 DIAGNOSIS — Z8619 Personal history of other infectious and parasitic diseases: Secondary | ICD-10-CM | POA: Insufficient documentation

## 2024-05-17 NOTE — Progress Notes (Signed)
 Established patient visit  Patient: Mariah Richardson   DOB: 03-19-1986   38 y.o. Female  MRN: [id number] Visit Date: 05/14/2024  Today's healthcare provider: Jolynn Spencer, PA-C   Chief Complaint  Patient presents with   Medical Management of Chronic Issues   Medication Refill    Wegovy  1mg  last refill in dispense 04/05/24   Subjective     HPI     Medication Refill    Additional comments: Wegovy  1mg  last refill in dispense 04/05/24      Last edited by Wilfred Hargis RAMAN, CMA on 05/14/2024  9:06 AM.       Discussed the use of AI scribe software for clinical note transcription with the patient, who gave verbal consent to proceed.  History of Present Illness Mariah Richardson is a 38 year old female who presents for a follow-up regarding her weight loss medication, Wegovy .  She has concerns about potential discontinuation of Medicaid coverage for weight loss medications. She completed her last dose of Wegovy  on Monday and seeks a prescription renewal. Her current medications include metronidazole  and Zofran .  She restarted wegovy  , her dosage due to missed doses, beginning at 0.25 mg and progressing to 1 mg.  She experiences difficulty falling asleep and is unsure if she has sleep apnea, though she has been told she snores. There is no gasping for air during sleep.  She is uncertain about her current cholesterol levels but has been eating smaller portions. She recalls a previous issue with her cholesterol levels.       09/23/2023    9:06 AM 08/11/2023    2:33 PM 07/31/2021    4:22 PM  Depression screen PHQ 2/9  Decreased Interest 0 0 0  Down, Depressed, Hopeless 0 0 0  PHQ - 2 Score 0 0 0  Altered sleeping 0 3 0  Tired, decreased energy 0 0 0  Change in appetite 1 0 0  Feeling bad or failure about yourself  0 0 0  Trouble concentrating 0 0 0  Moving slowly or fidgety/restless 0 0 0  Suicidal thoughts 0 0 0  PHQ-9 Score 1 3 0  Difficult doing work/chores  Not difficult  at all       09/23/2023    9:06 AM 08/11/2023    2:34 PM 08/11/2023    2:33 PM 07/31/2021    4:22 PM  GAD 7 : Generalized Anxiety Score  Nervous, Anxious, on Edge 0 0 0 0  Control/stop worrying 3 3 3  0  Worry too much - different things 3 3 3  0  Trouble relaxing 3 0 0 0  Restless 0 0 0 0  Easily annoyed or irritable 3 3 3  0  Afraid - awful might happen 0 0 0 0  Total GAD 7 Score 12 9 9  0  Anxiety Difficulty Not difficult at all Not difficult at all Not difficult at all Not difficult at all    Medications: Outpatient Medications Prior to Visit  Medication Sig   metroNIDAZOLE  (FLAGYL ) 500 MG tablet Take 1 tablet (500 mg total) by mouth 2 (two) times daily.   ondansetron  (ZOFRAN -ODT) 4 MG disintegrating tablet Take 1 tablet (4 mg total) by mouth every 8 (eight) hours as needed for nausea or vomiting.   No facility-administered medications prior to visit.    Review of Systems All negative Except see HPI       Objective    BP 116/63   Pulse 94   Resp 16  Ht 5' 1 (1.549 m)   Wt 198 lb 11.2 oz (90.1 kg)   LMP 04/19/2024 (Exact Date)   SpO2 98%   BMI 37.54 kg/m     Physical Exam Vitals reviewed.  Constitutional:      General: She is not in acute distress.    Appearance: Normal appearance. She is well-developed. She is not diaphoretic.  HENT:     Head: Normocephalic and atraumatic.  Eyes:     General: No scleral icterus.    Conjunctiva/sclera: Conjunctivae normal.  Neck:     Thyroid : No thyromegaly.  Cardiovascular:     Rate and Rhythm: Normal rate and regular rhythm.     Pulses: Normal pulses.     Heart sounds: Normal heart sounds. No murmur heard. Pulmonary:     Effort: Pulmonary effort is normal. No respiratory distress.     Breath sounds: Normal breath sounds. No wheezing, rhonchi or rales.  Musculoskeletal:     Cervical back: Neck supple.     Right lower leg: No edema.     Left lower leg: No edema.  Lymphadenopathy:     Cervical: No cervical  adenopathy.  Skin:    General: Skin is warm and dry.     Findings: No rash.  Neurological:     Mental Status: She is alert and oriented to person, place, and time. Mental status is at baseline.  Psychiatric:        Mood and Affect: Mood normal.        Behavior: Behavior normal.      No results found for any visits on 05/14/24.      Assessment & Plan Obesity Chronic, associated with hypertension, hyperlipidemia, prediabetes  on Wegovy  for weight management. Awaiting Medicaid approval for continued prescription. - Prescribe Wegovy  for three months. Monitor Medicaid approval. Continue healthy diet and regular exercise Will follow-up  Hyperlipidemia Chronic  cholesterol levels from 03/12/2024 were elevated Total cholesterol 226 and LDL cholesterol 155 Continue low-cholesterol diet and regular exercise We will follow-up  Snoring and Insomnia Difficulty falling asleep and snoring. Needs further evaluation needed. - Refer to sleep specialist for sleep apnea evaluation. 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 Persistent low RPR titers without active infection.  T. pallidum antibodies from 05/09/24 were negative.  Is around date from 3 years ago and 6 years ago with a positive rapid plasma reagin test and negative recommend the test - Discussed persistent low RPR titers with health department.  Obesity, class 3 (HCC)  - semaglutide -weight management (WEGOVY ) 1 MG/0.5ML SOAJ SQ injection; Inject 1 mg into the skin once a week.  Dispense: 6 mL; Refill: 0   Prediabetes Improved A1c from 2 months ago was 5.2 Continue low-carb diet and regular exercise Will follow-up  Orders Placed This Encounter  Procedures   HPV 9-valent vaccine,Recombinat    Return in about 3 months (around 08/14/2024).   The patient was advised to call back or seek an in-person evaluation if the symptoms worsen  or if the condition fails to improve as anticipated.  I discussed the assessment and treatment plan with the patient. The patient was provided an opportunity to ask questions and all were answered. The patient agreed with the plan and demonstrated an understanding of the instructions.  I, Kinzie Wickes, PA-C have reviewed all documentation for this visit. The documentation on 05/14/2024  for the exam, diagnosis, procedures, and orders are all accurate and complete.  Jolynn Spencer, [facility], MMS [facility] [phone] (phone) [phone] (fax)  [facility]

## 2024-05-29 LAB — GENECONNECT MOLECULAR SCREEN: Genetic Analysis Overall Interpretation: NEGATIVE

## 2024-05-30 ENCOUNTER — Encounter: Payer: Self-pay | Admitting: Physician Assistant

## 2024-06-01 ENCOUNTER — Telehealth: Payer: Self-pay | Admitting: Pharmacy Technician

## 2024-06-01 ENCOUNTER — Other Ambulatory Visit (HOSPITAL_COMMUNITY): Payer: Self-pay

## 2024-06-01 NOTE — Telephone Encounter (Signed)
 Pharmacy Patient Advocate Encounter   Received notification from Patient Advice Request messages that prior authorization for Wegovy  is required/requested.   Insurance verification completed.   The patient is insured through [employer].   Per test claim: Effective October 1st, Medicaid discontinued coverage of GLP1 medications for weight loss (such as Wegovy  and Zepbound), unless the patient has a documented history of a heart attack or stroke. Zepbound will continue to be covered only for patients with moderate to severe sleep apnea (AHI 15-30) and a BMI greater than 40. Because of this change, the prior authorization team will not be submitting new PA requests for GLP1 medications prescribed for weight loss, as patients will be unable to continue therapy under Medicaid coverage.   Sent msg back to [employer].

## 2024-06-18 ENCOUNTER — Encounter: Payer: Self-pay | Admitting: Physician Assistant

## 2024-06-18 DIAGNOSIS — E66813 Obesity, class 3: Secondary | ICD-10-CM

## 2024-06-18 DIAGNOSIS — R0683 Snoring: Secondary | ICD-10-CM

## 2024-07-06 ENCOUNTER — Ambulatory Visit: Admitting: Sleep Medicine

## 2024-07-12 ENCOUNTER — Encounter: Payer: Self-pay | Admitting: Sleep Medicine

## 2024-07-12 ENCOUNTER — Ambulatory Visit: Admitting: Sleep Medicine

## 2024-07-12 VITALS — BP 124/80 | HR 80 | Temp 99.1°F | Ht 62.0 in | Wt 216.4 lb

## 2024-07-12 DIAGNOSIS — G4733 Obstructive sleep apnea (adult) (pediatric): Secondary | ICD-10-CM | POA: Diagnosis not present

## 2024-07-12 DIAGNOSIS — G4700 Insomnia, unspecified: Secondary | ICD-10-CM

## 2024-07-12 DIAGNOSIS — E669 Obesity, unspecified: Secondary | ICD-10-CM

## 2024-07-12 DIAGNOSIS — F5104 Psychophysiologic insomnia: Secondary | ICD-10-CM

## 2024-07-12 NOTE — Patient Instructions (Signed)
 Mariah Richardson

## 2024-07-12 NOTE — Progress Notes (Signed)
 "      Name:Mariah Richardson MRN: [id number] DOB: August 18, 1985   CHIEF COMPLAINT:  EXCESSIVE DAYTIME SLEEPINESS   HISTORY OF PRESENT ILLNESS: Ms. Baldonado is a 38 y.o. w/ a h/o obesity who present for c/o loud snoring and excessive daytime sleepiness which has been present for several years. Reports nocturnal awakenings due to unclear reasons and has difficulty falling back to sleep. Reports significant weight changes. Admits to dry mouth and morning headaches. Denies RLS symptoms, dream enactment, cataplexy, hypnagogic or hypnapompic hallucinations. Denies a family history of sleep apnea. Denies drowsy driving. Drinks 2 cups of coffee and 3 energy drinks weekly, 5-6 sodas daily, denies alcohol, tobacco or illicit drug use.   Bedtime 11 pm Sleep onset 2-3 hours Rise time 5:30 am   EPWORTH SLEEP SCORE 0    07/12/2024    1:00 PM  Results of the Epworth flowsheet  Sitting and reading 0  Watching TV 0  Sitting, inactive in a public place (e.g. a theatre or a meeting) 0  As a passenger in a car for an hour without a break 0  Lying down to rest in the afternoon when circumstances permit 0  Sitting and talking to someone 0  Sitting quietly after a lunch without alcohol 0  In a car, while stopped for a few minutes in traffic 0  Total score 0    PAST MEDICAL HISTORY :   has a past medical history of Allergy, Gastritis, and Hypertension.  has a past surgical history that includes Appendectomy and Cholecystectomy. Prior to Admission medications  Medication Sig Start Date End Date Taking? Authorizing Provider  metroNIDAZOLE  (FLAGYL ) 500 MG tablet Take 1 tablet (500 mg total) by mouth 2 (two) times daily. 05/11/24   Gladis Elsie BROCKS, PA-C  ondansetron  (ZOFRAN -ODT) 4 MG disintegrating tablet Take 1 tablet (4 mg total) by mouth every 8 (eight) hours as needed for nausea or vomiting. 04/27/24   Ostwalt, Janna, PA-C  semaglutide -weight management (WEGOVY ) 1 MG/0.5ML SOAJ SQ injection Inject 1 mg  into the skin once a week. 05/14/24   Ostwalt, Janna, PA-C   Allergies[1]  FAMILY HISTORY:  family history includes Arthritis in her mother; Asthma in her daughter and son; Cancer in her father, maternal grandmother, and paternal grandmother; Diabetes in her mother; Diverticulitis in her father; Hearing loss in her paternal grandfather; Heart disease in her maternal grandmother, mother, and paternal grandfather; Migraines in her father; Stroke in her maternal grandmother and paternal grandfather. SOCIAL HISTORY:  reports that she has never smoked. She has never used smokeless tobacco. She reports that she does not drink alcohol and does not use drugs.   Review of Systems:  Gen:  Denies  fever, sweats, chills weight loss  HEENT: Denies blurred vision, double vision, ear pain, eye pain, hearing loss, nose bleeds, sore throat Cardiac:  No dizziness, chest pain or heaviness, chest tightness,edema, No JVD Resp:   No cough, -sputum production, -shortness of breath,-wheezing, -hemoptysis,  Gi: Denies swallowing difficulty, stomach pain, nausea or vomiting, diarrhea, constipation, bowel incontinence Gu:  Denies bladder incontinence, burning urine Ext:   Denies Joint pain, stiffness or swelling Skin: Denies  skin rash, easy bruising or bleeding or hives Endoc:  Denies polyuria, polydipsia , polyphagia or weight change Psych:   Denies depression, insomnia or hallucinations  Other:  All other systems negative  VITAL SIGNS: BP 124/80   Pulse 80   Temp 99.1 F (37.3 C)   Ht 5' 2 (1.575 m)   Wt  216 lb 6.4 oz (98.2 kg)   LMP  (LMP Unknown)   SpO2 99%   BMI 39.58 kg/m    Physical Examination:   General Appearance: No distress  EYES PERRLA, EOM intact.   NECK Supple, No JVD Pulmonary: normal breath sounds, No wheezing.  CardiovascularNormal S1,S2.  No m/r/g.   Abdomen: Benign, Soft, non-tender. Skin:   warm, no rashes, no ecchymosis  Extremities: normal, no cyanosis,  clubbing. Neuro:without focal findings,  speech normal  PSYCHIATRIC: Mood, affect within normal limits.   ASSESSMENT AND PLAN  OSA I suspect that OSA is likely present due to clinical presentation. Discussed the consequences of untreated sleep apnea. Advised not to drive drowsy for safety of patient and others. Will complete further evaluation with a home sleep study and follow up to review results.    Obesity Counseled patient on diet and lifestyle modification.   Insomnia Counseled patient on stimulus control and improving sleep hygiene practices.    MEDICATION ADJUSTMENTS/LABS AND TESTS ORDERED: Recommend Sleep Study   Patient  satisfied with Plan of action and management. All questions answered  Follow up to review HST results and treatment plan.   I spent a total of 33 minutes reviewing chart data, face-to-face evaluation with the patient, counseling and coordination of care as detailed above.    Tresa Jolley, M.D.  Sleep Medicine [facility]           [1]  Allergies Allergen Reactions   Amoxicillin  Hives   Latex    Penicillins Hives   "

## 2024-07-12 NOTE — Telephone Encounter (Signed)
 Copied from CRM [id number]. Topic: Clinical - Request for Lab/Test Order >> Jul 12, 2024  2:10 PM Devaughn RAMAN wrote: Reason for CRM: Pt called and stated Dr.Reddy wanted her to do the at home sleep study and due to her insurance it was $275, pt stated she does not have $275 right now and she would like to switch it to a facility/onsite test.

## 2024-07-12 NOTE — Telephone Encounter (Signed)
 Would you rather the patient do the Alice sleep study or in lab sleep study?

## 2024-07-16 ENCOUNTER — Encounter: Payer: Self-pay | Admitting: Sleep Medicine

## 2024-07-19 NOTE — Telephone Encounter (Signed)
 Duplicate message -- answered in other open encounter.

## 2024-07-19 NOTE — Telephone Encounter (Signed)
 I don't know how much is paid by the patient with either. I just know insurance will be billed 3,000.00 for in lab but for out Alice we bill 750.00 for doing HST and 175.00 for provider to read

## 2024-07-20 ENCOUNTER — Encounter: Payer: Self-pay | Admitting: Physician Assistant

## 2024-07-20 NOTE — Telephone Encounter (Signed)
 I recommend patient schedule an appointment to be seen in clinic.

## 2024-07-22 ENCOUNTER — Ambulatory Visit
Admission: RE | Admit: 2024-07-22 | Discharge: 2024-07-22 | Disposition: A | Discharge status: Home / Self Care | Source: Ambulatory Visit

## 2024-07-22 VITALS — BP 119/79 | HR 95 | Temp 98.4°F | Resp 16 | Wt 217.0 lb

## 2024-07-22 DIAGNOSIS — H109 Unspecified conjunctivitis: Secondary | ICD-10-CM

## 2024-07-22 DIAGNOSIS — B9689 Other specified bacterial agents as the cause of diseases classified elsewhere: Secondary | ICD-10-CM | POA: Diagnosis not present

## 2024-07-22 DIAGNOSIS — N898 Other specified noninflammatory disorders of vagina: Secondary | ICD-10-CM

## 2024-07-22 LAB — POCT URINE DIPSTICK
Bilirubin, UA: NEGATIVE
Blood, UA: NEGATIVE
Glucose, UA: NEGATIVE mg/dL
Ketones, POC UA: NEGATIVE mg/dL
Leukocytes, UA: NEGATIVE
Nitrite, UA: NEGATIVE
Protein Ur, POC: NEGATIVE mg/dL
Spec Grav, UA: 1.025
Urobilinogen, UA: 0.2 U/dL
pH, UA: 6

## 2024-07-22 LAB — HIV ANTIBODY (ROUTINE TESTING W REFLEX): HIV Screen 4th Generation wRfx: NONREACTIVE

## 2024-07-22 MED ORDER — MOXIFLOXACIN HCL 0.5 % OP SOLN: 1.0000 [drp] | Freq: Three times a day (TID) | OPHTHALMIC | 0 refills | Status: AC

## 2024-07-22 NOTE — ED Provider Notes (Signed)
 " [facility]    CSN: [id number] Arrival date & time: 07/22/24  1535      History   Chief Complaint Chief Complaint  Patient presents with   SEXUALLY TRANSMITTED DISEASE    Entered by patient   Vaginal Discharge   eye redness     HPI Mariah Richardson is a 39 y.o. female.   HPI  39 year old female with past medical history significant for hypertension, syphilis, chronic migraine, and mixed hyperlipidemia presents for evaluation of vaginal burning and white discharge has been going on for the past week.  No associated urinary symptoms or fever.  Also no odor to the discharge.  She is also been complaining of bilateral eye redness and irritation greater on the left than the right with matting and blurriness to her vision.  She denies any contact with pinkeye symptoms and she denies wearing contacts.  Past Medical History:  Diagnosis Date   Allergy    Gastritis    Hypertension     Patient Active Problem List   Diagnosis Date Noted   Snoring 05/17/2024   History of syphilis 05/17/2024   Annual physical exam 03/14/2024   Allergic rhinitis due to allergen 03/14/2024   Mixed hyperlipidemia 06/28/2023   Prediabetes 06/28/2023   Obesity, class 3 (HCC) 03/08/2022   Sciatica of right side 01/29/2022   Supervision of high risk pregnancy, antepartum 11/06/2020   Chronic migraine 07/30/2019   Biological false positive RPR test 02/28/2017    Past Surgical History:  Procedure Laterality Date   APPENDECTOMY     CHOLECYSTECTOMY      OB History     Gravida  5   Para  4   Term  4   Preterm      AB  1   Living  4      SAB      IAB      Ectopic      Multiple  0   Live Births  4            Home Medications    Prior to Admission medications  Medication Sig Start Date End Date Taking? Authorizing Provider  moxifloxacin (VIGAMOX) 0.5 % ophthalmic solution Place 1 drop into both eyes 3 (three) times daily for 7 days. 07/22/24 07/29/24 Yes Bernardino Ditch, NP  semaglutide -weight management (WEGOVY ) 1 MG/0.5ML SOAJ SQ injection Inject 1 mg into the skin once a week. 05/14/24   Ostwalt, Janna, PA-C    Family History Family History  Problem Relation Age of Onset   Diabetes Mother    Heart disease Mother    Arthritis Mother    Migraines Father    Diverticulitis Father    Cancer Father    Cancer Maternal Grandmother    Stroke Maternal Grandmother    Heart disease Maternal Grandmother    Cancer Paternal Grandmother    Heart disease Paternal Grandfather    Stroke Paternal Grandfather    Hearing loss Paternal Grandfather    Asthma Daughter    Asthma Son     Social History Social History[1]   Allergies   Amoxicillin , Latex, and Penicillins   Review of Systems Review of Systems  Eyes:  Positive for discharge, redness, itching and visual disturbance.  Genitourinary:  Positive for vaginal discharge and vaginal pain. Negative for dysuria, frequency, hematuria and urgency.     Physical Exam Triage Vital Signs ED Triage Vitals  Encounter Vitals Group     BP  Girls Systolic BP Percentile      Girls Diastolic BP Percentile      Boys Systolic BP Percentile      Boys Diastolic BP Percentile      Pulse      Resp      Temp      Temp src      SpO2      Weight      Height      Head Circumference      Peak Flow      Pain Score      Pain Loc      Pain Education      Exclude from Growth Chart    No data found.  Updated Vital Signs BP 119/79 (BP Location: Right Arm)   Pulse 95   Temp 98.4 F (36.9 C) (Oral)   Resp 16   Wt 217 lb (98.4 kg)   LMP 07/06/2024   SpO2 98%   BMI 39.69 kg/m   Visual Acuity Right Eye Distance:   Left Eye Distance:   Bilateral Distance:    Right Eye Near:   Left Eye Near:    Bilateral Near:     Physical Exam Vitals and nursing note reviewed.  Constitutional:      Appearance: Normal appearance. She is not ill-appearing.  Eyes:     Extraocular Movements: Extraocular  movements intact.     Pupils: Pupils are equal, round, and reactive to light.     Comments: Bulbar and labral conjunctiva are erythematous and injected bilaterally.  Normal red light reflex in both eyes.  Pupils equal and reactive and EOM's intact.  Skin:    General: Skin is warm and dry.     Capillary Refill: Capillary refill takes less than 2 seconds.     Findings: No erythema or rash.  Neurological:     General: No focal deficit present.     Mental Status: She is alert and oriented to person, place, and time.      UC Treatments / Results  Labs (all labs ordered are listed, but only abnormal results are displayed) Labs Reviewed  POCT URINE DIPSTICK - Normal  HIV ANTIBODY (ROUTINE TESTING W REFLEX)  CERVICOVAGINAL ANCILLARY ONLY    EKG   Radiology No results found.  Procedures Procedures (including critical care time)  Medications Ordered in UC Medications - No data to display  Initial Impression / Assessment and Plan / UC Course  I have reviewed the triage vital signs and the nursing notes.  Pertinent labs & imaging results that were available during my care of the patient were reviewed by me and considered in my medical decision making (see chart for details).   Patient is a nontoxic-appearing 39 year old female presenting for evaluation of ocular and GYN symptoms as outlined in HPI above.  With regards to her GYN symptoms she is requesting STI testing.  She reports that she just broke up with her boyfriend and is concerned about STIs.  She is describing a white discharge without odor and burning in her vaginal vault.  No UTI symptoms.  She is requesting both vaginal swab and blood work.  She has a history of syphilis and has a chronically positive RPR.  She had testing done in October of this year that showed a positive RPR but had a negative T palladium antibody.  I advised the patient that we will not be retesting her for syphilis at this time given that she has had a  positive result with a negative antibody screen recently.  We will test her for HIV and I will order a cytology swab to assess for GC, NG, trichomoniasis, BV, and yeast. With regards to the patient's eye complaints, she does have erythema and injection of the bulbar and labral conjunctiva bilaterally though it is greater on the left than the right.  Her pupils equal and reactive and EOM's intact.  She has normal red light reflex in both eyes.  She does not wear contacts and she has not been around anyone else with pinkeye symptoms.  She also denies being around small children.  I will treat her for conjunctivitis with Vigamox 3 times daily x 7 days.  Return at home infection control precautions reviewed.   Final Clinical Impressions(s) / UC Diagnoses   Final diagnoses:  Bacterial conjunctivitis of both eyes  Vaginal discharge     Discharge Instructions      Your blood work and cytology swab will be back tomorrow.  If you test positive for any infection you will be contacted by phone and treatment options will be provided.  If your results are negative they will appear in your MyChart.  Instill 1 drop of Vigamox in each eye every 8 hours for the next 7 days for treatment of your conjunctivitis.  Avoid touching your eyes as much as possible.  Wipe down all surfaces, countertops, and doorknobs after the first and second 24 hours on eyedrops.  Wash her face with a clean wash rag to remove any drainage and use a different portion of the wash rag to clean each eye so as to not reinfect yourself.  Return for reevaluation for any new or worsening symptoms.      ED Prescriptions     Medication Sig Dispense Auth. Provider   moxifloxacin (VIGAMOX) 0.5 % ophthalmic solution Place 1 drop into both eyes 3 (three) times daily for 7 days. 3 mL Bernardino Ditch, NP      PDMP not reviewed this encounter.    [1]  Social History Tobacco Use   Smoking status: Never   Smokeless tobacco: Never   Vaping Use   Vaping status: Never Used  Substance Use Topics   Alcohol use: No   Drug use: No     Bernardino Ditch, NP 07/22/24 1640  "

## 2024-07-22 NOTE — ED Triage Notes (Signed)
 Pt c/o vaginal discharge, burning and pelvic pain x 1 week. She also has left eye redness and irritation x 3 days.

## 2024-07-22 NOTE — Discharge Instructions (Addendum)
 Your blood work and cytology swab will be back tomorrow.  If you test positive for any infection you will be contacted by phone and treatment options will be provided.  If your results are negative they will appear in your MyChart.  Instill 1 drop of Vigamox in each eye every 8 hours for the next 7 days for treatment of your conjunctivitis.  Avoid touching your eyes as much as possible.  Wipe down all surfaces, countertops, and doorknobs after the first and second 24 hours on eyedrops.  Wash her face with a clean wash rag to remove any drainage and use a different portion of the wash rag to clean each eye so as to not reinfect yourself.  Return for reevaluation for any new or worsening symptoms.

## 2024-07-25 ENCOUNTER — Ambulatory Visit: Payer: Self-pay | Admitting: Physician Assistant

## 2024-07-28 ENCOUNTER — Encounter

## 2024-07-28 DIAGNOSIS — G4733 Obstructive sleep apnea (adult) (pediatric): Secondary | ICD-10-CM

## 2024-07-29 ENCOUNTER — Telehealth: Payer: Self-pay

## 2024-07-29 NOTE — Telephone Encounter (Signed)
 Informed by front desk staff that Patient is calling in with questions regarding her swab results from 07/22/24. Was informed that the Patient states they have not gotten their results.  When looking into the Patient's chart it appears the cytology results are still in process.   Patient informed and verbalized understanding.

## 2024-08-04 ENCOUNTER — Encounter: Payer: Self-pay | Admitting: Sleep Medicine

## 2024-08-04 DIAGNOSIS — G4733 Obstructive sleep apnea (adult) (pediatric): Secondary | ICD-10-CM

## 2024-08-04 DIAGNOSIS — F5104 Psychophysiologic insomnia: Secondary | ICD-10-CM

## 2024-08-04 NOTE — Telephone Encounter (Signed)
 [employer] states the study is analyzing

## 2024-08-08 NOTE — Progress Notes (Unsigned)
 " Established patient visit  Patient: Mariah Richardson   DOB: [birth year]   38 y.o. Female  MRN: [id number] Visit Date: 08/10/2024  Today's healthcare provider: Jolynn Spencer, PA-C   No chief complaint on file.  Subjective       Discussed the use of AI scribe software for clinical note transcription with the patient, who gave verbal consent to proceed.  History of Present Illness        09/23/2023    9:06 AM 08/11/2023    2:33 PM 07/31/2021    4:22 PM  Depression screen PHQ 2/9  Decreased Interest 0 0 0  Down, Depressed, Hopeless 0 0 0  PHQ - 2 Score 0 0 0  Altered sleeping 0 3 0  Tired, decreased energy 0 0 0  Change in appetite 1 0 0  Feeling bad or failure about yourself  0 0 0  Trouble concentrating 0 0 0  Moving slowly or fidgety/restless 0 0 0  Suicidal thoughts 0 0 0  PHQ-9 Score 1  3  0   Difficult doing work/chores  Not difficult at all      Data saved with a previous flowsheet row definition      09/23/2023    9:06 AM 08/11/2023    2:34 PM 08/11/2023    2:33 PM 07/31/2021    4:22 PM  GAD 7 : Generalized Anxiety Score  Nervous, Anxious, on Edge 0 0 0 0  Control/stop worrying 3 3 3  0  Worry too much - different things 3 3 3  0  Trouble relaxing 3 0 0 0  Restless 0 0 0 0  Easily annoyed or irritable 3 3 3  0  Afraid - awful might happen 0 0 0 0  Total GAD 7 Score 12 9 9  0  Anxiety Difficulty Not difficult at all Not difficult at all Not difficult at all Not difficult at all    Medications: Show/hide medication list[1]  Review of Systems  All other systems reviewed and are negative.  All negative Except see HPI   {Insert previous labs (optional):23779} {See past labs  Heme  Chem  Endocrine  Serology  Results Review (optional):1}   Objective    LMP 07/06/2024  {Insert last BP/Wt (optional):23777}{See vitals history (optional):1}   Physical Exam Vitals reviewed.  Constitutional:      General: She is not in acute distress.    Appearance:  Normal appearance. She is well-developed. She is not diaphoretic.  HENT:     Head: Normocephalic and atraumatic.  Eyes:     General: No scleral icterus.    Conjunctiva/sclera: Conjunctivae normal.  Neck:     Thyroid : No thyromegaly.  Cardiovascular:     Rate and Rhythm: Normal rate and regular rhythm.     Pulses: Normal pulses.     Heart sounds: Normal heart sounds. No murmur heard. Pulmonary:     Effort: Pulmonary effort is normal. No respiratory distress.     Breath sounds: Normal breath sounds. No wheezing, rhonchi or rales.  Musculoskeletal:     Cervical back: Neck supple.     Right lower leg: No edema.     Left lower leg: No edema.  Lymphadenopathy:     Cervical: No cervical adenopathy.  Skin:    General: Skin is warm and dry.     Findings: No rash.  Neurological:     Mental Status: She is alert and oriented to person, place, and time. Mental status is at baseline.  Psychiatric:  Mood and Affect: Mood normal.        Behavior: Behavior normal.     No results found for any visits on 08/10/24.      Assessment and Plan Assessment & Plan     No orders of the defined types were placed in this encounter.   No follow-ups on file.   The patient was advised to call back or seek an in-person evaluation if the symptoms worsen or if the condition fails to improve as anticipated.  I discussed the assessment and treatment plan with the patient. The patient was provided an opportunity to ask questions and all were answered. The patient agreed with the plan and demonstrated an understanding of the instructions.  I, Jonathon Castelo, PA-C have reviewed all documentation for this visit. The documentation on 08/10/2024  for the exam, diagnosis, procedures, and orders are all accurate and complete.  Jolynn Spencer, [facility], MMS [facility] [phone] (phone) [phone] (fax)  [facility]     [1] Outpatient Medications Prior to Visit   Medication Sig   semaglutide -weight management (WEGOVY ) 1 MG/0.5ML SOAJ SQ injection Inject 1 mg into the skin once a week.   No facility-administered medications prior to visit.  "

## 2024-08-09 ENCOUNTER — Ambulatory Visit: Admitting: Sleep Medicine

## 2024-08-10 ENCOUNTER — Ambulatory Visit
Admission: RE | Admit: 2024-08-10 | Discharge: 2024-08-10 | Disposition: A | Discharge status: Home / Self Care | Source: Ambulatory Visit | Attending: Physician Assistant | Admitting: Physician Assistant

## 2024-08-10 ENCOUNTER — Ambulatory Visit: Payer: Self-pay | Admitting: Physician Assistant

## 2024-08-10 ENCOUNTER — Encounter: Payer: Self-pay | Admitting: Physician Assistant

## 2024-08-10 ENCOUNTER — Telehealth (HOSPITAL_COMMUNITY): Payer: Self-pay

## 2024-08-10 ENCOUNTER — Ambulatory Visit: Admitting: Physician Assistant

## 2024-08-10 VITALS — BP 117/61 | HR 90 | Ht 62.0 in | Wt 223.1 lb

## 2024-08-10 DIAGNOSIS — R21 Rash and other nonspecific skin eruption: Secondary | ICD-10-CM | POA: Diagnosis not present

## 2024-08-10 DIAGNOSIS — Z6841 Body Mass Index (BMI) 40.0 and over, adult: Secondary | ICD-10-CM

## 2024-08-10 DIAGNOSIS — R519 Headache, unspecified: Secondary | ICD-10-CM | POA: Insufficient documentation

## 2024-08-10 DIAGNOSIS — E66813 Obesity, class 3: Secondary | ICD-10-CM | POA: Diagnosis not present

## 2024-08-10 DIAGNOSIS — H532 Diplopia: Secondary | ICD-10-CM | POA: Diagnosis not present

## 2024-08-10 DIAGNOSIS — E782 Mixed hyperlipidemia: Secondary | ICD-10-CM

## 2024-08-10 DIAGNOSIS — R29818 Other symptoms and signs involving the nervous system: Secondary | ICD-10-CM

## 2024-08-10 DIAGNOSIS — R0683 Snoring: Secondary | ICD-10-CM

## 2024-08-10 DIAGNOSIS — G4733 Obstructive sleep apnea (adult) (pediatric): Secondary | ICD-10-CM | POA: Diagnosis not present

## 2024-08-10 DIAGNOSIS — G4709 Other insomnia: Secondary | ICD-10-CM

## 2024-08-10 DIAGNOSIS — R7303 Prediabetes: Secondary | ICD-10-CM

## 2024-08-10 NOTE — Telephone Encounter (Signed)
 Pt to return for Nurse Visit to recollect cytology swab from 07/22/24. Pt called asking about results. Still in process. Advised to return for recollect. Verbalized understanding.

## 2024-08-10 NOTE — Telephone Encounter (Signed)
 Per [facility] it is pending Dr. Chase signature

## 2024-08-12 ENCOUNTER — Encounter: Payer: Self-pay | Admitting: Physician Assistant

## 2024-08-12 DIAGNOSIS — G4739 Other sleep apnea: Secondary | ICD-10-CM

## 2024-08-12 MED ORDER — WEGOVY 0.25 MG/0.5ML ~~LOC~~ SOAJ: 0.2500 mg | SUBCUTANEOUS | 1 refills | Status: AC

## 2024-08-13 ENCOUNTER — Ambulatory Visit: Admitting: Physician Assistant

## 2024-08-13 ENCOUNTER — Other Ambulatory Visit (HOSPITAL_COMMUNITY): Payer: Self-pay

## 2024-08-14 ENCOUNTER — Ambulatory Visit
Admission: EM | Admit: 2024-08-14 | Discharge: 2024-08-14 | Disposition: A | Discharge status: Home / Self Care | Attending: Family Medicine | Admitting: Family Medicine

## 2024-08-14 DIAGNOSIS — N898 Other specified noninflammatory disorders of vagina: Secondary | ICD-10-CM | POA: Diagnosis present

## 2024-08-14 NOTE — ED Triage Notes (Signed)
 Patient states that she was notified by our Lab Call Back Nurse on 08/10/24 that her Vaginal Swab specimen was lost and that she needed to come back to recollect that specimen.  Patient is here today to reswab for her vaginal discharge.  Patient denies any new symptoms.

## 2024-08-14 NOTE — Telephone Encounter (Signed)
 Patient returns to urgent care for recollection of vaginal swab. Aptima swab orders placed.

## 2024-08-16 ENCOUNTER — Other Ambulatory Visit (HOSPITAL_COMMUNITY): Payer: Self-pay

## 2024-08-16 ENCOUNTER — Telehealth: Payer: Self-pay | Admitting: Pharmacy Technician

## 2024-08-16 LAB — CERVICOVAGINAL ANCILLARY ONLY
Bacterial Vaginitis (gardnerella): NEGATIVE
Candida Glabrata: NEGATIVE
Candida Vaginitis: NEGATIVE
Chlamydia: NEGATIVE
Comment: NEGATIVE
Comment: NEGATIVE
Comment: NEGATIVE
Comment: NEGATIVE
Comment: NEGATIVE
Comment: NORMAL
Neisseria Gonorrhea: NEGATIVE
Trichomonas: NEGATIVE

## 2024-08-16 NOTE — Telephone Encounter (Signed)
 Pharmacy Patient Advocate Encounter   Received notification from [facility] that prior authorization for Wegovy  0.25MG /0.5ML auto-injectors is required/requested.   Insurance verification completed.   The patient is insured through [employer].   Per test claim: PA required; PA submitted to above mentioned insurance via Latent Key/confirmation #/EOC [id number] Status is pending

## 2024-08-16 NOTE — Telephone Encounter (Signed)
 Pharmacy Patient Advocate Encounter  Received notification from [employer] that Prior Authorization for Wegovy  0.25MG /0.5ML auto-injectors has been DENIED.  Full denial letter will be uploaded to the media tab. See denial reason below.    PA #/Case ID/Reference #: [id number]  **Her weight and BMI from her last office visit on 08/10/24 was included in the PA request**

## 2024-08-18 ENCOUNTER — Encounter: Payer: Self-pay | Admitting: Physician Assistant

## 2024-08-18 ENCOUNTER — Ambulatory Visit: Payer: Self-pay

## 2024-08-18 NOTE — Telephone Encounter (Signed)
 PA request was recently denied. Appeal will be required for approval. Routing to appeals team. Please see denial on chart media.

## 2024-08-18 NOTE — Telephone Encounter (Signed)
 I cannot speak for what insurance told patient. Patient will need to follow up directly with plan.

## 2024-08-19 ENCOUNTER — Telehealth: Payer: Self-pay | Admitting: Pharmacist

## 2024-08-19 NOTE — Telephone Encounter (Signed)
 FYI-- pt would like to hold off on CPAP machine.  Noted, NFN.

## 2024-08-19 NOTE — Telephone Encounter (Signed)
 Appeal has been submitted. Will advise when response is received, please be advised that most companies may take 30 days to make a decision. Appeal letter and all supporting documentation have been sent to insurance via email ( [email or web address]) on 08/19/2024 @11 :02 am.  Thank you, Devere Pandy, PharmD Clinical Pharmacist  [facility]  Direct Dial: [phone]

## 2024-08-23 ENCOUNTER — Encounter: Payer: Self-pay | Admitting: Physician Assistant

## 2024-08-24 ENCOUNTER — Other Ambulatory Visit (HOSPITAL_COMMUNITY): Payer: Self-pay

## 2024-10-08 ENCOUNTER — Ambulatory Visit: Admitting: Physician Assistant

## 2025-03-14 ENCOUNTER — Encounter: Admitting: Physician Assistant
